# Patient Record
Sex: Male | Born: 1960 | Race: Black or African American | Hispanic: No | Smoking: Current every day smoker
Health system: Southern US, Community
[De-identification: ages and names within clinical notes are randomized; demographics above are authoritative.]

## PROBLEM LIST (undated history)

## (undated) ENCOUNTER — Emergency Department (HOSPITAL_BASED_OUTPATIENT_CLINIC_OR_DEPARTMENT_OTHER): Admission: EM | Payer: No Typology Code available for payment source | Source: Home / Self Care

## (undated) DIAGNOSIS — I10 Essential (primary) hypertension: Secondary | ICD-10-CM

## (undated) DIAGNOSIS — Z950 Presence of cardiac pacemaker: Secondary | ICD-10-CM

## (undated) DIAGNOSIS — Z888 Allergy status to other drugs, medicaments and biological substances status: Secondary | ICD-10-CM

## (undated) DIAGNOSIS — G4730 Sleep apnea, unspecified: Secondary | ICD-10-CM

## (undated) DIAGNOSIS — R972 Elevated prostate specific antigen [PSA]: Secondary | ICD-10-CM

## (undated) DIAGNOSIS — I428 Other cardiomyopathies: Secondary | ICD-10-CM

## (undated) DIAGNOSIS — I4719 Other supraventricular tachycardia: Secondary | ICD-10-CM

## (undated) DIAGNOSIS — I456 Pre-excitation syndrome: Secondary | ICD-10-CM

## (undated) DIAGNOSIS — K449 Diaphragmatic hernia without obstruction or gangrene: Secondary | ICD-10-CM

## (undated) DIAGNOSIS — K219 Gastro-esophageal reflux disease without esophagitis: Secondary | ICD-10-CM

## (undated) DIAGNOSIS — I441 Atrioventricular block, second degree: Secondary | ICD-10-CM

## (undated) DIAGNOSIS — I471 Supraventricular tachycardia: Secondary | ICD-10-CM

## (undated) HISTORY — DX: Pre-excitation syndrome: I45.6

## (undated) HISTORY — DX: Other supraventricular tachycardia: I47.19

## (undated) HISTORY — DX: Atrioventricular block, second degree: I44.1

## (undated) HISTORY — DX: Diaphragmatic hernia without obstruction or gangrene: K44.9

## (undated) HISTORY — DX: Allergy status to other drugs, medicaments and biological substances: Z88.8

## (undated) HISTORY — DX: Presence of cardiac pacemaker: Z95.0

## (undated) HISTORY — DX: Supraventricular tachycardia: I47.1

## (undated) HISTORY — DX: Essential (primary) hypertension: I10

## (undated) HISTORY — DX: Other cardiomyopathies: I42.8

---

## 1997-10-31 HISTORY — PX: PACEMAKER INSERTION: SHX728

## 1998-05-18 ENCOUNTER — Observation Stay (HOSPITAL_COMMUNITY): Admission: AD | Admit: 1998-05-18 | Discharge: 1998-05-19 | Payer: Self-pay | Admitting: Cardiology

## 1998-06-01 ENCOUNTER — Emergency Department (HOSPITAL_COMMUNITY): Admission: EM | Admit: 1998-06-01 | Discharge: 1998-06-01 | Payer: Self-pay | Admitting: Emergency Medicine

## 1998-08-14 ENCOUNTER — Emergency Department (HOSPITAL_COMMUNITY): Admission: EM | Admit: 1998-08-14 | Discharge: 1998-08-14 | Payer: Self-pay | Admitting: Emergency Medicine

## 1998-08-14 ENCOUNTER — Encounter: Payer: Self-pay | Admitting: Emergency Medicine

## 1998-12-03 ENCOUNTER — Emergency Department (HOSPITAL_COMMUNITY): Admission: EM | Admit: 1998-12-03 | Discharge: 1998-12-03 | Payer: Self-pay | Admitting: Emergency Medicine

## 1998-12-03 ENCOUNTER — Encounter: Payer: Self-pay | Admitting: Emergency Medicine

## 1999-02-09 ENCOUNTER — Emergency Department (HOSPITAL_COMMUNITY): Admission: EM | Admit: 1999-02-09 | Discharge: 1999-02-09 | Payer: Self-pay | Admitting: Emergency Medicine

## 1999-03-12 ENCOUNTER — Ambulatory Visit (HOSPITAL_BASED_OUTPATIENT_CLINIC_OR_DEPARTMENT_OTHER): Admission: RE | Admit: 1999-03-12 | Discharge: 1999-03-12 | Payer: Self-pay | Admitting: General Surgery

## 2001-04-20 ENCOUNTER — Ambulatory Visit (HOSPITAL_COMMUNITY): Admission: RE | Admit: 2001-04-20 | Discharge: 2001-04-20 | Payer: Self-pay | Admitting: Urology

## 2001-04-20 ENCOUNTER — Encounter: Payer: Self-pay | Admitting: Urology

## 2001-09-28 ENCOUNTER — Encounter: Payer: Self-pay | Admitting: Emergency Medicine

## 2001-09-28 ENCOUNTER — Emergency Department (HOSPITAL_COMMUNITY): Admission: EM | Admit: 2001-09-28 | Discharge: 2001-09-28 | Payer: Self-pay | Admitting: Emergency Medicine

## 2001-09-29 ENCOUNTER — Emergency Department (HOSPITAL_COMMUNITY): Admission: EM | Admit: 2001-09-29 | Discharge: 2001-09-29 | Payer: Self-pay | Admitting: Emergency Medicine

## 2001-10-03 ENCOUNTER — Encounter: Payer: Self-pay | Admitting: Emergency Medicine

## 2001-10-03 ENCOUNTER — Emergency Department (HOSPITAL_COMMUNITY): Admission: EM | Admit: 2001-10-03 | Discharge: 2001-10-03 | Payer: Self-pay | Admitting: Emergency Medicine

## 2001-11-19 ENCOUNTER — Encounter
Admission: RE | Admit: 2001-11-19 | Discharge: 2001-12-16 | Payer: Self-pay | Admitting: Physical Medicine and Rehabilitation

## 2001-12-27 ENCOUNTER — Encounter: Payer: Self-pay | Admitting: Physical Medicine and Rehabilitation

## 2001-12-27 ENCOUNTER — Encounter
Admission: RE | Admit: 2001-12-27 | Discharge: 2001-12-27 | Payer: Self-pay | Admitting: Physical Medicine and Rehabilitation

## 2002-03-29 ENCOUNTER — Encounter: Payer: Self-pay | Admitting: Orthopaedic Surgery

## 2002-04-05 ENCOUNTER — Encounter: Payer: Self-pay | Admitting: Orthopaedic Surgery

## 2002-04-05 ENCOUNTER — Inpatient Hospital Stay (HOSPITAL_COMMUNITY): Admission: RE | Admit: 2002-04-05 | Discharge: 2002-04-08 | Payer: Self-pay | Admitting: Orthopaedic Surgery

## 2002-04-07 ENCOUNTER — Encounter: Payer: Self-pay | Admitting: Orthopaedic Surgery

## 2002-05-22 ENCOUNTER — Encounter: Payer: Self-pay | Admitting: Orthopaedic Surgery

## 2002-05-22 ENCOUNTER — Ambulatory Visit (HOSPITAL_COMMUNITY): Admission: RE | Admit: 2002-05-22 | Discharge: 2002-05-22 | Payer: Self-pay | Admitting: Orthopaedic Surgery

## 2002-05-28 ENCOUNTER — Inpatient Hospital Stay (HOSPITAL_COMMUNITY): Admission: RE | Admit: 2002-05-28 | Discharge: 2002-06-01 | Payer: Self-pay | Admitting: Orthopaedic Surgery

## 2002-05-28 ENCOUNTER — Encounter: Payer: Self-pay | Admitting: Orthopaedic Surgery

## 2002-05-31 ENCOUNTER — Encounter: Payer: Self-pay | Admitting: Orthopaedic Surgery

## 2004-02-06 ENCOUNTER — Emergency Department (HOSPITAL_COMMUNITY): Admission: EM | Admit: 2004-02-06 | Discharge: 2004-02-06 | Payer: Self-pay | Admitting: Emergency Medicine

## 2004-03-18 ENCOUNTER — Inpatient Hospital Stay (HOSPITAL_BASED_OUTPATIENT_CLINIC_OR_DEPARTMENT_OTHER): Admission: RE | Admit: 2004-03-18 | Discharge: 2004-03-18 | Payer: Self-pay | Admitting: Cardiovascular Disease

## 2004-09-07 ENCOUNTER — Ambulatory Visit: Payer: Self-pay | Admitting: Internal Medicine

## 2004-09-08 ENCOUNTER — Ambulatory Visit: Payer: Self-pay | Admitting: Internal Medicine

## 2005-01-18 ENCOUNTER — Ambulatory Visit: Payer: Self-pay | Admitting: Internal Medicine

## 2005-01-24 ENCOUNTER — Ambulatory Visit: Payer: Self-pay | Admitting: Internal Medicine

## 2005-01-26 ENCOUNTER — Ambulatory Visit: Payer: Self-pay

## 2005-02-07 ENCOUNTER — Ambulatory Visit: Payer: Self-pay

## 2005-09-06 ENCOUNTER — Ambulatory Visit: Payer: Self-pay | Admitting: Internal Medicine

## 2005-11-07 ENCOUNTER — Ambulatory Visit: Payer: Self-pay | Admitting: Internal Medicine

## 2005-12-05 ENCOUNTER — Encounter: Payer: Self-pay | Admitting: Cardiology

## 2005-12-05 ENCOUNTER — Ambulatory Visit: Payer: Self-pay

## 2005-12-13 ENCOUNTER — Ambulatory Visit: Payer: Self-pay | Admitting: Internal Medicine

## 2009-10-31 HISTORY — PX: CARDIAC DEFIBRILLATOR PLACEMENT: SHX171

## 2010-02-10 ENCOUNTER — Telehealth (INDEPENDENT_AMBULATORY_CARE_PROVIDER_SITE_OTHER): Payer: Self-pay | Admitting: *Deleted

## 2010-03-02 ENCOUNTER — Ambulatory Visit: Payer: Self-pay | Admitting: Internal Medicine

## 2010-03-02 DIAGNOSIS — I440 Atrioventricular block, first degree: Secondary | ICD-10-CM | POA: Insufficient documentation

## 2010-03-02 DIAGNOSIS — I10 Essential (primary) hypertension: Secondary | ICD-10-CM | POA: Insufficient documentation

## 2010-03-02 DIAGNOSIS — Z950 Presence of cardiac pacemaker: Secondary | ICD-10-CM | POA: Insufficient documentation

## 2010-03-02 DIAGNOSIS — I429 Cardiomyopathy, unspecified: Secondary | ICD-10-CM | POA: Insufficient documentation

## 2010-03-05 ENCOUNTER — Telehealth: Payer: Self-pay | Admitting: Internal Medicine

## 2010-04-27 ENCOUNTER — Ambulatory Visit: Payer: Self-pay | Admitting: Cardiology

## 2010-04-27 ENCOUNTER — Ambulatory Visit (HOSPITAL_COMMUNITY): Admission: RE | Admit: 2010-04-27 | Discharge: 2010-04-27 | Payer: Self-pay | Admitting: Internal Medicine

## 2010-04-27 ENCOUNTER — Ambulatory Visit: Payer: Self-pay

## 2010-05-07 ENCOUNTER — Ambulatory Visit: Payer: Self-pay | Admitting: Internal Medicine

## 2010-05-07 DIAGNOSIS — I441 Atrioventricular block, second degree: Secondary | ICD-10-CM | POA: Insufficient documentation

## 2010-05-10 ENCOUNTER — Ambulatory Visit (HOSPITAL_COMMUNITY): Admission: RE | Admit: 2010-05-10 | Discharge: 2010-05-10 | Payer: Self-pay | Admitting: Internal Medicine

## 2010-05-10 ENCOUNTER — Ambulatory Visit: Payer: Self-pay | Admitting: Internal Medicine

## 2010-05-11 ENCOUNTER — Encounter: Payer: Self-pay | Admitting: Internal Medicine

## 2010-05-24 ENCOUNTER — Ambulatory Visit: Payer: Self-pay

## 2010-05-24 ENCOUNTER — Encounter: Payer: Self-pay | Admitting: Internal Medicine

## 2010-11-28 LAB — CONVERTED CEMR LAB
BUN: 9 mg/dL (ref 6–23)
Basophils Absolute: 0 10*3/uL (ref 0.0–0.1)
Basophils Relative: 0.4 % (ref 0.0–3.0)
CO2: 25 meq/L (ref 19–32)
Calcium: 8.9 mg/dL (ref 8.4–10.5)
Chloride: 110 meq/L (ref 96–112)
Creatinine, Ser: 0.9 mg/dL (ref 0.4–1.5)
Eosinophils Absolute: 0.1 10*3/uL (ref 0.0–0.7)
Eosinophils Relative: 0.9 % (ref 0.0–5.0)
GFR calc non Af Amer: 115.5 mL/min (ref >60)
Glucose, Bld: 99 mg/dL (ref 70–99)
HCT: 41.9 % (ref 39.0–52.0)
Hemoglobin: 14 g/dL (ref 13.0–17.0)
INR: 1 (ref 0.8–1.0)
Lymphocytes Relative: 44.5 % (ref 12.0–46.0)
Lymphs Abs: 3.7 10*3/uL (ref 0.7–4.0)
MCHC: 33.4 g/dL (ref 30.0–36.0)
MCV: 90.4 fL (ref 78.0–100.0)
Monocytes Absolute: 0.6 10*3/uL (ref 0.1–1.0)
Monocytes Relative: 7.3 % (ref 3.0–12.0)
Neutro Abs: 3.9 10*3/uL (ref 1.4–7.7)
Neutrophils Relative %: 46.9 % (ref 43.0–77.0)
Platelets: 275 10*3/uL (ref 150.0–400.0)
Potassium: 3.7 meq/L (ref 3.5–5.1)
Prothrombin Time: 10.5 s (ref 9.7–11.8)
RBC: 4.64 M/uL (ref 4.22–5.81)
RDW: 14.2 % (ref 11.5–14.6)
Sodium: 139 meq/L (ref 135–145)
WBC: 8.2 10*3/uL (ref 4.5–10.5)
aPTT: 26.5 s (ref 21.7–28.8)

## 2010-11-30 ENCOUNTER — Ambulatory Visit: Admit: 2010-11-30 | Payer: Self-pay | Admitting: Internal Medicine

## 2010-12-02 NOTE — Assessment & Plan Note (Signed)
Summary: per check out/saf   CC:  follow up.  Pt still reported some intermittent dizziness and SOB.  He also reports fatigue and some headaches.  History of Present Illness: [name] Fields was seen after a hiatus of 4 years a few months ago. He previously underwent ablation for WPW. He was followed at that time by Dr. Mayford Knife underwent  pacemaker implantation for high-grade heart block. He comes in today for device followup at his device is noncommunicative.  he continues to have dizzy spells.  We undertook an echo recently that demonstrated normal left ventricular function a significant improvement from his previously demonstrated 40%    Current Medications (verified): 1)  Lisinopril 10 Mg Tabs (Lisinopril) .... One By Mouth Daily  Allergies (verified): No Known Drug Allergies  Past History:  Past Medical History: Last updated: 03/02/2010  1. History of Wolfe-Parkinson-White syndrome.  2. Hiatal hernia.  Past Surgical History: Last updated: 03/02/2010  Pacemaker placement in 1999.  Family History: Last updated: 03/02/2010   Father had hypertension.  Mother had breast cancer.  Social History: Last updated: 03/02/2010  The patient is married, self-employed, smokes 1/2 packs of  cigarettes per day and is a social alcohol drinker.  Vital Signs:  Patient profile:   50 year old male Height:      73 inches Weight:      242 pounds BMI:     32.04 Pulse rate:   67 / minute Pulse rhythm:   regular BP sitting:   143 / 91  (left arm) Cuff size:   large  Vitals Entered By: [name] Fields CMA (May 07, 2010 9:17 AM)  Physical Exam  General:  The patient was alert and oriented in no acute distress. HEENT Normal.  Neck veins were flat, carotids were brisk.  Lungs were clear.  Heart sounds were regular without murmurs or gallops.  Abdomen was soft with active bowel sounds. There is no clubbing cyanosis or edema. Skin Warm and dry    Impression & Recommendations:  Problem  # 1:  SECOND DEGREE ATRIOVENTRICULAR HEART BLOCK (ICD-426.13) the patient has symptomatic second-degree heart block with lightheadedness likely to be able as well as fatigue. He is a previously implanted pacemaker that is healed well. We have discussed pacemaker generator replacement and potential risks including but not limited to infection. He understands these risks.  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 One by mouth daily  Problem # 2:  PACEMAKER, PERMANENT (ICD-V45.01) as above Orders: TLB-BMP (Basic Metabolic Panel-BMET) (80048-METABOL) TLB-CBC Platelet - w/Differential (85025-CBCD) TLB-PT (Protime) (85610-PTP) TLB-PTT (85730-PTTL)  Problem # 3:  CARDIOMYOPATHY, SECONDARY (ICD-425.9) there appears to be in normalization of his left ventricular systolic function (hooray ) we'll continue him on lisinopril His updated medication list for this problem includes:    Lisinopril 10 Mg Tabs (Lisinopril) ..... One by mouth daily  Orders: TLB-BMP (Basic Metabolic Panel-BMET) (80048-METABOL) TLB-CBC Platelet - w/Differential (85025-CBCD) TLB-PT (Protime) (85610-PTP) TLB-PTT (85730-PTTL)  Problem # 4:  AV BLOCK, 1ST DEGREE (ICD-426.11) as above His updated medication list for this problem includes:    Lisinopril 10 Mg Tabs (Lisinopril) ..... One by mouth daily  Orders: TLB-BMP (Basic Metabolic Panel-BMET) (80048-METABOL) TLB-CBC Platelet - w/Differential (85025-CBCD) TLB-PT (Protime) (85610-PTP) TLB-PTT (85730-PTTL)

## 2010-12-02 NOTE — Miscellaneous (Signed)
Summary: Device change out  Clinical Lists Changes  Observations: Added new observation of PPM INDICATN: CHB (05/11/2010 9:19) Added new observation of MAGNET RTE: BOL 100 ERI  85 (05/11/2010 9:19) Added new observation of PPMLEADSTAT2: active (05/11/2010 9:19) Added new observation of PPMLEADSER2: (05/11/2010 9:19) Added new observation of PPMLEADMOD2: 438-10 (05/11/2010 9:19) Added new observation of PPMLEADDOI2: 05/18/1998 (05/11/2010 9:19) Added new observation of PPMLEADLOC2: RV (05/11/2010 9:19) Added new observation of PPMLEADSTAT1: active (05/11/2010 9:19) Added new observation of PPMLEADSER1: (05/11/2010 9:19) Added new observation of PPMLEADMOD1: 438-10 (05/11/2010 9:19) Added new observation of PPMLEADDOI1: 05/18/1998 (05/11/2010 9:19) Added new observation of PPMLEADLOC1: RA (05/11/2010 9:19) Added new observation of PPM IMP MD: Sherryl Manges, MD (05/11/2010 9:19) Added new observation of PPM DOI: 05/10/2010 (05/11/2010 9:19) Added new observation of PPM SERL#: [id number]  (05/11/2010 9:19) Added new observation of PPM MODL#: [id number]  (05/11/2010 0:34) Added new observation of PACEMAKERMFG: St Jude  (05/11/2010 9:19) Added new observation of PACEMAKER MD: Sherryl Manges, MD  (05/11/2010 9:19)      PPM Specifications Following MD:  Sherryl Manges, MD     PPM Vendor:  St Jude     PPM Model Number:  [id number]     PPM Serial Number:  [id number] PPM DOI:  05/10/2010     PPM Implanting MD:  Sherryl Manges, MD  Lead 1    Location: RA     DOI: 05/18/1998     Model #: 438-10     Serial #:     Status: active Lead 2    Location: RV     DOI: 05/18/1998     Model #: 438-10     Serial #:     Status: active  Magnet Response Rate:  BOL 100 ERI  85  Indications:  CHB

## 2010-12-02 NOTE — Progress Notes (Signed)
Summary: PT WANTS TO DO PACER CHECK ON PHONE AGAIN  Phone Note Call from Patient Call back at [phone]   Caller: Patient Reason for Call: Talk to Nurse, Talk to Doctor Summary of Call: PT WANTS TO START DOING HIS PACER CHECKS OVER THE PHONE AGAIN AND NEEDS TO KNOW WHAT HE NEEDS TO DO TO GET THE PROCESS STARTED Initial call taken by: Omer Jack,  February 10, 2010 1:26 PM  Follow-up for Phone Call        Schedulers are to call this gentleman with an appointment to see either Dr. Graciela Husbands or Dr. Ladona Ridgel in [place] to re-establish.  He has not been seen since 2007, Follow-up by: Altha Harm, LPN,  February 10, 2010 1:58 PM

## 2010-12-02 NOTE — Assessment & Plan Note (Signed)
Summary: pc2/jss   Visit Type:  pc2   History of Present Illness: [name] Fields is seen after a hiatus of 4 years. We have seen him years ago at which time he underwent ablation for WPW. He was followed at that time by Dr. Mayford Knife who undergo pacemaker implantation for high-grade heart block. He comes in today for device followup at his device is noncommunicative. He has had 3 dizzy spells which have been associated with warmth.    He has complaints of dyspnea on exertion. He undergone catheterization in 2007 by Dr. admission demonstrating non-obstructive coronary disease and a cardiomyopathy his ejection fraction in the 40s. This is attributed to hot long-standing hypertension.  Current Medications (verified): 1)  None  Allergies (verified): No Known Drug Allergies  Past History:  Past Medical History: hypertension Nonischemic cardiomyopathy Obesity first degree AV block and intermittent high-grade heart block Status post pacemaker (Dr. Mayford Knife) WPWstatus post ablation  Past Surgical History: pacemaker  Social History: partially disabled   Review of Systems       full review of systems was negative apart from a history of present illness and past medical history apart from stress anxiety and nowresolving depression   Vital Signs:  Patient profile:   50 year old male Height:      73 inches Weight:      245.75 pounds BMI:     32.54 Pulse rate:   78 / minute Pulse rhythm:   regular Resp:     18 per minute BP sitting:   149 / 97  (right arm) Cuff size:   large  Vitals Entered By: Vikki Ports (Mar 02, 2010 12:47 PM)  Physical Exam  General:  Alert and oriented in no acute distress. HEENT  normal . Neck veins were flat; carotids brisk and full without bruits. No lymphadenopathy. Back without kyphosis. Lungs clear. Heart sounds regular without murmurs or gallops. PMI nondisplaced. Abdomen soft with active bowel sounds without midline pulsation or hepatomegaly. Femoral  pulses and distal pulses intact. Extremities were without clubbing cyanosis or edemaSkin warm and dry. Neurological exam grossly normal;  affect is engaging and depression was denied    Impression & Recommendations:  Problem # 1:  AV BLOCK, 1ST DEGREE (ICD-426.11) the patient has first degree AV block. He also has dyspnea on exertion. It is not clear to me whether the lateral abnormality and or the structural/hemodynamic issues related to his hypertensive heart disease are mostly contributing. We will need to elucidate this; we'll begin with a 2-D echo. His updated medication list for this problem includes:    Lisinopril 10 Mg Tabs (Lisinopril) ..... One by mouth daily  Problem # 2:  CARDIOMYOPATHY, SECONDARY (ICD-425.9) as above His updated medication list for this problem includes:    Lisinopril 10 Mg Tabs (Lisinopril) ..... One by mouth daily  Orders: Echocardiogram (Echo)  Problem # 3:  HYPERTENSIVE CARDIOVASCULAR DISEASE (ICD-402.90) we will begin him on lisinopril at this time. When he comes in for his echo we'll need to check a metabolic profile. No laboratories are available and the last few years according to [facility] records or EMR His updated medication list for this problem includes:    Lisinopril 10 Mg Tabs (Lisinopril) ..... One by mouth daily  Orders: Echocardiogram (Echo)  Problem # 4:  PACEMAKER, PERMANENT (ICD-V45.01) his pacemaker has lost all of his energy. It is not a device with which we can communicate. His tinnitus undoubtedly for some time. The patient has had a couple  of episodes of lightheadedness. It is [place] reasonable to consider reimplantation. Next issue will be does he have left ventricular function issues sufficient to justify ICD implantation if they were to persist following reinitiation of medical therapy. We will plan to revisit this following his echo  Patient Instructions: 1)  Your physician recommends that you schedule a follow-up appointment in: 2-3  weeks with Dr Graciela Husbands 2)  Your physician has recommended you make the following change in your medication: start Lisinopril 10mg  daily  3)  Your physician has requested that you have an echocardiogram.  Echocardiography is a painless test that uses sound waves to create images of your heart. It provides your doctor with information about the size and shape of your heart and how well your heart's chambers and valves are working.  This procedure takes approximately one hour. There are no restrictions for this procedure. Prescriptions: LISINOPRIL 10 MG TABS (LISINOPRIL) one by mouth daily  #30 x 11   Entered by:   Dennis Bast, RN, BSN   Authorized by:   Nathen May, MD, [facility]   Signed by:   Dennis Bast, RN, BSN on 03/02/2010   Method used:   Electronically to        [facility]  [phone]* (retail)       [address]       [place], [place]  [address]       Ph: [phone] or [phone]       Fax: [phone]   RxID:   [id number]

## 2010-12-02 NOTE — Procedures (Signed)
Summary: wh per Dr. Graciela Husbands call/g   Current Medications (verified): 1)  Lisinopril 10 Mg Tabs (Lisinopril) .... One By Mouth Daily  Allergies (verified): No Known Drug Allergies  PPM Specifications Following MD:  Sherryl Manges, MD     PPM Vendor:  St Jude     PPM Model Number:  [id number]     PPM Serial Number:  [id number] PPM DOI:  05/10/2010     PPM Implanting MD:  Sherryl Manges, MD  Lead 1    Location: RA     DOI: 05/18/1998     Model #: 438-10     Serial #:     Status: active Lead 2    Location: RV     DOI: 05/18/1998     Model #: 438-10     Serial #:     Status: active  Magnet Response Rate:  BOL 100 ERI  85  Indications:  CHB   PPM Follow Up Remote Check?  No Battery Voltage:  3.17 V     Battery Est. Longevity:  9.9 years     Pacer Dependent:  No       PPM Device Measurements Atrium  Amplitude: 4.0 mV, Impedance: 360 ohms, Threshold: 0.5 V at 0.5 msec Right Ventricle  Amplitude: 11.4 mV, Impedance: 350 ohms, Threshold: 0.75 V at 0.5 msec  Episodes MS Episodes:  0     Percent Mode Switch:  0     Coumadin:  No Ventricular High Rate:  2     Atrial Pacing:  2.5%     Ventricular Pacing:  99%  Parameters Mode:  DDD     Lower Rate Limit:  60     Upper Rate Limit:  130 Paced AV Delay:  180     Sensed AV Delay:  150 Next Cardiology Appt Due:  07/31/2010 Tech Comments:  Steri-strips removed by the patient, no redness or edema noted.  ROV 3 months with Dr. Graciela Husbands. Altha Harm, LPN  May 24, 2010 10:28 AM

## 2010-12-02 NOTE — Cardiovascular Report (Signed)
Summary: Office Visit   Office Visit   Imported By: Roderic Ovens 05/28/2010 10:22:01  _____________________________________________________________________  External Attachment:    Type:   Image     Comment:   External Document

## 2010-12-02 NOTE — Progress Notes (Signed)
Summary: medication question  Phone Note Call from Patient Call back at Home Phone [phone]   Caller: Patient Summary of Call: Pt have question about Lisinopril Initial call taken by: Judie Grieve,  Mar 05, 2010 2:52 PM  Follow-up for Phone Call        pt asking if taking Lisinopril may affect his sex drive--I told pt usually  this was not a side effect of this category of medications

## 2010-12-16 ENCOUNTER — Encounter (INDEPENDENT_AMBULATORY_CARE_PROVIDER_SITE_OTHER): Payer: Self-pay | Admitting: Internal Medicine

## 2010-12-16 ENCOUNTER — Encounter: Payer: Self-pay | Admitting: Internal Medicine

## 2010-12-16 DIAGNOSIS — I471 Supraventricular tachycardia, unspecified: Secondary | ICD-10-CM

## 2010-12-16 DIAGNOSIS — R42 Dizziness and giddiness: Secondary | ICD-10-CM

## 2010-12-16 DIAGNOSIS — I10 Essential (primary) hypertension: Secondary | ICD-10-CM

## 2010-12-16 DIAGNOSIS — I495 Sick sinus syndrome: Secondary | ICD-10-CM

## 2010-12-16 DIAGNOSIS — Z950 Presence of cardiac pacemaker: Secondary | ICD-10-CM

## 2010-12-28 NOTE — Cardiovascular Report (Signed)
Summary: Office Visit   Office Visit   Imported By: Roderic Ovens 12/22/2010 16:16:42  _____________________________________________________________________  External Attachment:    Type:   Image     Comment:   External Document

## 2010-12-28 NOTE — Assessment & Plan Note (Signed)
Summary: pacer/office visit ck/mt   Visit Type:  Pacer check   History of Present Illness: [name] is seen in followup for high-grade heart block for which he is status post pacemaker insertion with generator replacement last year   He previously underwent ablation for WPW   We undertook an echo recently that demonstrated normal left ventricular function a significant improvement from his previously demonstrated 40%  he continues to have some dizzy spells. Otherwise he denies chest pain or shortness of breath or peripheral edema    Current Medications (verified): 1)  Lisinopril 10 Mg Tabs (Lisinopril) .... One By Mouth Daily  Allergies (verified): No Known Drug Allergies  Past History:  Past Medical History: Last updated: 08/23/2010  1. History of Wolfe-Parkinson-White syndrome.  2. Hiatal hernia.  3. Implanted-St. Jude Accent RF pulse generator, model [id number]  Family History: Reviewed history from 03/02/2010 and no changes required.   Father had hypertension.  Mother had breast cancer.  Social History: Reviewed history from 03/02/2010 and no changes required.  The patient is married, self-employed, smokes 1/2 packs of  cigarettes per day and is a social alcohol drinker.  Vital Signs:  Patient profile:   50 year old male Height:      73 inches Weight:      254.50 pounds BMI:     33.70 Pulse rate:   75 / minute Pulse rhythm:   regular Resp:     18 per minute BP sitting:   146 / 95  (right arm) Cuff size:   large  Vitals Entered By: Vikki Ports (December 16, 2010 12:18 PM)  Physical Exam  General:  The patient was alert and oriented in no acute distress. HEENT Normal.  Neck veins were flat, carotids were brisk.  Lungs were clear.  Heart sounds were regular without murmurs or gallops.  Abdomen was soft with active bowel sounds. There is no clubbing cyanosis or edema. Skin Warm and dry device pocket is well-healed   PPM Specifications Following MD:   Sherryl Manges, MD     PPM Vendor:  St Jude     PPM Model Number:  [id number]     PPM Serial Number:  [id number] PPM DOI:  05/10/2010     PPM Implanting MD:  Sherryl Manges, MD  Lead 1    Location: RA     DOI: 05/18/1998     Model #: 438-10     Serial #:     Status: active Lead 2    Location: RV     DOI: 05/18/1998     Model #: 438-10     Serial #:     Status: active  Magnet Response Rate:  BOL 100 ERI  85  Indications:  CHB   PPM Follow Up Pacer Dependent:  No      Episodes Coumadin:  No  Parameters Mode:  DDD     Lower Rate Limit:  60     Upper Rate Limit:  130 Paced AV Delay:  180     Sensed AV Delay:  150  Impression & Recommendations:  Problem # 1:  DIZZINESS (ICD-780.4) the patient has episodes of dizziness. His pacemaker also has evidence of ventricular high rate episodes. The m evaluation demonstrates that these are supraventricular origin. They are long RP in mechanism. However, the atrial electrogram is different from sinus suggesting an alternative origin. It is not clear whether these correlate with his dizziness. We will plan to review this. He will make  a dizziness lock and will compare with his arrhythmia log  Problem # 2:  SVT (ICD-427.0) as above; we will add diltiazem to his medical regime both for his blood pressure as well as for his tachycardia. His updated medication list for this problem includes:    Lisinopril 10 Mg Tabs (Lisinopril) ..... One by mouth daily    Cardizem La 120 Mg Xr24h-tab (Diltiazem hcl coated beads) .Marland Kitchen... 1  once daily  Problem # 3:  HYPERTENSIVE CARDIOVASCULAR DISEASE (ICD-402.90) his blood pressure remains elevated. We'll add a calcium blocker. It may well be a beta blocker is better we'll try this first. His updated medication list for this problem includes:    Lisinopril 10 Mg Tabs (Lisinopril) ..... One by mouth daily    Cardizem La 120 Mg Xr24h-tab (Diltiazem hcl coated beads) .Marland Kitchen... 1  once daily  Problem # 4:  PACEMAKER,  PERMANENT (ICD-V45.01) Device parameters and data were reviewed and no changes were made  Patient Instructions: 1)  Your physician recommends that you schedule a follow-up appointment in: 3 MONTHS WITH DR Graciela Husbands 2)  Your physician has recommended you make the following change in your medication: START CARDIZEM 120 MG once daily  Prescriptions: CARDIZEM LA 120 MG XR24H-TAB (DILTIAZEM HCL COATED BEADS) 1  once daily  #30 x 11   Entered by:   Scherrie Bateman, LPN   Authorized by:   Nathen May, MD, [facility]   Signed by:   Scherrie Bateman, LPN on 14/78/2956   Method used:   Electronically to        [facility]  [phone]* (retail)       [address]       [place], [place]  [address]       Ph: [phone] or [phone]       Fax: [phone]   RxID:   [id number]

## 2011-01-04 ENCOUNTER — Telehealth: Payer: Self-pay | Admitting: Internal Medicine

## 2011-01-11 NOTE — Progress Notes (Signed)
Summary: pt has a medication question  Phone Note Call from Patient Call back at Home Phone [phone]   Caller: Patient Reason for Call: Talk to Nurse, Talk to Doctor Summary of Call: pt has a medication question Initial call taken by: Omer Jack,  January 04, 2011 1:38 PM  Follow-up for Phone Call        [facility] Scherrie Bateman, LPN  January 04, 980 5:26 PM  PT COMPLAINING NEW MED IS BOTHERING "SEX LIFE" PT AWARE WILL DISCUSS WITH DR Graciela Husbands. Follow-up by: Scherrie Bateman, LPN,  January 05, 2011 9:01 AM  Additional Follow-up for Phone Call Additional follow up Details #1::        have him stop and let us know  It was diltizam Additional Follow-up by: Nathen May, MD, [facility],  January 05, 2011 6:36 PM     Appended Document: pt has a medication question PT AWARE .Zack Seal

## 2011-01-16 LAB — SURGICAL PCR SCREEN: Staphylococcus aureus: POSITIVE — AB

## 2011-03-14 ENCOUNTER — Encounter: Payer: Self-pay | Admitting: Internal Medicine

## 2011-03-18 ENCOUNTER — Encounter: Payer: Self-pay | Admitting: Internal Medicine
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
## 2011-03-18 NOTE — H&P (Signed)
NAME:  [name] [name], [name] [name] NO.:  [id number]   MEDICAL RECORD NO.:  [id number]                   PATIENT TYPE:  INP   LOCATION:  0345                                 FACILITY:  [facility]   PHYSICIAN:  Patricia Nettle, M.D.                  DATE OF BIRTH:  Dec 04, 1960   DATE OF ADMISSION:  05/28/2002  DATE OF DISCHARGE:  06/01/2002                                HISTORY & PHYSICAL   CHIEF COMPLAINT:  Neck pain.   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  On a followup visit he was seen  by Dr. Sharolyn Douglas, and at his six week visit he was noted on x-ray to have  substance of the thecal strut graft.  Due to these findings, a posterior  cervical effusion with bilateral screws and interspinous wiring.  Supplemental posterior allograft bone graft was recommended.  The risks and  benefits were discussed with the patient in great detail, and he wished to  proceed with operative intervention.   ALLERGIES:  No known drug allergies.   MEDICATIONS:  1. __________1 mg.  2. Pain medication and muscle relaxor from previous surgery.   PAST MEDICAL HISTORY:  1. History of Wolfe-Parkinson-White syndrome.  2. Hiatal hernia.   PAST SURGICAL HISTORY:  Pacemaker placement in 1999.   FAMILY HISTORY:  Father had hypertension.  Mother had breast cancer.   SOCIAL HISTORY:  The patient is married, self-employed, smokes 1/2 packs of  cigarettes per day and is a social alcohol drinker.   REVIEW OF SYMPTOMS:  GENERAL:  He denies any fevers, chills, or night  sweats.  HEENT:  Denies blurred or double vision.  CHEST:  He denies any  hemoptysis or productive cough.  CARDIOVASCULAR:  He denies any type of  chest pains, orthopnea.  GI/GU:  He denies nausea, vomiting, diarrhea,  constipation, melena, or bloody stools.  MUSCULOSKELETAL:  He  states he does  have some neck pain as expected after the surgery which radiates into the  left arm at times when he is trying to pull it from a laying position.   PHYSICAL EXAMINATION:  VITAL SIGNS:  Blood pressure 134/90, pulse 92,  respiratory rate 16, temperature 99.1.  HEENT:  Head is normocephalic, atraumatic.  CHEST:  Clear to auscultation.  No wheezing or rhonchi.  ABDOMEN:  Soft, bowel sounds positive, no guarding, no rebound.  EXTREMITIES:  Including neck examination, the patient remains neurologically  intact.  He has no focal deficits.  Preoperative symptoms as listed in H&P  on 04/22/02, have resolved.  Status post the corpectomies, and he is not  having any dysphagia.   ASSESSMENT:  1. Status post C4-5 corpectomy with fibular strut graft for spinal stenosis  with substance of the fibular strut graft.  2. History of Wolfe-Parkinson-White syndrome with pacemaker placement.  3. History of hiatal hernia.   PLAN:  The patient will be admitted to [facility] and will undergo  a posterior cervical fusion and bilateral screws, bilateral wiring,  supplemented with posterior iliac crest bone graft per Dr. Sharolyn Douglas.  This  has been discussed with the patient in great detail.      Arlys Kameron L. Wynona Neat, P.A.-C.                Patricia Nettle, M.D.    EAV/WUJW  D:  07/06/2002  T:  07/06/2002  Job:  [id number]

## 2011-03-18 NOTE — Discharge Summary (Signed)
NAME:  [name] [name], [name] [name] NO.:  [id number]   MEDICAL RECORD NO.:  [id number]                   PATIENT TYPE:   LOCATION:                                       FACILITY:   PHYSICIAN:  [name] Douglas, MD                       DATE OF BIRTH:  [birth year]   DATE OF ADMISSION:  05/28/2002  DATE OF DISCHARGE:  06/01/2002                                 DISCHARGE SUMMARY   PRIMARY DIAGNOSES:  1. Status post C4-5 anterior cervical corpectomy with fibular strut graft     and plate.  2. 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  3. History of arrhythmia with pacemaker.   SECONDARY DIAGNOSES:  None.   PROCEDURE:  1. Posterior spinal fusion C3-C6.  2. Posterior cervical instrumentation, segmental, C3-C6 with lateral mass     screws and interspinous wiring.  3. Left posterior iliac crest bone graft, morselized.  4. Neural monitoring utilizing SSEPs free running and trigger EMGs.  5. Allograft.  6. Placement and removal of Mayfield tongs all performed by Dr. Noel [name] with     the assistance of [name] [name], P.A.-C. on May 28, 2002.  Please see     operative summary for further details.   CONSULTATIONS:  [name].   LABORATORY DATA:  EKG from July 25 showed normal sinus rhythm with a first  degree AV block, electronic pacemaker.  Urinalysis preoperatively showed  rare epithelial cells, negative nitrite, negative leukocyte esterase, 0-2  red blood cells, 0-2 white blood cells.  H&H preoperative of 14.4 and 41.8,  respectively.  PT/INR and PTT were all within normal limits.  Routine  chemistry preoperatively as well as postoperatively were all within normal  parameters with the exception of a slightly elevated glucose of 138 at  preoperative and then a BUN of 4 postoperative.   CHIEF COMPLAINT:  Neck pain.   HISTORY OF PRESENT ILLNESS:  The patient is a 50 year old male who is seven  weeks status post C4-5  corpectomy who is noted to have on x-ray __________  of the fibular strut on graft.  It was recommended he undergo repeat surgery  on his neck.  He is admitted on July 29 for surgical intervention.   HOSPITAL COURSE:  Following surgical procedure patient was taken to the PACU  in stable condition, transferred to the ICU in stable condition.  He was  then transferred to telemetry floor on postoperative day one.  Wound was  examined on postoperative day two and subsequently found to be clean, dry,  and intact without signs or symptoms of infection.  __________ received  routine postoperative pain medications and antibiotics.  [facility]  was consulted for concurrent medical care.  The patient did well during his  hospital stay and did well with physical therapy.  By postoperative day four  on June 01, 2002 he was stable and ready for discharge home.   DISPOSITION:  The patient is being discharged home.  He is to follow a  regular diet.  Follow up with Dr. Noel [name] in 10-14 days.  He may shower with  the [place] collar once daily.  Where the Aspen collar at all other  times.   DISCHARGE MEDICATIONS:  1. Percocet 5/325 one or two q.4-6h. as needed for pain.  2. Robaxin 500 mg p.o. q.8h. p.r.n. spasm.  3. Over-the-counter stool softener.   CONDITION ON DISCHARGE:  Good and improved.     [name] [name], P.A.-C.              [name] Douglas, MD    ADT/MEDQ  D:  08/07/2002  T:  08/07/2002  Job:  [id number]

## 2011-03-18 NOTE — Op Note (Signed)
TNAMERECO, SHONK NO.:  [id number]   MEDICAL RECORD NO.:  [id number]                   PATIENT TYPE:  INP   LOCATION:  0345                                 FACILITY:  [facility]   PHYSICIAN:  Patricia Nettle, M.D.                  DATE OF BIRTH:  06-28-1961   DATE OF PROCEDURE:  05/28/2002  DATE OF DISCHARGE:                                 OPERATIVE REPORT   DIAGNOSES:  1. Status post C4 and C5 anterior cervical corpectomy with fibular strut     graft and plate.  2. Ossification of posterior longitudinal ligature and cervical spinal     stenosis.  3. History of arrhythmia with pacemaker.   PROCEDURE:  1. Posterior spinal fusion, C3-C6.  2. Posterior cervical instrumentation, segmental, C3-C6 with lateral mass     screws and interspinous wiring.  3. Left posterior iliac crest bone graft, morselized.  4. Neuromonitoring utilizing SSEPs, free running, and trigger EMGs.  5. Allograft.  6. Placement and removal of Mayfield tongs.   SURGEON:  Patricia Nettle, M.D.   ASSISTANT:  Ottie Glazier. Wynona Neat, P.A.-C.   ESTIMATED BLOOD LOSS:  100 cc.   COMPLICATIONS:  None.   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  Last week on his six week  follow-up he was noted on x-ray to have subsidence of the fibular strut  graft.  Posterior cervical fusion with bilateral mass screws and  interspinous wiring supplemented with posterior iliac crest bone graft was  recommended.  The risks, benefits, and alternatives were reviewed with the  patient extensively and he elected to proceed.  A preoperative CT scan was  obtained which showed subsidence of the graft inferiorly into the C6 body  without penetration into the C6-7 disk space.  Although there had been some  change in position of the anterior plate, the CT scan  showed the screws to  be well fixed in bone.  The patient's neurologic exam remained nonfocal.  His preoperative symptoms before the corpectomies have resolved.  He is not  having any dysphagia.   DESCRIPTION OF PROCEDURE:  The patient was properly identified in the  holding area, taken to the operating room where he underwent general  endotracheal anesthesia without difficulty.  We avoided hyperextension of  his neck.  He was given prophylactic IV antibiotics.  The scalp was prepped  in a standard fashion and then the Mayfield tongs were placed and tightened  to 60 pounds.  Neuromonitoring was established in the form of SSEPs as well  as free-running EMGs.  The patient was then turned prone and carefully  positioned on a [employer] four-poster positioning frame.  The Mayfield was  attached to the bed.  We positioned  his neck in neutral.  Fluoroscopy was  utilized to confirm acceptable positioning and if the anterior graft were in  place.  We then prepped the neck and left iliac crest in a standard fashion.  An 8 cm incision was made from C2-C7.  Dissection was carried down to the  deep fascia.  The dissection was taken down to the spinous processes by  staying within the nuchal ligament.  The paraspinous muscles were then  stripped out to the lateral masses, exposing C3, 4, 5, and 6.  Care was  taken to protect the C2-3 and C6-7 facette capsules.  Deep retractors were  placed.  At this point the facette joints were curetted of cartilage on  their posterior one-thirds.  We then used a burr to decorticate the  facettes.  Lateral mass screws were then placed using a standard technique  by first using the awl and then the high-speed drill.  A depth gauge was  used.  We achieved bicortical fixation at C3, unicortical fixation at C4-5,  and then bicortical fixation again at C6.  We placed 12 and 14 mm Summit  polyaxial screws.   At this point attention was directed to the left posterior iliac  crest.  The  3 cm incision was made directly over the crest.  Dissection was carried down  to the deep fascia.  The deep fascia was incised and the PSIS was exposed.  We used a Lexxel to remove the cap and then used  curettes to remove copious  amounts of autologous iliac crest cancellous graft.  The wound was  irrigated.  Gelfoam was placed in the defect.  The deep fascia was closed  with a running #1 Vicryl suture followed by 2-0 Vicryl in the skin and then  a  4-0 subcuticular Vicryl, Benzoin, and Steri-Strips.   Attention was directed back to the neck.  We packed autologous iliac crest  bone graft into the facette joint at C3-4, C4-5, and C5-6 bilaterally.  We  then placed rods and appropriate locking caps.  At this point we used the  high-speed burr to decorticate the lamina at C-3, 4, 5, and 6, as well as  the lateral masses.  We then packed the remaining bone graft tightly on top  of the lamina and over the lateral masses between the screws.  At this point  a burr was used to make a hole at the spinolaminar junction of C3.  We  connected this with a towel clip.  We then took a Songer cable, titanium,  and wired this through with a coil in the C3 spinous process and then around  the C6 spinous process through the interspinous ligament between C6 and C7.  We then tensioned this to 12 pounds of torque and crimped.  We then packed  10 cc of AlloMatrix allograft over the spinous processes and lamina.  At  this point the wound was irrigated.  We closed the muscle and fascia using  interrupted #1 Vicryl suture.  The subcutaneous layer was closed using 2-0  interrupted Vicryl and then the subcuticular layer closed with a running 4-0  subcuticular Vicryl suture.  We placed Benzoin and  Steri-Strips.  Before closing the fascia a deep Hemovac drain was placed.  It should also be noted that throughout the procedure there were no deleterious changes in the free-running EMGs or the SSEPs.  After  placing  the lateral mass screws we performed triggered EMGs confirming intraosseous  placement of the  screws without contact of the exiting roots.  A sterile  dressing was applied.  The patient was carefully turned supine.  The  Mayfield tongs were removed.  The pin sites were inspected.  There was no  bleeding.  His Aspen collar was then placed.  We took a lateral radiograph  showing good position of the hardware C3-C6 with no change in the anterior  fibular strut or plate.  He had good strength in his upper and lower  extremities and was transferred to the recovery room in stable condition.                                               Patricia Nettle, M.D.    MWC/MEDQ  D:  05/28/2002  T:  05/31/2002  Job:  [id number]

## 2011-03-18 NOTE — Cardiovascular Report (Signed)
NAME:  [name] [name], [name] [name] NO.:  [id number]   MEDICAL RECORD NO.:  [id number]                   PATIENT TYPE:  AMB   LOCATION:                                       FACILITY:  [facility]   PHYSICIAN:  Charlton Haws, M.D.                  DATE OF BIRTH:  [birth year]   DATE OF PROCEDURE:  03/18/2004  DATE OF DISCHARGE:                              CARDIAC CATHETERIZATION   PROCEDURE:  Left coronary angiography.   INDICATIONS FOR PROCEDURE:  Chest pain in an individual with decreased LV  function by Cardiolite at 43%.  Chronic left bundle branch block, rule out  cardiomyopathy of an ischemic nature.   ANGIOGRAPHY:  The patient was sedated with 3 mg of Versed and  catheterization was done from the right femoral artery.   1. The left main coronary artery was normal.  2. Left anterior descending artery was normal.  3. Circumflex coronary artery was normal.  4. Right coronary artery was normal.  5. There is no evidence of critical coronary disease.  .   VENTRICULOGRAM:  RAO ventriculography showed an LV pressure of 145/11 and an  aortic pressure of 142/90.  There was mild global hypokinesis with normal  mural thrombus.  There was no significant MR.  Ejection fraction was  estimated at 45%.   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  He will follow up with Dr. Graciela Husbands and Dr.  Posey Rea.  His pacemaker is working well. He is status post ablation for  WPW.  Consideration for increasing his current Diovan dose will be given in  the future.   The patient will be discharged later today so long as his leg heals well.                                               Charlton Haws, M.D.    PN/MEDQ  D:  03/18/2004  T:  03/19/2004  Job:  [id number]   cc:   Duke Salvia, M.D.   Dr. Charm Barges
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
## 2011-03-18 NOTE — Op Note (Signed)
[facility]  Patient:    ARIV, PENROD Visit Number: [id number] MRN: [id number]          Service Type: SUR Location: 4W 0446 01 Attending Physician:  Patricia Nettle Proc. Date: 04/05/02 Admit Date:  04/05/2002 Discharge Date: 04/08/2002                             Operative Report  ADDENDUM:  It should be noted that at the completion of the spinal cord decompression, the dura was carefully evaluated and there were no dural tears. Nevertheless, we created a barrier with a small piece of Duragen, carefully contouring this and laying it over the exposed epidural space.  I also failed to mention that after the strut graft was placed, we had the anesthesiologist flex and extend the neck and directly observe the graft.  It was found to be stable and there was no tendency for it to displace. Attending Physician:  Patricia Nettle. DD:  04/06/02 TD:  04/09/02 Job: [id number]
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
## 2011-04-13 ENCOUNTER — Encounter: Payer: Self-pay | Admitting: Internal Medicine

## 2011-04-26 ENCOUNTER — Encounter: Payer: Self-pay | Admitting: Internal Medicine

## 2011-06-01 ENCOUNTER — Encounter: Payer: Self-pay | Admitting: Internal Medicine

## 2011-06-01 ENCOUNTER — Other Ambulatory Visit: Payer: Self-pay | Admitting: Internal Medicine

## 2011-06-01 ENCOUNTER — Ambulatory Visit (INDEPENDENT_AMBULATORY_CARE_PROVIDER_SITE_OTHER): Payer: Self-pay | Admitting: Internal Medicine

## 2011-06-01 DIAGNOSIS — R0602 Shortness of breath: Secondary | ICD-10-CM

## 2011-06-01 DIAGNOSIS — R079 Chest pain, unspecified: Secondary | ICD-10-CM | POA: Insufficient documentation

## 2011-06-01 DIAGNOSIS — I440 Atrioventricular block, first degree: Secondary | ICD-10-CM

## 2011-06-01 DIAGNOSIS — Z950 Presence of cardiac pacemaker: Secondary | ICD-10-CM

## 2011-06-01 DIAGNOSIS — I429 Cardiomyopathy, unspecified: Secondary | ICD-10-CM

## 2011-06-01 DIAGNOSIS — I441 Atrioventricular block, second degree: Secondary | ICD-10-CM

## 2011-06-01 MED ORDER — LISINOPRIL 10 MG PO TABS: 10.0000 mg | ORAL_TABLET | Freq: Every day | ORAL | Status: DC

## 2011-06-01 MED ORDER — VERAPAMIL HCL ER 180 MG PO TBCR: 180.0000 mg | EXTENDED_RELEASE_TABLET | Freq: Every day | ORAL | Status: DC

## 2011-06-01 NOTE — Assessment & Plan Note (Signed)
The patient's device was interrogated.  The information was reviewed. No changes were made in the programming.  Attempts were made to increase  AV delay, but he persists in pacing even at 250 msec

## 2011-06-01 NOTE — Progress Notes (Signed)
  HPI  [name] Fields is a 50 y.o. male e previously underwent ablation for WPW. He was followed at that time by Dr. Mayford Knife and underwent pacemaker implantation for high-grade heart block. His pacemaker generator was replaced July 2011. He's had occasional issues with palpitations since then. There has been no syncope. .  We undertook an echo recently that demonstrated normal left ventricular function a significant improvement from his previously demonstrated 40%   He also has been complaining of recently identified significant shortness of breath associated with ejaculation. This is the most physically active he is. It is unassociated with chest pain.   Past Medical History  Diagnosis Date  . WPW (Wolff-Parkinson-White syndrome)     s/p ICD  . Hiatal hernia     Past Surgical History  Procedure Date  . Pacemaker insertion 1999    explanted 2011  . Cardiac defibrillator placement 2011    [employer] Jude -- [employer] RF    Current Outpatient Prescriptions  Medication Sig Dispense Refill  . lisinopril (PRINIVIL,ZESTRIL) 10 MG tablet Take 10 mg by mouth daily.          Allergies no known allergies  Review of Systems negative except from HPI and PMH  Physical Exam Well developed and well nourished in no acute distress HENT normal E scleral and icterus clear Neck Supple Device pocket well-healed Clear to ausculation Regular rate and rhythm, no murmurs gallops or rub Soft with active bowel sounds No clubbing cyanosis and edema Alert and oriented, grossly normal motor and sensory function Skin Warm and Dry   Assessment and  Plan

## 2011-06-01 NOTE — Patient Instructions (Addendum)
Your physician has recommended you make the following change in your medication:  1) Start Verapamil 180mg  one tablet by mouth once daily.  Your physician has requested that you have en exercise stress myoview. For further information please visit [email or web address] Please follow instruction sheet, as given.  Remote monitoring is used to monitor your Pacemaker of ICD from home. This monitoring reduces the number of office visits required to check your device to one time per year. It allows [place] to keep an eye on the functioning of your device to ensure it is working properly. You are scheduled for a device check from home on 09/01/11. You may send your transmission at any time that day. If you have a wireless device, the transmission will be sent automatically. After your physician reviews your transmission, you will receive a postcard with your next transmission date.  Your physician wants you to follow-up in: 1 year with Dr. Graciela Husbands. You will receive a reminder letter in the mail two months in advance. If you don't receive a letter, please call our office to schedule the follow-up appointment.

## 2011-06-01 NOTE — Assessment & Plan Note (Signed)
Concern is whether this represents a anginal equivalent. He is essentially 99% atrially sensed; and thus should not be an arrhythmia.  Undertake a [employer]

## 2011-06-01 NOTE — Assessment & Plan Note (Signed)
Will add verapamil  This will also augment rate control

## 2011-06-01 NOTE — Assessment & Plan Note (Signed)
Stable  Will reprogram AV delay to minimize V pacing

## 2011-06-09 ENCOUNTER — Ambulatory Visit (HOSPITAL_COMMUNITY): Payer: Self-pay | Attending: Internal Medicine | Admitting: Radiology

## 2011-06-09 VITALS — Ht 71.0 in | Wt 250.0 lb

## 2011-06-09 DIAGNOSIS — I447 Left bundle-branch block, unspecified: Secondary | ICD-10-CM | POA: Insufficient documentation

## 2011-06-09 DIAGNOSIS — R110 Nausea: Secondary | ICD-10-CM | POA: Insufficient documentation

## 2011-06-09 DIAGNOSIS — R61 Generalized hyperhidrosis: Secondary | ICD-10-CM | POA: Insufficient documentation

## 2011-06-09 DIAGNOSIS — R0989 Other specified symptoms and signs involving the circulatory and respiratory systems: Secondary | ICD-10-CM

## 2011-06-09 DIAGNOSIS — I10 Essential (primary) hypertension: Secondary | ICD-10-CM | POA: Insufficient documentation

## 2011-06-09 DIAGNOSIS — R0602 Shortness of breath: Secondary | ICD-10-CM | POA: Insufficient documentation

## 2011-06-09 DIAGNOSIS — R5383 Other fatigue: Secondary | ICD-10-CM | POA: Insufficient documentation

## 2011-06-09 DIAGNOSIS — R0789 Other chest pain: Secondary | ICD-10-CM | POA: Insufficient documentation

## 2011-06-09 DIAGNOSIS — R5381 Other malaise: Secondary | ICD-10-CM | POA: Insufficient documentation

## 2011-06-09 DIAGNOSIS — F17200 Nicotine dependence, unspecified, uncomplicated: Secondary | ICD-10-CM | POA: Insufficient documentation

## 2011-06-09 DIAGNOSIS — E669 Obesity, unspecified: Secondary | ICD-10-CM | POA: Insufficient documentation

## 2011-06-09 DIAGNOSIS — R0609 Other forms of dyspnea: Secondary | ICD-10-CM

## 2011-06-09 DIAGNOSIS — R42 Dizziness and giddiness: Secondary | ICD-10-CM | POA: Insufficient documentation

## 2011-06-09 MED ORDER — TECHNETIUM TC 99M TETROFOSMIN IV KIT
33.0000 | PACK | Freq: Once | INTRAVENOUS | Status: AC | PRN
  Administered 2011-06-09: 33 via INTRAVENOUS

## 2011-06-09 MED ORDER — TECHNETIUM TC 99M TETROFOSMIN IV KIT
11.0000 | PACK | Freq: Once | INTRAVENOUS | Status: AC | PRN
  Administered 2011-06-09: 11 via INTRAVENOUS

## 2011-06-09 MED ORDER — ADENOSINE (DIAGNOSTIC) 3 MG/ML IV SOLN
0.5600 mg/kg | Freq: Once | INTRAVENOUS | Status: AC
  Administered 2011-06-09: 63.6 mg via INTRAVENOUS

## 2011-06-09 NOTE — Progress Notes (Signed)
[facility] SITE 3 NUCLEAR MED [address] [place] [address] [phone]  Cardiology Nuclear Med Study  [name] Fields is a 50 y.o. male [id number] 29-Jul-1961   Nuclear Med Background Indication for Stress Test:  Evaluation for Ischemia History:  '97 Ablation; '05 Cath:Normal Coronaries, EF=45%; '06 VQQ:VZDGLO, EF=42%; 6/11 Echo:EF=60-65%; 7/11 PTVP (Generator change out) Cardiac Risk Factors: Hypertension, LBBB, Lipids, Obesity and Smoker  Symptoms:  Chest Pain/Pressure/Tightness with and without Exertion (last episode of chest discomfort was last night), Diaphoresis, Dizziness, DOE?SOB, Fatigue and Nausea    Nuclear Pre-Procedure Caffeine/Decaff Intake:  None NPO After: 6 pm   Lungs:  Clear.  O2 sat 95% on RA. IV 0.9% NS with Angio Cath:  20g  IV Site: R Hand  IV Started by:  Bonnita Levan, RN  Chest Size (in):  46 Cup Size: n/a  Height: 5\' 11"  (1.803 m)  Weight:  250 lb (113.399 kg)  BMI:  Body mass index is 34.87 kg/(m^2). Tech Comments:  A.M. Medications Taken Per Patient    Nuclear Med Study 1 or 2 day study: 1 day  Stress Test Type:  Adenosine  Reading MD: Cassell Clement, MD  Order Authorizing Provider:  Sherryl Manges, MD  Resting Radionuclide: Technetium 69m Tetrofosmin  Resting Radionuclide Dose: 11.0 mCi   Stress Radionuclide:  Technetium 9m Tetrofosmin  Stress Radionuclide Dose: 33.0 mCi           Stress Protocol Rest HR: 64 Stress HR: 80  Rest BP: 131/74 Stress BP: 161/81  Exercise Time (min): n/a METS: n/a   Predicted Max HR: 171 bpm % Max HR: 46.78 bpm Rate Pressure Product: 75643   Dose of Adenosine (mg):  60.0 Dose of Lexiscan: n/a mg  Dose of Atropine (mg): n/a Dose of Dobutamine: n/a mcg/kg/min (at max HR)  Stress Test Technologist: Smiley Houseman, CMA-N  Nuclear Technologist:  Doyne Keel, CNMT     Rest Procedure:  Myocardial perfusion imaging was performed at rest 45 minutes following the intravenous administration of  Technetium 25m Tetrofosmin.  Rest ECG: V-Paced, LBBB  Stress Procedure:  The patient received IV adenosine at 140 mcg/kg/min for 4 minutes.  There were no significant changes with infusion.  Technetium 24m Tetrofosmin was injected at the 2 minute mark and quantitative spect images were obtained after a 45 minute delay.  Stress ECG: Uninteretable due to baseline LBBB  QPS Raw Data Images:  Normal; no motion artifact; normal heart/lung ratio. Stress Images:  Normal homogeneous uptake in all areas of the myocardium. Rest Images:  Normal homogeneous uptake in all areas of the myocardium. Subtraction (SDS):  No evidence of ischemia. Transient Ischemic Dilatation (Normal <1.22):  0.99 Lung/Heart Ratio (Normal <0.45):  0.25  Quantitative Gated Spect Images QGS EDV:  187 ml QGS ESV:  101 ml QGS cine images:  Normal Wall Motion QGS EF: 46%  Impression Exercise Capacity:  Adenosine study with no exercise. BP Response:  Normal blood pressure response. Clinical Symptoms:  No chest pain. ECG Impression:  Baseline:  LBBB.  EKG uninterpretable due to LBBB at rest and stress. Comparison with Prior Nuclear Study: No significant change from previous study.  The EF has increased from 42% to 46% since last study.  Overall Impression:  Low risk stress nuclear study.  No reversible ischemia.  Physiologic apical thinning seen on stress images.  Mild LV dysfunction improved since prior study.    Cassell Clement

## 2011-06-10 NOTE — Progress Notes (Signed)
Nuclear report routed to Dr. Graciela Husbands. [name] Fields, [name] Fields

## 2011-06-13 NOTE — Progress Notes (Signed)
Please Inform Patient study is a little better with no ischemia  Thanks

## 2011-06-15 NOTE — Progress Notes (Signed)
LMTCB .  RE MYOVIEW RESULTS /CY 

## 2011-06-16 ENCOUNTER — Telehealth: Payer: Self-pay | Admitting: Internal Medicine

## 2011-06-16 NOTE — Telephone Encounter (Signed)
Pt rtn call to christine, would like a call back today

## 2011-06-16 NOTE — Telephone Encounter (Signed)
The patient is aware of his results. 

## 2011-06-16 NOTE — Progress Notes (Signed)
The patient is aware of his results. 

## 2011-09-01 ENCOUNTER — Encounter: Payer: Self-pay | Admitting: *Deleted

## 2011-09-06 ENCOUNTER — Encounter: Payer: Self-pay | Admitting: *Deleted

## 2011-09-08 ENCOUNTER — Telehealth: Payer: Self-pay | Admitting: Internal Medicine

## 2011-09-08 NOTE — Telephone Encounter (Signed)
Pt calling wanting to know if he can send remote device check from cell phone. Please return pt call to discuss further.

## 2011-09-09 NOTE — Telephone Encounter (Signed)
Patient to send transmission when he returns home this week.

## 2012-02-02 ENCOUNTER — Telehealth: Payer: Self-pay | Admitting: Internal Medicine

## 2012-02-02 ENCOUNTER — Encounter: Payer: Self-pay | Admitting: Internal Medicine

## 2012-02-02 NOTE — Telephone Encounter (Signed)
02-02-12 sent past due letter, pacer ck with clinic/mt

## 2012-02-27 ENCOUNTER — Encounter: Payer: Self-pay | Admitting: Internal Medicine

## 2012-02-27 ENCOUNTER — Ambulatory Visit (INDEPENDENT_AMBULATORY_CARE_PROVIDER_SITE_OTHER): Payer: Self-pay | Admitting: *Deleted

## 2012-02-27 DIAGNOSIS — I440 Atrioventricular block, first degree: Secondary | ICD-10-CM

## 2012-02-27 DIAGNOSIS — I441 Atrioventricular block, second degree: Secondary | ICD-10-CM

## 2012-02-27 LAB — PACEMAKER DEVICE OBSERVATION
AL AMPLITUDE: 3.6 mv
AL IMPEDENCE PM: 362.5 Ohm
AL THRESHOLD: 0.5 V
ATRIAL PACING PM: 1
BAMS-0001: 180 {beats}/min
BAMS-0003: 70 {beats}/min
BATTERY VOLTAGE: 2.9478 V
DEVICE MODEL PM: 7152165
RV LEAD AMPLITUDE: 12 mv
RV LEAD IMPEDENCE PM: 362.5 Ohm
RV LEAD THRESHOLD: 0.625 V
VENTRICULAR PACING PM: 57

## 2012-02-27 NOTE — Progress Notes (Signed)
PPM check 

## 2012-05-31 ENCOUNTER — Encounter: Payer: Self-pay | Admitting: *Deleted

## 2012-06-04 ENCOUNTER — Encounter: Payer: Self-pay | Admitting: *Deleted

## 2012-06-06 ENCOUNTER — Telehealth: Payer: Self-pay | Admitting: Internal Medicine

## 2012-06-06 NOTE — Telephone Encounter (Signed)
Pt out of town, out of med, can call to [facility] [phone] for  verapimil , pls call pt when done [phone]

## 2012-06-07 MED ORDER — VERAPAMIL HCL ER 180 MG PO TBCR: 180.0000 mg | EXTENDED_RELEASE_TABLET | Freq: Every day | ORAL | Status: DC

## 2012-06-07 NOTE — Telephone Encounter (Signed)
Per the patient's last office visit, he was started on verapamil 180 mg once daily. I called the patient to verify this. He states he can't reach his bottle right now, but the dose has not changed. I will send his RX in to the [facility] in [place] at [phone]. Sherri Rad, RN, BSN   RX called in to [facility] in [place] for Verapamil 180 mg once daily. #30 w/ 1 refill. The patient is aware I was calling this in. Sherri Rad, RN, BSN

## 2012-06-07 NOTE — Telephone Encounter (Signed)
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  Pt will be in that area until tomorrow before leaving for the next destination.  Please return call to patient to advise of medication refill any issues he may encounter. I do not see a RX in medlist for Verapimil.   Thanks Meka

## 2012-09-11 ENCOUNTER — Telehealth: Payer: Self-pay | Admitting: Cardiology

## 2012-09-11 NOTE — Telephone Encounter (Signed)
[facility] Mailed to [facility] home Address/ROI on File  09/11/12/KM

## 2012-11-08 ENCOUNTER — Encounter: Payer: Self-pay | Admitting: *Deleted

## 2012-12-06 ENCOUNTER — Other Ambulatory Visit: Payer: Self-pay | Admitting: *Deleted

## 2012-12-06 ENCOUNTER — Telehealth: Payer: Self-pay | Admitting: Internal Medicine

## 2012-12-06 DIAGNOSIS — Z950 Presence of cardiac pacemaker: Secondary | ICD-10-CM

## 2012-12-06 DIAGNOSIS — I440 Atrioventricular block, first degree: Secondary | ICD-10-CM

## 2012-12-06 DIAGNOSIS — I429 Cardiomyopathy, unspecified: Secondary | ICD-10-CM

## 2012-12-06 DIAGNOSIS — I441 Atrioventricular block, second degree: Secondary | ICD-10-CM

## 2012-12-06 MED ORDER — LISINOPRIL 10 MG PO TABS: 10.0000 mg | ORAL_TABLET | Freq: Every day | ORAL | Status: DC

## 2012-12-06 MED ORDER — VERAPAMIL HCL ER 180 MG PO TBCR: 180.0000 mg | EXTENDED_RELEASE_TABLET | Freq: Every day | ORAL | Status: DC

## 2012-12-06 NOTE — Telephone Encounter (Signed)
verapimil and lisinopril refill needed [facility] [place]

## 2012-12-07 ENCOUNTER — Encounter: Payer: Self-pay | Admitting: Internal Medicine

## 2012-12-12 ENCOUNTER — Encounter: Payer: Self-pay | Admitting: Internal Medicine

## 2013-01-02 ENCOUNTER — Ambulatory Visit (INDEPENDENT_AMBULATORY_CARE_PROVIDER_SITE_OTHER): Payer: Self-pay | Admitting: Internal Medicine

## 2013-01-02 ENCOUNTER — Encounter: Payer: Self-pay | Admitting: Internal Medicine

## 2013-01-02 VITALS — BP 160/99 | HR 72 | Ht 73.0 in | Wt 260.0 lb

## 2013-01-02 DIAGNOSIS — G4733 Obstructive sleep apnea (adult) (pediatric): Secondary | ICD-10-CM | POA: Insufficient documentation

## 2013-01-02 DIAGNOSIS — I440 Atrioventricular block, first degree: Secondary | ICD-10-CM

## 2013-01-02 DIAGNOSIS — G478 Other sleep disorders: Secondary | ICD-10-CM

## 2013-01-02 DIAGNOSIS — I441 Atrioventricular block, second degree: Secondary | ICD-10-CM

## 2013-01-02 DIAGNOSIS — G4730 Sleep apnea, unspecified: Secondary | ICD-10-CM

## 2013-01-02 DIAGNOSIS — I471 Supraventricular tachycardia: Secondary | ICD-10-CM | POA: Insufficient documentation

## 2013-01-02 DIAGNOSIS — I429 Cardiomyopathy, unspecified: Secondary | ICD-10-CM

## 2013-01-02 DIAGNOSIS — R0683 Snoring: Secondary | ICD-10-CM

## 2013-01-02 DIAGNOSIS — Z950 Presence of cardiac pacemaker: Secondary | ICD-10-CM

## 2013-01-02 DIAGNOSIS — R0989 Other specified symptoms and signs involving the circulatory and respiratory systems: Secondary | ICD-10-CM

## 2013-01-02 DIAGNOSIS — R0609 Other forms of dyspnea: Secondary | ICD-10-CM

## 2013-01-02 LAB — PACEMAKER DEVICE OBSERVATION
AL AMPLITUDE: 4.5 mv
AL IMPEDENCE PM: 362.5 Ohm
AL THRESHOLD: 0.375 V
ATRIAL PACING PM: 1
BAMS-0001: 180 {beats}/min
BAMS-0003: 70 {beats}/min
BATTERY VOLTAGE: 2.9478 V
DEVICE MODEL PM: 7152165
RV LEAD AMPLITUDE: 10.5 mv
RV LEAD IMPEDENCE PM: 362.5 Ohm
RV LEAD THRESHOLD: 0.625 V
VENTRICULAR PACING PM: 49

## 2013-01-02 MED ORDER — VERAPAMIL HCL ER 180 MG PO TBCR: 180.0000 mg | EXTENDED_RELEASE_TABLET | Freq: Every day | ORAL | Status: DC

## 2013-01-02 NOTE — Patient Instructions (Addendum)
Your physician recommends that you schedule a follow-up appointment in: June 2  [facility]SM[facility]ION  8 WEEKS WITH DR Graciela Husbands  Your physician recommends that you continue on your current medications as directed. Please refer to the Current Medication list given to you today.  Your physician has recommended that you have a sleep study. This test records several body functions during sleep, including: brain activity, eye movement, oxygen and carbon dioxide blood levels, heart rate and rhythm, breathing rate and rhythm, the flow of air through your mouth and nose, snoring, body muscle movements, and chest and belly movement. DX SNORING

## 2013-01-02 NOTE — Assessment & Plan Note (Signed)
The patient's device was interrogated and the information was fully reviewed.  The device was reprogrammed to inincrease AV delay to allow for intrinsic conduction

## 2013-01-02 NOTE — Assessment & Plan Note (Signed)
Patient has sleep disordered breathing with a headache. With his hypertension body habitus I think is important to exclude sleep apnea. We will undertake a sleep study. Also truck driver increasing importance

## 2013-01-02 NOTE — Assessment & Plan Note (Signed)
Device interrogation demonstrates atrial high rate of this with a 2:1 ratio to the bottom or so. There is also documented one-to-one AV tachycardia which has a morphology consistent with sinus. The rate was 160

## 2013-01-02 NOTE — Progress Notes (Signed)
  Patient has no care team.   HPI  [name] Fields is a 52 y.o. male Seen in followup for pacemaker implanted years ago for high-grade heart block for which she underwent generator replacement in July 2011.  He is having recurrent palpitations. He has been driving. He thinks he is going to stop as he needs to help take care of his mom.  He snores significantly. AM headache  His echocardiogram demonstrated normal left ventricular function although the last recording is 2007  Past Medical History  Diagnosis Date  . WPW (Wolff-Parkinson-White syndrome)     s/p ICD  . Hiatal hernia   . Hypertension   . Atrial tachycardia     Past Surgical History  Procedure Laterality Date  . Pacemaker insertion  1999    explanted 2011  . Cardiac defibrillator placement  2011    St Jude -- [employer] RF    Current Outpatient Prescriptions  Medication Sig Dispense Refill  . lisinopril (PRINIVIL,ZESTRIL) 10 MG tablet Take 1 tablet (10 mg total) by mouth daily.  90 tablet  0  . verapamil (CALAN-SR) 180 MG CR tablet Take 1 tablet (180 mg total) by mouth daily.  90 tablet  3   No current facility-administered medications for this visit.    No Known Allergies  Review of Systems negative except from HPI and PMH  Physical Exam BP 160/99  Pulse 72  Ht 6\' 1"  (1.854 m)  Wt 260 lb (117.935 kg)  BMI 34.31 kg/m2 Well developed and nourished in no acute distress HENT normal Neck supple with JVP-flat Clear Regular rate and rhythm, no murmurs or gallops Abd-soft with active BS No Clubbing cyanosis edema Skin-warm and dry A & Oriented  Grossly normal sensory and motor function    ECG demonstrates P. Synchronous pacing   Assessment and  Plan

## 2013-01-02 NOTE — Assessment & Plan Note (Signed)
Blood pressure is mildly elevated. He has sleep disordered breathing. We'll undertake a sleep study. We'll increase his verapamil for control of blood pressure as well as his tachypalpitations.

## 2013-01-03 ENCOUNTER — Other Ambulatory Visit: Payer: Self-pay | Admitting: *Deleted

## 2013-01-03 DIAGNOSIS — I440 Atrioventricular block, first degree: Secondary | ICD-10-CM

## 2013-01-03 DIAGNOSIS — I441 Atrioventricular block, second degree: Secondary | ICD-10-CM

## 2013-01-03 DIAGNOSIS — Z950 Presence of cardiac pacemaker: Secondary | ICD-10-CM

## 2013-01-03 DIAGNOSIS — R0683 Snoring: Secondary | ICD-10-CM

## 2013-01-03 DIAGNOSIS — I429 Cardiomyopathy, unspecified: Secondary | ICD-10-CM

## 2013-01-03 MED ORDER — VERAPAMIL HCL ER 180 MG PO TBCR: EXTENDED_RELEASE_TABLET | ORAL | Status: DC

## 2013-01-28 ENCOUNTER — Ambulatory Visit (HOSPITAL_BASED_OUTPATIENT_CLINIC_OR_DEPARTMENT_OTHER): Payer: Self-pay | Attending: Internal Medicine

## 2013-01-28 VITALS — Ht 73.0 in | Wt 260.0 lb

## 2013-01-28 DIAGNOSIS — R0683 Snoring: Secondary | ICD-10-CM

## 2013-01-28 DIAGNOSIS — G4733 Obstructive sleep apnea (adult) (pediatric): Secondary | ICD-10-CM | POA: Insufficient documentation

## 2013-01-28 DIAGNOSIS — Z9989 Dependence on other enabling machines and devices: Secondary | ICD-10-CM

## 2013-02-02 DIAGNOSIS — G4733 Obstructive sleep apnea (adult) (pediatric): Secondary | ICD-10-CM

## 2013-02-02 DIAGNOSIS — R0989 Other specified symptoms and signs involving the circulatory and respiratory systems: Secondary | ICD-10-CM

## 2013-02-02 DIAGNOSIS — R0609 Other forms of dyspnea: Secondary | ICD-10-CM

## 2013-02-02 NOTE — Procedures (Signed)
NAME:  [name] Fields, [name] Fields NO.:  [id number]  MEDICAL RECORD NO.:  [id number]          PATIENT TYPE:  OUT  LOCATION:  SLEEP CENTER                 FACILITY[facility]  [facility]  PHYSICIAN:  Clinton D. Maple Hudson, MD, FCCP, FACPDATE OF BIRTH:  April 30, 1961  DATE OF STUDY:  01/28/2013                           NOCTURNAL POLYSOMNOGRAM  REFERRING PHYSICIAN:  Duke Salvia, MD, [facility]  INDICATION FOR STUDY:  Hypersomnia with sleep apnea.  EPWORTH SLEEPINESS SCORE:  11/24.  BMI 34, weight 260 pounds, height 73 inches, neck 17.5 inches.  MEDICATIONS:  Home medications are charted and reviewed.  SLEEP ARCHITECTURE:  Split study protocol.  During the diagnostic phase, total sleep time 149 minutes with sleep efficiency 92.8%.  Stage I was 21.8%, stage II 71.1%, stage III absent.  REMS 7% of total sleep time. Sleep latency 3 minutes, REM latency 141 minute, awake after sleep onset 8.5 minutes.  Arousal index 10.5.  Bedtime medication:  None.  RESPIRATORY DATA:  Split study protocol.  Apnea/hypopnea index (AHI) 17.7 per hour.  A total of 44 events was scored including 14 obstructive apneas, 1 central apnea, 29 hypopneas.  Events were not positional.  REM AHI 28.6 per hour.  CPAP was titrated to 15 CWP, AHI 0 per hour.  He wore a large Surveyor, mining full-face mask with heated humidifier.  OXYGEN DATA:  Before CPAP.  Snoring was moderately loud with oxygen desaturation to a nadir of 77% on room air.  With CPAP titration, snoring was prevented and mean oxygen saturation held 94.9% on room air.  CARDIAC DATA:  Pacemaker, frequent PVCs.  MOVEMENT-PARASOMNIA:  A few incidental limb jerks were noted with little effect on sleep.  Bathroom x1.  IMPRESSIONS-RECOMMENDATIONS: 1. Moderate obstructive sleep apnea/hypopnea syndrome, AHI 17.7 per     hour with non-positional events.  Moderately loud snoring with     oxygen desaturation to a nadir of 77% on room air. 2. Successful CPAP titration to  15 CWP, AHI 0 per hour.  He wore a     large Surveyor, mining full-face mask with heated humidifier.     Snoring was prevented and mean oxygen saturation held 94.9% on room     air.     Clinton D. Maple Hudson, MD, [facility], FACP Diplomate, American Board of Sleep Medicine    CDY/MEDQ  D:  02/02/2013 14:26:03  T:  02/02/2013 23:36:41  Job:  [id number]

## 2013-02-20 ENCOUNTER — Telehealth: Payer: Self-pay | Admitting: Internal Medicine

## 2013-02-20 DIAGNOSIS — G4730 Sleep apnea, unspecified: Secondary | ICD-10-CM

## 2013-02-20 NOTE — Telephone Encounter (Signed)
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 Graciela Husbands on 4/25. He wants to know if he can get his sleep study results before his rescheduled OV on 5/21. Please advise.

## 2013-02-20 NOTE — Telephone Encounter (Signed)
New problem ° ° ° °Pt calling regarding test results °

## 2013-02-22 ENCOUNTER — Encounter: Payer: Self-pay | Admitting: Internal Medicine

## 2013-03-05 NOTE — Telephone Encounter (Signed)
That would be great thanks 

## 2013-03-06 ENCOUNTER — Telehealth: Payer: Self-pay | Admitting: Internal Medicine

## 2013-03-06 NOTE — Addendum Note (Signed)
Addended by: Freddi Starr on: 03/06/2013 04:00 PM   Modules accepted: Orders

## 2013-03-06 NOTE — Telephone Encounter (Signed)
Spoke with pt, aware of sleep study results. Will make a referral to pulmonary for CPAP to get started.

## 2013-03-06 NOTE — Telephone Encounter (Signed)
New problem ° ° °Pt returning your call. °

## 2013-03-06 NOTE — Telephone Encounter (Signed)
Spoke with pt, aware of sleep study results. Referral made for pt to see pulmonary for CPAP.

## 2013-03-15 ENCOUNTER — Telehealth: Payer: Self-pay | Admitting: Internal Medicine

## 2013-03-15 NOTE — Telephone Encounter (Signed)
New Problem:    Patient called in needing a letter of clearance from Dr. Graciela Husbands for his DOT physical.  Please call back.

## 2013-03-15 NOTE — Telephone Encounter (Signed)
Per dr Graciela Husbands, pt will need to get this letter from his PCP. Left message for pt of these recommendations

## 2013-03-15 NOTE — Telephone Encounter (Signed)
Spoke with pt, he is needing a letter stating his bp is controlled on his meds and it will not effect his driving. Will discuss with dr Graciela Husbands.

## 2013-03-20 ENCOUNTER — Encounter: Payer: Self-pay | Admitting: Internal Medicine

## 2013-03-20 ENCOUNTER — Ambulatory Visit (INDEPENDENT_AMBULATORY_CARE_PROVIDER_SITE_OTHER): Payer: Self-pay | Admitting: Internal Medicine

## 2013-03-20 VITALS — BP 135/78 | HR 62 | Ht 73.0 in | Wt 258.0 lb

## 2013-03-20 DIAGNOSIS — I10 Essential (primary) hypertension: Secondary | ICD-10-CM

## 2013-03-20 DIAGNOSIS — G4733 Obstructive sleep apnea (adult) (pediatric): Secondary | ICD-10-CM

## 2013-03-20 DIAGNOSIS — I429 Cardiomyopathy, unspecified: Secondary | ICD-10-CM

## 2013-03-20 DIAGNOSIS — Z950 Presence of cardiac pacemaker: Secondary | ICD-10-CM

## 2013-03-20 DIAGNOSIS — I440 Atrioventricular block, first degree: Secondary | ICD-10-CM

## 2013-03-20 DIAGNOSIS — E669 Obesity, unspecified: Secondary | ICD-10-CM | POA: Insufficient documentation

## 2013-03-20 LAB — PACEMAKER DEVICE OBSERVATION
AL AMPLITUDE: 4.2 mv
AL IMPEDENCE PM: 350 Ohm
AL THRESHOLD: 0.375 V
ATRIAL PACING PM: 1.6
BAMS-0001: 180 {beats}/min
BAMS-0003: 70 {beats}/min
BATTERY VOLTAGE: 2.95 V
DEVICE MODEL PM: 7152165
RV LEAD AMPLITUDE: 12 mv
RV LEAD IMPEDENCE PM: 360 Ohm
RV LEAD THRESHOLD: 0.75 V
VENTRICULAR PACING PM: 35

## 2013-03-20 NOTE — Progress Notes (Signed)
Patient Care Team: Duke Salvia, MD as Attending Physician (Cardiology)   HPI  [name] Fields is a 52 y.o. male Seen in followup for pacemaker implanted 1999 for high-grade heart block for which he underwent generator replacement in July 2011.  He is having recurrent palpitations. He has been driving. He thinks he is going to stop as he needs to help take care of his mom.  He snores significantly. AM headache  His echocardiogram demonstrated normal left ventricular function although the last recording is 2007; catheterization 2005 had demonstrated EF of 45% with normal coronary arteries   Past Medical History  Diagnosis Date  . WPW (Wolff-Parkinson-White syndrome)     s/p ICD  . Hiatal hernia   . Hypertension   . Atrial tachycardia   . PACEMAKER, PERMANENT 03/02/2010    Qualifier: Diagnosis of  By: Graciela Husbands, MD, [facility]   . SECOND DEGREE ATRIOVENTRICULAR HEART BLOCK 05/07/2010    Qualifier: Diagnosis of  By: Graciela Husbands, MD, [facility], [facility]     Past Surgical History  Procedure Laterality Date  . Pacemaker insertion  1999    explanted 2011  . Cardiac defibrillator placement  2011    St Jude -- [employer] RF    Current Outpatient Prescriptions  Medication Sig Dispense Refill  . lisinopril (PRINIVIL,ZESTRIL) 10 MG tablet Take 1 tablet (10 mg total) by mouth daily.  90 tablet  0  . verapamil (CALAN-SR) 180 MG CR tablet Take 2 tablets daily by mouth  180 tablet  3   No current facility-administered medications for this visit.    No Known Allergies  Review of Systems negative except from HPI and PMH  Physical Exam BP 135/78  Pulse 62  Ht 6\' 1"  (1.854 m)  Wt 258 lb (117.028 kg)  BMI 34.05 kg/m2 Well developed and nourished in no acute distress HENT normal Neck supple with JVP-flat Carotids brisk and full without bruits Clear Regular rate and rhythm, no murmurs or gallops Abd-soft with active BS without hepatomegaly No Clubbing cyanosis edema Skin-warm and dry A  & Oriented  Grossly normal sensory and motor function     Assessment and  Plan

## 2013-03-20 NOTE — Assessment & Plan Note (Signed)
His sleep study was markedly abnormal. We will refer him to pulmonary for followup

## 2013-03-20 NOTE — Assessment & Plan Note (Signed)
Currently well controlled.  

## 2013-03-20 NOTE — Assessment & Plan Note (Signed)
Has ahd stable LV function in the past; will need repeat echo will wait till next time so as to be able to hopefully lose some weight

## 2013-03-20 NOTE — Assessment & Plan Note (Signed)
We spent a good feel of time discussing obesity and strategies that he continues with his job to create an exercise routine

## 2013-03-20 NOTE — Patient Instructions (Addendum)
Remote monitoring is used to monitor your Pacemaker of ICD from home. This monitoring reduces the number of office visits required to check your device to one time per year. It allows [place] to keep an eye on the functioning of your device to ensure it is working properly. You are scheduled for a device check from home on June 24, 2013. You may send your transmission at any time that day. If you have a wireless device, the transmission will be sent automatically. After your physician reviews your transmission, you will receive a postcard with your next transmission date.  Your physician wants you to follow-up in: 1 year with Dr. Graciela Husbands.  You will receive a reminder letter in the mail two months in advance. If you don't receive a letter, please call our office to schedule the follow-up appointment.

## 2013-04-03 ENCOUNTER — Encounter: Payer: Self-pay | Admitting: Internal Medicine

## 2013-04-30 ENCOUNTER — Institutional Professional Consult (permissible substitution): Payer: Self-pay | Admitting: Internal Medicine

## 2013-05-14 ENCOUNTER — Institutional Professional Consult (permissible substitution): Payer: Self-pay | Admitting: Internal Medicine

## 2013-05-15 ENCOUNTER — Other Ambulatory Visit: Payer: Self-pay | Admitting: *Deleted

## 2013-05-15 DIAGNOSIS — I440 Atrioventricular block, first degree: Secondary | ICD-10-CM

## 2013-05-15 DIAGNOSIS — I441 Atrioventricular block, second degree: Secondary | ICD-10-CM

## 2013-05-15 DIAGNOSIS — I429 Cardiomyopathy, unspecified: Secondary | ICD-10-CM

## 2013-05-15 DIAGNOSIS — Z950 Presence of cardiac pacemaker: Secondary | ICD-10-CM

## 2013-05-15 MED ORDER — LISINOPRIL 10 MG PO TABS: 10.0000 mg | ORAL_TABLET | Freq: Every day | ORAL | Status: DC

## 2013-05-16 ENCOUNTER — Telehealth: Payer: Self-pay

## 2013-05-16 ENCOUNTER — Other Ambulatory Visit: Payer: Self-pay | Admitting: *Deleted

## 2013-05-16 ENCOUNTER — Encounter: Payer: Self-pay | Admitting: Internal Medicine

## 2013-05-16 DIAGNOSIS — Z950 Presence of cardiac pacemaker: Secondary | ICD-10-CM

## 2013-05-16 DIAGNOSIS — I440 Atrioventricular block, first degree: Secondary | ICD-10-CM

## 2013-05-16 DIAGNOSIS — I441 Atrioventricular block, second degree: Secondary | ICD-10-CM

## 2013-05-16 DIAGNOSIS — I429 Cardiomyopathy, unspecified: Secondary | ICD-10-CM

## 2013-05-16 MED ORDER — LISINOPRIL 10 MG PO TABS: 10.0000 mg | ORAL_TABLET | Freq: Every day | ORAL | Status: DC

## 2013-05-16 NOTE — Telephone Encounter (Signed)
[facility] in [place], [place] called for Lisinopril 10mg , #90, R-3. Pt visiting there. OK per Burnett Kanaris. Refill sent.

## 2013-06-24 ENCOUNTER — Encounter: Payer: Self-pay | Admitting: *Deleted

## 2013-06-25 ENCOUNTER — Ambulatory Visit (INDEPENDENT_AMBULATORY_CARE_PROVIDER_SITE_OTHER): Payer: Self-pay | Admitting: Pulmonary Disease

## 2013-06-25 ENCOUNTER — Ambulatory Visit (INDEPENDENT_AMBULATORY_CARE_PROVIDER_SITE_OTHER): Payer: Self-pay | Admitting: *Deleted

## 2013-06-25 ENCOUNTER — Encounter: Payer: Self-pay | Admitting: Internal Medicine

## 2013-06-25 ENCOUNTER — Encounter: Payer: Self-pay | Admitting: Pulmonary Disease

## 2013-06-25 VITALS — BP 132/80 | HR 70 | Temp 98.4°F | Ht 73.0 in | Wt 259.2 lb

## 2013-06-25 DIAGNOSIS — G4733 Obstructive sleep apnea (adult) (pediatric): Secondary | ICD-10-CM

## 2013-06-25 DIAGNOSIS — I441 Atrioventricular block, second degree: Secondary | ICD-10-CM

## 2013-06-25 LAB — PACEMAKER DEVICE OBSERVATION
AL AMPLITUDE: 4.7 mv
AL IMPEDENCE PM: 337.5 Ohm
AL THRESHOLD: 0.375 V
ATRIAL PACING PM: 2
BAMS-0001: 180 {beats}/min
BAMS-0003: 70 {beats}/min
BATTERY VOLTAGE: 2.9478 V
DEVICE MODEL PM: 7152165
RV LEAD AMPLITUDE: 10.5 mv
RV LEAD IMPEDENCE PM: 362.5 Ohm
RV LEAD THRESHOLD: 0.75 V
VENTRICULAR PACING PM: 35

## 2013-06-25 NOTE — Patient Instructions (Signed)
Will start on cpap at a moderate pressure level, and please call if you are having issues with tolerance.  Work on weight loss followup with me in 6 weeks.

## 2013-06-25 NOTE — Assessment & Plan Note (Signed)
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  He does tell me that he slept very well the night of the sleep study after being put on CPAP.  I think given his overall circumstances, that we should treat him aggressively with a trial of CPAP while he is working on weight loss.  The patient is agreeable to this approach.

## 2013-06-25 NOTE — Progress Notes (Signed)
Subjective:    Patient ID: [name] Fields, male    DOB: 02-19-1961, 52 y.o.   MRN: [id number]  HPI The patient is a 52 year old male who I have been asked to see for management of obstructive sleep apnea.  He had a sleep study in March of this year, where he was found to have mild to moderate OSA with an AHI of 18 events per hour.  He had oxygen desaturation as low as 77%.  He was fitted with CPAP, and found to have an optimal pressure of approximately 15 cm of water, even during REM.  The patient has an underlying cardiomyopathy and other cardiac issues, and also works as a Naval architect.  He has been told that he has loud snoring, as well as an abnormal breathing pattern during sleep at times.  He denies frequent awakenings, but is only rested 50% of the mornings at best.  The patient feels that his alertness is normal during the day, and his Epworth score is only 3.  He has some slight pressure in the evening watching television or ball games, but denies any sleepiness with driving.  His weight is up about 10-15 pounds over the last 2 years.   Sleep Questionnaire What time do you typically go to bed?( Between what hours) 11p-12am 11p-12am at 1524 on 06/25/13 by Maisie Fus, CMA How long does it take you to fall asleep?  at 1524 on 06/25/13 by Maisie Fus, CMA How many times during the night do you wake up? 2 2 at 1524 on 06/25/13 by Maisie Fus, CMA What time do you get out of bed to start your day? 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 (In pounds) 15 lb (6.804 kg)15 lb (6.804 kg) 10-15lb increase at 1524 on 06/25/13 by Maisie Fus, CMA Have you ever had a sleep study before? Yes Yes at 1524 on 06/25/13 by Maisie Fus, CMA If yes, [facility] of  [facility]24 on 06/25/13 by Maisie Fus, CMA If yes, date of study? 01/28/2013 01/28/2013 at 1524 on 06/25/13 by Maisie Fus, CMA Do you currently use CPAP? No No at 1524 on 06/25/13 by Maisie Fus, CMA If so, what pressure? Do you wear oxygen at any time? No No at 1524 on 06/25/13 by Maisie Fus, CMA   Review of Systems  Constitutional: Negative for fever and unexpected weight change.  HENT: Negative for ear pain, nosebleeds, congestion, sore throat, rhinorrhea, sneezing, trouble swallowing, dental problem, postnasal drip and sinus pressure.   Eyes: Negative for redness and itching.  Respiratory: Positive for cough. Negative for chest tightness, shortness of breath and wheezing.   Cardiovascular: Positive for leg swelling ( right leg). Negative for palpitations.  Gastrointestinal: Negative for nausea and vomiting.  Genitourinary: Negative for dysuria.  Musculoskeletal: Negative for joint swelling.  Skin: Negative for rash.  Neurological: Positive for headaches ( EOD to a few times weekly).  Hematological: Does not bruise/bleed easily.  Psychiatric/Behavioral: Negative for dysphoric mood. The patient is not nervous/anxious.        Objective:   Physical Exam Constitutional:  Obese male, no acute distress  HENT:  Nares patent without discharge, but large turbinates  Oropharynx without exudate, palate  and uvula are thick and elongated.   Eyes:  Perrla, eomi, no scleral icterus  Neck:  No JVD, no TMG  Cardiovascular:  Normal rate, regular rhythm, no rubs or gallops.  No murmurs        Intact distal pulses  Pulmonary :  Normal breath sounds, no stridor or respiratory distress   No rales, rhonchi, or wheezing  Abdominal:  Soft, nondistended, bowel sounds present.  No tenderness noted.   Musculoskeletal:  mild lower extremity edema noted.  Lymph Nodes:  No cervical lymphadenopathy noted  Skin:  No cyanosis noted  Neurologic:  Alert, appropriate,  moves all 4 extremities without obvious deficit.         Assessment & Plan:

## 2013-06-25 NOTE — Progress Notes (Signed)
PPM check in office. 

## 2013-06-26 ENCOUNTER — Encounter: Payer: Self-pay | Admitting: Internal Medicine

## 2013-06-26 ENCOUNTER — Ambulatory Visit (INDEPENDENT_AMBULATORY_CARE_PROVIDER_SITE_OTHER): Payer: Self-pay | Admitting: Internal Medicine

## 2013-06-26 VITALS — BP 150/88 | HR 69 | Ht 73.0 in | Wt 259.0 lb

## 2013-06-26 DIAGNOSIS — I441 Atrioventricular block, second degree: Secondary | ICD-10-CM

## 2013-06-26 DIAGNOSIS — R9431 Abnormal electrocardiogram [ECG] [EKG]: Secondary | ICD-10-CM

## 2013-06-26 DIAGNOSIS — Z950 Presence of cardiac pacemaker: Secondary | ICD-10-CM

## 2013-06-26 NOTE — Assessment & Plan Note (Signed)
We will undertake jholter and echo to look for evidence of worsening LV function which might be attributable to excessive Vpacing and holter to try and sort out what percentage of 35 % Vpacing is fusion or pseudofusion and as such not potentially contributing to potentially deleterious outcome

## 2013-06-26 NOTE — Patient Instructions (Addendum)
Your physician has recommended that you wear a holter monitor. Holter monitors are medical devices that record the heart's electrical activity. Doctors most often use these monitors to diagnose arrhythmias. Arrhythmias are problems with the speed or rhythm of the heartbeat. The monitor is a small, portable device. You can wear one while you do your normal daily activities. This is usually used to diagnose what is causing palpitations/syncope (passing out).  Your physician recommends that you continue on your current medications as directed. Please refer to the Current Medication list given to you today.  

## 2013-06-26 NOTE — Progress Notes (Signed)
Patient Care Team: Duke Salvia, MD as Attending Physician (Cardiology)   HPI  [name] Fields is a 52 y.o. male Seen in followup for pacemaker implanted 1999 for high-grade heart block for which he underwent generator replacement in July 2011.  He is having recurrent palpitations.  Device interrogation demonstrated 35% V pacing even with AV delay of 350 msec  He is walking and has no specific complaint  Past Medical History  Diagnosis Date  . WPW (Wolff-Parkinson-White syndrome)     s/p ICD  . Hiatal hernia   . Hypertension   . Atrial tachycardia   . Permanent pacemaker-St. Jude      DOI 1999; gen re in the summer he has o pain syndrome BP 135% at time long so is his we have been short of his device is in a in a in a Foley disease since the stents removed about 3.0-4 seconds on the episode the patient is very 34 and a [place] when his pacemaker but when he and his placement 2011  . High-grade second-degree heart block      Qualifier: Diagnosis of  By: Graciela Husbands, MD, [facility] Cassette   . Cardiomyopathy, nonischemic     Past Surgical History  Procedure Laterality Date  . Pacemaker insertion  1999    explanted 2011  . Cardiac defibrillator placement  2011    St Jude -- [employer] RF    Current Outpatient Prescriptions  Medication Sig Dispense Refill  . lisinopril (PRINIVIL,ZESTRIL) 10 MG tablet Take 1 tablet (10 mg total) by mouth daily.  90 tablet  3  . Multiple Vitamins-Minerals (MULTIVITAMIN WITH MINERALS) tablet Take 1 tablet by mouth daily.      . verapamil (CALAN-SR) 180 MG CR tablet Take 2 tablets daily by mouth  180 tablet  3   No current facility-administered medications for this visit.    No Known Allergies  Review of Systems negative except from HPI and PMH  Physical Exam BP 150/88  Pulse 69  Ht 6\' 1"  (1.854 m)  Wt 259 lb (117.482 kg)  BMI 34.18 kg/m2 Well developed and nourished in no acute distress HENT normal Neck suppleClear Regular rate and rhythm,  no murmurs or gallops Abd-soft with active BS  No Clubbing cyanosis edema Skin-warm and dry A & Oriented  Grossly normal sensory and motor function     Assessment and  Plan

## 2013-06-27 ENCOUNTER — Telehealth: Payer: Self-pay | Admitting: Internal Medicine

## 2013-06-27 ENCOUNTER — Telehealth: Payer: Self-pay | Admitting: Pulmonary Disease

## 2013-06-27 NOTE — Telephone Encounter (Signed)
New Problem  Pt is at [facility] for DOT Physical// they will not complete the physical because they need more information// Pt states he will have their office fax over the list of additional information needed.

## 2013-06-27 NOTE — Telephone Encounter (Signed)
Will await fax.

## 2013-06-27 NOTE — Telephone Encounter (Signed)
Follow up   Pt want to know if you receiced fax from  [facility]. Please call pt

## 2013-06-27 NOTE — Telephone Encounter (Signed)
Spoke with pt, aware fax is in medical records for faxing.

## 2013-06-27 NOTE — Telephone Encounter (Signed)
[email or web address]  

## 2013-06-28 NOTE — Telephone Encounter (Signed)
I spoke with the pt and he states that he was told that it would take 3 weeks to get his cpap machine and then he needs to use it for 1 week with a download before he can go back to work. He states he cannot be out of work for 1 month. I spoke with Bjorn Loser and she states that Melissa with [facility] advised that through [employer] they usually receive cpap in 3-5 days. So I advised the pt to give it a week and if he has not heard anything about his cpap to call us back. Carron Curie, CMA

## 2013-07-03 ENCOUNTER — Other Ambulatory Visit: Payer: Self-pay | Admitting: *Deleted

## 2013-07-03 DIAGNOSIS — I428 Other cardiomyopathies: Secondary | ICD-10-CM

## 2013-07-04 ENCOUNTER — Telehealth: Payer: Self-pay | Admitting: Pulmonary Disease

## 2013-07-04 NOTE — Telephone Encounter (Signed)
I spoke with pt. He stated he is requesting a letter from Dr. Shelle Iron stating restrictions for him working. 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 Pt reports he needs to work and can not afford to be off work. He takes care of plenty of people. He will get his machine in about 3 weeks. Per pt his machine had to be ordered and he went through [place] sleep [employer] for assistance with the CPAP. Per Bjorn Loser she needs to call [facility] and speak with crystal to see when pt will get machine. Will send to rhonda to document her part.

## 2013-07-05 NOTE — Telephone Encounter (Signed)
Melissa spoke with her Respiratory Team and they stated that depending upon what the [employer] has in stock it usually takes anywhere from 1-2 weeks. I have left a message on the [employer] CPAP Assistance phone for them to return my call.  Pt has been advised that he could purchase a refurbished machine through a dme company and have it today, however, he wants to go this route. I will forward this message to Dr. Shelle Iron and let him address the below message from the patient requesting a letter for work. Rhonda J Cobb

## 2013-07-05 NOTE — Telephone Encounter (Signed)
Called and spoke with Melissa at [facility] and she will check with her respiratory team and see if she can find out after patient completes his part, about how long does it take for the CPAP to be mailed to the home care. Rhonda J Cobb

## 2013-07-05 NOTE — Telephone Encounter (Signed)
Spoke with Mrs. [name] Fields at the [employer] Interior and spatial designer of the CPAP Assistance program. She advised that the device has was shipped yesterday afternoon by [employer] ground. Should arrive at [facility] in 3-4 days. [facility] will contact patient to arrange set up once device arrives. Rhonda J Cobb

## 2013-07-18 ENCOUNTER — Encounter (INDEPENDENT_AMBULATORY_CARE_PROVIDER_SITE_OTHER): Payer: Self-pay

## 2013-07-18 ENCOUNTER — Encounter: Payer: Self-pay | Admitting: *Deleted

## 2013-07-18 ENCOUNTER — Ambulatory Visit (HOSPITAL_COMMUNITY): Payer: Self-pay | Attending: Internal Medicine | Admitting: Radiology

## 2013-07-18 DIAGNOSIS — R9431 Abnormal electrocardiogram [ECG] [EKG]: Secondary | ICD-10-CM

## 2013-07-18 DIAGNOSIS — E669 Obesity, unspecified: Secondary | ICD-10-CM | POA: Insufficient documentation

## 2013-07-18 DIAGNOSIS — Z45018 Encounter for adjustment and management of other part of cardiac pacemaker: Secondary | ICD-10-CM

## 2013-07-18 DIAGNOSIS — I428 Other cardiomyopathies: Secondary | ICD-10-CM

## 2013-07-18 DIAGNOSIS — I441 Atrioventricular block, second degree: Secondary | ICD-10-CM | POA: Insufficient documentation

## 2013-07-18 DIAGNOSIS — I440 Atrioventricular block, first degree: Secondary | ICD-10-CM

## 2013-07-18 DIAGNOSIS — Z950 Presence of cardiac pacemaker: Secondary | ICD-10-CM

## 2013-07-18 NOTE — Progress Notes (Signed)
Echocardiogram performed.  

## 2013-07-18 NOTE — Progress Notes (Signed)
Patient ID: [name] Fields, male   DOB: 1960-11-09, 52 y.o.   MRN: [id number] E-Cardio 24 Hour Holter Monitor applied to patient.

## 2013-07-19 ENCOUNTER — Encounter: Payer: Self-pay | Admitting: *Deleted

## 2013-07-19 ENCOUNTER — Telehealth: Payer: Self-pay | Admitting: Internal Medicine

## 2013-07-19 NOTE — Telephone Encounter (Signed)
Follow up  Pt calling about fax that was never received by [facility]... States he is there now... Request that this is faxed over as soon as possible.  Fax # [phone]

## 2013-07-19 NOTE — Telephone Encounter (Signed)
Pt had echo on 07-18-13 that documented pt had ICD. Missy was made aware of this and is having the echo report addended. Per Assunta Curtis S needs documentation by Monday due to issuing liscense. Addendum to be faxed and # was given to Missy.

## 2013-07-19 NOTE — Telephone Encounter (Signed)
Has a question about his device.  He wants to know what kind it is.  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

## 2013-07-19 NOTE — Telephone Encounter (Signed)
Pt is faxing over a list of everything that is needed, he states he is trying to be released to return to work.

## 2013-07-19 NOTE — Telephone Encounter (Signed)
Pt went for DOT physical and was told he has an ICD/ pt was told by Dr Graciela Husbands he has a pacemaker.  If he has a pacer then the chart history needs to be changed and refaxed to [facility] [phone],   if he has an ICD he will need a letter from Dr Graciela Husbands stating he is able to drive.  Pt was told that Pacer clinic staff will call him back today.

## 2013-07-19 NOTE — Telephone Encounter (Signed)
**Note De-Identified [name] Fields Obfuscation** All information faxed to [facility], pt is aware.

## 2013-07-22 ENCOUNTER — Encounter: Payer: Self-pay | Admitting: Internal Medicine

## 2013-07-22 NOTE — Telephone Encounter (Signed)
Done  Pt has pacemaker the ICD comment was an error

## 2013-07-30 ENCOUNTER — Other Ambulatory Visit (HOSPITAL_COMMUNITY): Payer: Self-pay

## 2013-08-06 ENCOUNTER — Ambulatory Visit: Payer: Self-pay | Admitting: Pulmonary Disease

## 2013-08-08 ENCOUNTER — Other Ambulatory Visit (HOSPITAL_COMMUNITY): Payer: Self-pay | Admitting: Cardiology

## 2013-08-08 ENCOUNTER — Ambulatory Visit (HOSPITAL_COMMUNITY): Payer: Self-pay | Attending: Cardiovascular Disease

## 2013-08-08 ENCOUNTER — Telehealth: Payer: Self-pay | Admitting: *Deleted

## 2013-08-08 DIAGNOSIS — R0989 Other specified symptoms and signs involving the circulatory and respiratory systems: Secondary | ICD-10-CM

## 2013-08-08 DIAGNOSIS — I429 Cardiomyopathy, unspecified: Secondary | ICD-10-CM | POA: Insufficient documentation

## 2013-08-08 MED ORDER — PERFLUTREN PROTEIN A MICROSPH IV SUSP
2.0000 mL | Freq: Once | INTRAVENOUS | Status: AC
  Administered 2013-08-08: 2 mL via INTRAVENOUS

## 2013-08-08 NOTE — Progress Notes (Signed)
Limited Echocardiogram with [employer] performed.

## 2013-08-08 NOTE — Telephone Encounter (Signed)
LMTCB about increasing Lisinopril to 20mg  per Dr. Graciela Husbands

## 2013-08-09 ENCOUNTER — Telehealth: Payer: Self-pay | Admitting: *Deleted

## 2013-08-09 ENCOUNTER — Other Ambulatory Visit: Payer: Self-pay | Admitting: *Deleted

## 2013-08-09 DIAGNOSIS — I429 Cardiomyopathy, unspecified: Secondary | ICD-10-CM

## 2013-08-09 DIAGNOSIS — Z950 Presence of cardiac pacemaker: Secondary | ICD-10-CM

## 2013-08-09 DIAGNOSIS — I440 Atrioventricular block, first degree: Secondary | ICD-10-CM

## 2013-08-09 DIAGNOSIS — I441 Atrioventricular block, second degree: Secondary | ICD-10-CM

## 2013-08-09 MED ORDER — LISINOPRIL 20 MG PO TABS: 20.0000 mg | ORAL_TABLET | Freq: Every day | ORAL | Status: DC

## 2013-08-09 NOTE — Telephone Encounter (Signed)
Called patient to verify we had discussed his Lisinopril increase. Patient stated he knew and understood.  He then proceeded to explain that he was about to call to report numbness in his right arm/hand/fingers that last from 20 minutes to an hours. He states that this has occurred for the past 3 weeks on and off. He is aware Dr. Graciela Husbands is not in office today and that I will address with him beginning of next week. I advised him to go to hospital if needed before next week. Patient verbalized understanding and agreeable to plan.

## 2013-08-09 NOTE — Telephone Encounter (Signed)
Patient aware of Holter results - pacemaker is firing when it needs to be, per Dr. Graciela Husbands. Patient verbalized understanding.

## 2013-08-12 ENCOUNTER — Ambulatory Visit: Payer: Self-pay | Admitting: Pulmonary Disease

## 2013-08-12 NOTE — Telephone Encounter (Signed)
Advised patient to follow up with a PCP about his right arm numbness per Dr. Graciela Husbands. Patient agreeable to plan.

## 2013-09-17 ENCOUNTER — Ambulatory Visit: Payer: Self-pay | Admitting: Pulmonary Disease

## 2013-09-30 ENCOUNTER — Ambulatory Visit: Payer: Self-pay | Admitting: Pulmonary Disease

## 2013-09-30 ENCOUNTER — Encounter: Payer: Self-pay | Admitting: *Deleted

## 2013-10-08 ENCOUNTER — Encounter: Payer: Self-pay | Admitting: *Deleted

## 2013-10-22 ENCOUNTER — Ambulatory Visit: Payer: Self-pay | Admitting: Pulmonary Disease

## 2013-12-25 ENCOUNTER — Other Ambulatory Visit: Payer: Self-pay

## 2013-12-25 DIAGNOSIS — Z950 Presence of cardiac pacemaker: Secondary | ICD-10-CM

## 2013-12-25 DIAGNOSIS — I441 Atrioventricular block, second degree: Secondary | ICD-10-CM

## 2013-12-25 DIAGNOSIS — I440 Atrioventricular block, first degree: Secondary | ICD-10-CM

## 2013-12-25 DIAGNOSIS — I429 Cardiomyopathy, unspecified: Secondary | ICD-10-CM

## 2013-12-25 MED ORDER — VERAPAMIL HCL ER 180 MG PO TBCR: EXTENDED_RELEASE_TABLET | ORAL | Status: DC

## 2014-01-28 ENCOUNTER — Encounter: Payer: Self-pay | Admitting: Internal Medicine

## 2014-01-28 ENCOUNTER — Telehealth: Payer: Self-pay | Admitting: Internal Medicine

## 2014-01-28 ENCOUNTER — Ambulatory Visit (INDEPENDENT_AMBULATORY_CARE_PROVIDER_SITE_OTHER): Payer: Self-pay | Admitting: Internal Medicine

## 2014-01-28 VITALS — BP 154/97 | HR 74 | Ht 73.0 in | Wt 257.8 lb

## 2014-01-28 DIAGNOSIS — I441 Atrioventricular block, second degree: Secondary | ICD-10-CM

## 2014-01-28 DIAGNOSIS — I471 Supraventricular tachycardia: Secondary | ICD-10-CM

## 2014-01-28 DIAGNOSIS — I498 Other specified cardiac arrhythmias: Secondary | ICD-10-CM

## 2014-01-28 DIAGNOSIS — Z950 Presence of cardiac pacemaker: Secondary | ICD-10-CM

## 2014-01-28 DIAGNOSIS — R0609 Other forms of dyspnea: Secondary | ICD-10-CM

## 2014-01-28 DIAGNOSIS — R0989 Other specified symptoms and signs involving the circulatory and respiratory systems: Secondary | ICD-10-CM

## 2014-01-28 DIAGNOSIS — R0600 Dyspnea, unspecified: Secondary | ICD-10-CM

## 2014-01-28 LAB — MDC_IDC_ENUM_SESS_TYPE_INCLINIC
Battery Remaining Longevity: 98.4 mo
Battery Voltage: 2.95 V
Brady Statistic RA Percent Paced: 2.8 %
Brady Statistic RV Percent Paced: 42 %
Date Time Interrogation Session: 20150331202428
Implantable Pulse Generator Model: 2210
Implantable Pulse Generator Serial Number: 7152165
Lead Channel Impedance Value: 362.5 Ohm
Lead Channel Impedance Value: 362.5 Ohm
Lead Channel Pacing Threshold Amplitude: 0.375 V
Lead Channel Pacing Threshold Amplitude: 0.75 V
Lead Channel Pacing Threshold Pulse Width: 0.5 ms
Lead Channel Pacing Threshold Pulse Width: 0.7 ms
Lead Channel Sensing Intrinsic Amplitude: 4.2 mV
Lead Channel Sensing Intrinsic Amplitude: 9.7 mV
Lead Channel Setting Pacing Amplitude: 1.125
Lead Channel Setting Pacing Amplitude: 1.375
Lead Channel Setting Pacing Pulse Width: 0.5 ms
Lead Channel Setting Sensing Sensitivity: 2 mV

## 2014-01-28 NOTE — Telephone Encounter (Signed)
Pt scheduled to come in at 4pm today. He is agreeable to plan, and thankful for working with him and his schedule.

## 2014-01-28 NOTE — Telephone Encounter (Signed)
New Message:  PT is wanting to know if he can be worked in for an appt this afternoon. Pt would like a call back from [place].

## 2014-01-28 NOTE — Progress Notes (Signed)
      Patient Care Team: [name], MD as Attending Physician (Cardiology)   HPI  [name] Fields is a 53 y.o. male Seen in followup for pacemaker implanted 1999 for high-grade heart block for which he underwent generator replacement in July 2011.   He is having recurrent palpitations.   Device interrogation demonstrated 35% V pacing even with AV delay of 350 msec   He is have DOE; echo 10/14>.Normal LV function  He has not had any edema    Past Medical History  Diagnosis Date  . WPW (Wolff-Parkinson-White syndrome)     s/pRFCA  . Hiatal hernia   . Hypertension   . Atrial tachycardia   . Permanent pacemaker-St. Jude      DOI 1999; gen re in the summer he has o pain syndrome BP 135% at time long so is his we have been short of his device is in a in a in a Foley disease since the stents removed about 3.0-4 seconds on the episode the patient is very 51 and a [place] when his pacemaker but when he and his placement 2011  . High-grade second-degree heart block      Qualifier: Diagnosis of  By: Caryl Comes, MD, Remus Blake   . Cardiomyopathy, nonischemic     Past Surgical History  Procedure Laterality Date  . Pacemaker insertion  1999    explanted 2011  . Cardiac defibrillator placement  2011    St Jude -- [employer]ent RF    Current Outpatient Prescriptions  Medication Sig Dispense Refill  . lisinopril (PRINIVIL,ZESTRIL) 20 MG tablet Take 1 tablet (20 mg total) by mouth daily.  90 tablet  3  . Multiple Vitamins-Minerals (MULTIVITAMIN WITH MINERALS) tablet Take 1 tablet by mouth daily.      . verapamil (CALAN-SR) 180 MG CR tablet Take 2 tablets daily by mouth  180 tablet  0   No current facility-administered medications for this visit.    No Known Allergies  Review of Systems negative except from HPI and PMH  Physical Exam BP 154/97  Pulse 74  Ht 6\' 1"  (1.854 m)  Wt 257 lb 12.8 oz (116.937 kg)  BMI 34.02 kg/m2 Well developed and well nourished in no acute  distress HENT normal E scleral and icterus clear Neck Supple JVP flat; carotids brisk and full Clear to ausculation {C Regular rate and rhythm, no murmurs gallops or rub Soft with active bowel sounds No clubbing cyanosis  Edema Alert and oriented, grossly normal motor and sensory function Skin Warm and Dry    Assessment and  Plan  2AVBlock  Pacemaker St Jude  Cardiomyopathy-resolved  Hypertension  Dyspnea on exertion  His dyspnea on exertion may be related to upper rate behavior with his pacemaker. He has intrinsic conduction is quite long. He is a [place] time of 400 ms. Holter monitor from last summer suggested the maintenance and PVC induced pacemaker mediated tachycardia   For now we will try programming DDI and see what happens at upper rate. I have warned him that he may feel worse. We will then bring him in for treadmill testing. It may be, now than in the context of normal if LV function, we could program is a delay short allowing [place] to maintain intrinsic conduction  and a long enough PVARP  to prevent   PMT

## 2014-01-28 NOTE — Patient Instructions (Signed)
Your physician recommends that you continue on your current medications as directed. Please refer to the Current Medication list given to you today.  Your physician has requested that you have an exercise tolerance test. For further information please visit [email or web address]. Please also follow instruction sheet, as given.  Follow will be determined based upon GXT results

## 2014-02-05 ENCOUNTER — Telehealth (HOSPITAL_COMMUNITY): Payer: Self-pay

## 2014-02-07 ENCOUNTER — Ambulatory Visit (HOSPITAL_COMMUNITY)
Admission: RE | Admit: 2014-02-07 | Discharge: 2014-02-07 | Disposition: A | Discharge status: Home / Self Care | Payer: Self-pay | Source: Ambulatory Visit | Attending: Internal Medicine | Admitting: Internal Medicine

## 2014-02-07 DIAGNOSIS — R0989 Other specified symptoms and signs involving the circulatory and respiratory systems: Secondary | ICD-10-CM | POA: Insufficient documentation

## 2014-02-07 DIAGNOSIS — R0609 Other forms of dyspnea: Secondary | ICD-10-CM | POA: Insufficient documentation

## 2014-02-07 DIAGNOSIS — R0600 Dyspnea, unspecified: Secondary | ICD-10-CM

## 2014-02-28 ENCOUNTER — Encounter: Payer: Self-pay | Admitting: Internal Medicine

## 2014-04-17 ENCOUNTER — Other Ambulatory Visit: Payer: Self-pay | Admitting: *Deleted

## 2014-04-17 ENCOUNTER — Other Ambulatory Visit: Payer: Self-pay

## 2014-04-17 DIAGNOSIS — I441 Atrioventricular block, second degree: Secondary | ICD-10-CM

## 2014-04-17 DIAGNOSIS — Z950 Presence of cardiac pacemaker: Secondary | ICD-10-CM

## 2014-04-17 DIAGNOSIS — I440 Atrioventricular block, first degree: Secondary | ICD-10-CM

## 2014-04-17 DIAGNOSIS — I429 Cardiomyopathy, unspecified: Secondary | ICD-10-CM

## 2014-04-17 MED ORDER — VERAPAMIL HCL ER 180 MG PO TBCR: EXTENDED_RELEASE_TABLET | ORAL | Status: DC

## 2014-04-17 MED ORDER — LISINOPRIL 20 MG PO TABS: 20.0000 mg | ORAL_TABLET | Freq: Every day | ORAL | Status: DC

## 2014-04-30 ENCOUNTER — Telehealth: Payer: Self-pay | Admitting: Cardiology

## 2014-04-30 ENCOUNTER — Encounter: Payer: Self-pay | Admitting: *Deleted

## 2014-04-30 NOTE — Telephone Encounter (Signed)
Called pt to confirm remote transmission to be done today. Pt stated that he needs to see MD b/c when he was seen on 3-31 there was a changed made to his pacemaker and that he still feels bad. Informed pt that I would get device tech charles to call pt back to discuss this with him further. Pt out of town until Friday 05-02-14.

## 2014-04-30 NOTE — Telephone Encounter (Signed)
Encounter complete. 

## 2014-05-01 ENCOUNTER — Encounter: Payer: Self-pay | Admitting: Cardiology

## 2014-08-05 ENCOUNTER — Encounter: Payer: Self-pay | Admitting: *Deleted

## 2014-08-12 ENCOUNTER — Encounter: Payer: Self-pay | Admitting: Cardiology

## 2014-08-27 ENCOUNTER — Other Ambulatory Visit: Payer: Self-pay

## 2014-08-27 ENCOUNTER — Telehealth: Payer: Self-pay | Admitting: *Deleted

## 2014-08-27 DIAGNOSIS — I429 Cardiomyopathy, unspecified: Secondary | ICD-10-CM

## 2014-08-27 DIAGNOSIS — Z950 Presence of cardiac pacemaker: Secondary | ICD-10-CM

## 2014-08-27 DIAGNOSIS — I440 Atrioventricular block, first degree: Secondary | ICD-10-CM

## 2014-08-27 MED ORDER — LISINOPRIL 20 MG PO TABS: 20.0000 mg | ORAL_TABLET | Freq: Every day | ORAL | Status: DC

## 2014-08-27 MED ORDER — VERAPAMIL HCL ER 180 MG PO TBCR: 180.0000 mg | EXTENDED_RELEASE_TABLET | Freq: Every day | ORAL | Status: DC

## 2014-08-27 NOTE — Telephone Encounter (Signed)
Called to f/u w/ pt post treadmill test in April. Pt needs to see Dr. Caryl Comes to discuss treadmill results along w/ potential programming changes. 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 Appts can be challenging to arrange.

## 2014-08-27 NOTE — Telephone Encounter (Signed)
Pt states since programming change he has felt horrible during any form of exertion. I let pt know he can make an appt easily w/ device clinic faster than seeing Dr. Caryl Comes. Pt knows he also needs to see Dr. Caryl Comes. Pt will try to see device clinic week of 09/01/14.   I also clarified to pt he cannot use his Merlin through his laptop. Pt now understands Merlin must be used w/ analog phone line or we can arrange a cell adapter.

## 2014-09-11 ENCOUNTER — Ambulatory Visit (INDEPENDENT_AMBULATORY_CARE_PROVIDER_SITE_OTHER): Payer: Self-pay | Admitting: *Deleted

## 2014-09-11 DIAGNOSIS — Z950 Presence of cardiac pacemaker: Secondary | ICD-10-CM

## 2014-09-11 LAB — MDC_IDC_ENUM_SESS_TYPE_INCLINIC
Battery Remaining Longevity: 116.4 mo
Battery Voltage: 2.95 V
Brady Statistic RA Percent Paced: 17 %
Brady Statistic RV Percent Paced: 19 %
Date Time Interrogation Session: 20151112225446
Implantable Pulse Generator Model: 2210
Implantable Pulse Generator Serial Number: 7152165
Lead Channel Impedance Value: 350 Ohm
Lead Channel Impedance Value: 362.5 Ohm
Lead Channel Pacing Threshold Amplitude: 0.375 V
Lead Channel Pacing Threshold Amplitude: 0.75 V
Lead Channel Pacing Threshold Pulse Width: 0.5 ms
Lead Channel Pacing Threshold Pulse Width: 0.7 ms
Lead Channel Sensing Intrinsic Amplitude: 3.1 mV
Lead Channel Sensing Intrinsic Amplitude: 9.5 mV
Lead Channel Setting Pacing Amplitude: 0.875
Lead Channel Setting Pacing Amplitude: 2 V
Lead Channel Setting Pacing Pulse Width: 0.5 ms
Lead Channel Setting Sensing Sensitivity: 2 mV

## 2014-09-12 ENCOUNTER — Ambulatory Visit (INDEPENDENT_AMBULATORY_CARE_PROVIDER_SITE_OTHER): Payer: Self-pay | Admitting: *Deleted

## 2014-09-12 ENCOUNTER — Other Ambulatory Visit: Payer: Self-pay

## 2014-09-12 DIAGNOSIS — Z950 Presence of cardiac pacemaker: Secondary | ICD-10-CM

## 2014-09-12 LAB — MDC_IDC_ENUM_SESS_TYPE_INCLINIC
Battery Remaining Longevity: 122.4 mo
Battery Voltage: 2.95 V
Brady Statistic RA Percent Paced: 0.04 %
Brady Statistic RV Percent Paced: 33 %
Date Time Interrogation Session: 20151113094814
Implantable Pulse Generator Model: 2210
Implantable Pulse Generator Serial Number: 7152165
Lead Channel Pacing Threshold Amplitude: 0.375 V
Lead Channel Pacing Threshold Amplitude: 0.875 V
Lead Channel Pacing Threshold Pulse Width: 0.5 ms
Lead Channel Pacing Threshold Pulse Width: 0.7 ms
Lead Channel Sensing Intrinsic Amplitude: 3.1 mV
Lead Channel Sensing Intrinsic Amplitude: 9.5 mV
Lead Channel Setting Pacing Amplitude: 1 V
Lead Channel Setting Pacing Amplitude: 2 V
Lead Channel Setting Pacing Pulse Width: 0.5 ms
Lead Channel Setting Sensing Sensitivity: 2 mV

## 2014-09-12 MED ORDER — VERAPAMIL HCL ER 180 MG PO TBCR: 180.0000 mg | EXTENDED_RELEASE_TABLET | Freq: Every day | ORAL | Status: DC

## 2014-09-12 NOTE — Progress Notes (Signed)
Pt return from yesterday's visit. Had pt walk stairs today. Exertion shows intrinsic conduction with appropriate AV delay rate responses. Once pt nears resting rate, his intrinsic AV lengthens. Changed VIP extension from 125 to 124ms. Pt will keep track of symptoms during exertion & resting. Pt understand he will likely pace more while recumbent. Pt still c/o of DOE. However, today's testing shows DOE not related to arrhythmia/pacer. ROV w/ Dr. Caryl Comes 11/05/14.

## 2014-09-12 NOTE — Progress Notes (Signed)
Pt c/o of programming changes since March. Pt c/o of DOE. Changed mode from DDI back to DDD. Changed sensed/paced from 325/316ms to 250/238ms due to pt's intrinsic AV delay of 238-212ms. Turned on VIP to 125ms. Set MTR to 130bpm. Pt will return tomorrow to discuss changes/symptoms.

## 2014-09-12 NOTE — Progress Notes (Deleted)
Pt c/o of programming changes since March. Pt c/o of DOE. Changed mode from DDI back to DDD. Changed sensed/paced from 325/367ms to 250/282ms due to pt's intrinsic AV delay of 238-255ms. Turned on VIP to 137ms. Set MTR to 130bpm. Pt will return tomorrow to discuss changes/symptoms.

## 2014-09-24 ENCOUNTER — Encounter: Payer: Self-pay | Admitting: Internal Medicine

## 2014-11-05 ENCOUNTER — Encounter: Payer: Self-pay | Admitting: Internal Medicine

## 2014-11-06 ENCOUNTER — Encounter: Payer: Self-pay | Admitting: Internal Medicine

## 2015-01-12 ENCOUNTER — Other Ambulatory Visit: Payer: Self-pay

## 2015-01-12 ENCOUNTER — Telehealth: Payer: Self-pay | Admitting: Internal Medicine

## 2015-01-12 DIAGNOSIS — I429 Cardiomyopathy, unspecified: Secondary | ICD-10-CM

## 2015-01-12 MED ORDER — LISINOPRIL 20 MG PO TABS: 20.0000 mg | ORAL_TABLET | Freq: Every day | ORAL | Status: DC

## 2015-01-12 MED ORDER — VERAPAMIL HCL ER 180 MG PO TBCR: 180.0000 mg | EXTENDED_RELEASE_TABLET | Freq: Every day | ORAL | Status: DC

## 2015-01-12 NOTE — Telephone Encounter (Signed)
Error

## 2015-02-04 ENCOUNTER — Ambulatory Visit: Payer: Self-pay | Admitting: Physician Assistant

## 2015-03-02 ENCOUNTER — Encounter: Payer: Self-pay | Admitting: Internal Medicine

## 2015-05-29 ENCOUNTER — Ambulatory Visit (INDEPENDENT_AMBULATORY_CARE_PROVIDER_SITE_OTHER): Payer: Self-pay | Admitting: Internal Medicine

## 2015-05-29 ENCOUNTER — Encounter: Payer: Self-pay | Admitting: Internal Medicine

## 2015-05-29 VITALS — BP 160/100 | HR 77 | Ht 73.0 in | Wt 257.6 lb

## 2015-05-29 DIAGNOSIS — R9431 Abnormal electrocardiogram [ECG] [EKG]: Secondary | ICD-10-CM

## 2015-05-29 DIAGNOSIS — Z950 Presence of cardiac pacemaker: Secondary | ICD-10-CM

## 2015-05-29 DIAGNOSIS — I429 Cardiomyopathy, unspecified: Secondary | ICD-10-CM

## 2015-05-29 DIAGNOSIS — I471 Supraventricular tachycardia: Secondary | ICD-10-CM

## 2015-05-29 DIAGNOSIS — I440 Atrioventricular block, first degree: Secondary | ICD-10-CM

## 2015-05-29 LAB — BASIC METABOLIC PANEL
BUN: 10 mg/dL (ref 6–23)
CO2: 26 mEq/L (ref 19–32)
Calcium: 9.6 mg/dL (ref 8.4–10.5)
Chloride: 106 mEq/L (ref 96–112)
Creatinine, Ser: 1.04 mg/dL (ref 0.40–1.50)
GFR: 95.81 mL/min (ref >60.00)
Glucose, Bld: 94 mg/dL (ref 70–99)
Potassium: 3.9 mEq/L (ref 3.5–5.1)
Sodium: 139 mEq/L (ref 135–145)

## 2015-05-29 LAB — CUP PACEART INCLINIC DEVICE CHECK
Battery Remaining Longevity: 111.6 mo
Battery Voltage: 2.95 V
Brady Statistic RA Percent Paced: 1.7 %
Brady Statistic RV Percent Paced: 28 %
Date Time Interrogation Session: 20160729085820
Lead Channel Impedance Value: 325 Ohm
Lead Channel Impedance Value: 337.5 Ohm
Lead Channel Pacing Threshold Amplitude: 0.5 V
Lead Channel Pacing Threshold Amplitude: 0.75 V
Lead Channel Pacing Threshold Pulse Width: 0.5 ms
Lead Channel Pacing Threshold Pulse Width: 0.7 ms
Lead Channel Sensing Intrinsic Amplitude: 12 mV
Lead Channel Sensing Intrinsic Amplitude: 3.7 mV
Lead Channel Setting Pacing Amplitude: 2 V
Lead Channel Setting Pacing Amplitude: 2.5 V
Lead Channel Setting Pacing Pulse Width: 0.5 ms
Lead Channel Setting Sensing Sensitivity: 2 mV
Pulse Gen Model: 2210
Pulse Gen Serial Number: 7152165

## 2015-05-29 LAB — CBC WITH DIFFERENTIAL/PLATELET
Basophils Absolute: 0 10*3/uL (ref 0.0–0.1)
Basophils Relative: 0.3 % (ref 0.0–3.0)
Eosinophils Absolute: 0.1 10*3/uL (ref 0.0–0.7)
Eosinophils Relative: 1.4 % (ref 0.0–5.0)
HCT: 42.5 % (ref 39.0–52.0)
Hemoglobin: 13.8 g/dL (ref 13.0–17.0)
Lymphocytes Relative: 39.2 % (ref 12.0–46.0)
Lymphs Abs: 3.5 10*3/uL (ref 0.7–4.0)
MCHC: 32.5 g/dL (ref 30.0–36.0)
MCV: 89.6 fl (ref 78.0–100.0)
Monocytes Absolute: 0.8 10*3/uL (ref 0.1–1.0)
Monocytes Relative: 8.9 % (ref 3.0–12.0)
Neutro Abs: 4.4 10*3/uL (ref 1.4–7.7)
Neutrophils Relative %: 50.2 % (ref 43.0–77.0)
Platelets: 294 10*3/uL (ref 150.0–400.0)
RBC: 4.74 Mil/uL (ref 4.22–5.81)
RDW: 14.5 % (ref 11.5–15.5)
WBC: 8.8 10*3/uL (ref 4.0–10.5)

## 2015-05-29 LAB — TSH: TSH: 1.8 u[IU]/mL (ref 0.35–4.50)

## 2015-05-29 MED ORDER — LISINOPRIL-HYDROCHLOROTHIAZIDE 20-12.5 MG PO TABS: 1.0000 | ORAL_TABLET | Freq: Every day | ORAL | Status: DC

## 2015-05-29 MED ORDER — VERAPAMIL HCL ER 240 MG PO TBCR: 240.0000 mg | EXTENDED_RELEASE_TABLET | Freq: Every day | ORAL | Status: DC

## 2015-05-29 NOTE — Progress Notes (Signed)
      Patient Care Team: [name], MD as Attending Physician (Cardiology)   HPI  [name] Fields is a 54 y.o. male Seen in followup for pacemaker implanted 1999 for high-grade heart block for which he underwent generator replacement in July 2011.   He is having recurrent palpitations. Got LH   Was not on meds at the time, had run out and had problems with our office getting them refilled  Device interrogation demonstrated 23 % V pacing even with AV delay of 350 msec   He is have DOE; echo 10/14>.Normal LV function  He has had tr edema;  SOB stable     Past Medical History  Diagnosis Date  . WPW (Wolff-Parkinson-White syndrome)     s/pRFCA  . Hiatal hernia   . Hypertension   . Atrial tachycardia   . Permanent pacemaker-St. Jude      DOI 1999; gen re in the summer he has o pain syndrome BP 135% at time long so is his we have been short of his device is in a in a in a Foley disease since the stents removed about 3.0-4 seconds on the episode the patient is very 75 and a [place] when his pacemaker but when he and his placement 2011  . High-grade second-degree heart block      Qualifier: Diagnosis of  By: Caryl Comes, MD, Remus Blake   . Cardiomyopathy, nonischemic     Past Surgical History  Procedure Laterality Date  . Pacemaker insertion  1999    explanted 2011  . Cardiac defibrillator placement  2011    St Jude -- [employer]ent RF    Current Outpatient Prescriptions  Medication Sig Dispense Refill  . lisinopril (PRINIVIL,ZESTRIL) 20 MG tablet Take 1 tablet (20 mg total) by mouth daily. 90 tablet 0  . Multiple Vitamins-Minerals (MULTIVITAMIN WITH MINERALS) tablet Take 1 tablet by mouth daily.    . verapamil (CALAN-SR) 180 MG CR tablet Take 1 tablet (180 mg total) by mouth at bedtime. 90 tablet 0   No current facility-administered medications for this visit.    No Known Allergies  Review of Systems negative except from HPI and PMH  Physical Exam BP 160/100 mmHg   Pulse 77  Ht 6\' 1"  (1.854 m)  Wt 257 lb 9.6 oz (116.847 kg)  BMI 33.99 kg/m2 Well developed and well nourished in no acute distress HENT normal E scleral and icterus clear Neck Supple JVP flat; carotids brisk and full Clear to ausculation Device pocket well healed; without hematoma or erythema.  There is no tethering   Regular rate and rhythm, no murmurs gallops or rub Soft with active bowel sounds No clubbing cyanosis  Edema Alert and oriented, grossly normal motor and sensory function Skin Warm and Dry    Assessment and  Plan  2AVBlock intermittent  Pacemaker St Jude  Cardiomyopathy-resolved  Hypertension  SVT --appears Atrial tach  Depression  Will renew BP meds and increase lisinopril 20>>20/12.5 and verapamil 120>>180 and the importance of compliance   Atrial tach identified on device assoc withsymtoms of LH  He was not on meds at the time   We have discussed antidepressants and role of counseling  He would like ot hold off onthis at present   will check CBC/ TSH with SVT and BMET with ACE Rx   We spent more than 50% of our >30 min visit in face to face counseling regarding the above

## 2015-05-29 NOTE — Patient Instructions (Addendum)
Medication Instructions:  Your physician has recommended you make the following change in your medication:  1) START Lisinopril-HCTZ 20/12.5 - Take one tablet by mouth daily 2) INCREASE Verapamil HCL to 240 mg daily   Labwork: Your physician recommends that you return for lab work TODAY. (BMET,CBC, TSH)   Testing/Procedures: NONE  Follow-Up: Your physician wants you to follow-up in: 61 months with Chanetta Marshall, NP You will receive a reminder letter in the mail two months in advance. If you don't receive a letter, please call our office to schedule the follow-up appointment.  Remote monitoring is used to monitor your Pacemaker or ICD from home. This monitoring reduces the number of office visits required to check your device to one time per year. It allows [place] to keep an eye on the functioning of your device to ensure it is working properly. You are scheduled for a device check from home on 08/31/2015. You may send your transmission at any time that day. If you have a wireless device, the transmission will be sent automatically. After your physician reviews your transmission, you will receive a postcard with your next transmission date.    Any Other Special Instructions Will Be Listed Below (If Applicable).

## 2015-06-07 ENCOUNTER — Telehealth: Payer: Self-pay | Admitting: Cardiology

## 2015-06-07 NOTE — Telephone Encounter (Signed)
Pt called with swelling of lips and lt jaw.  He is in [place], his lisinopril had been increased by adding HCTZ,  He has been on lisinopril for a few years.  I asked him to go to urgent care and be seen - they would direct him and perhaps treat with steroids.  He was agreeable.

## 2015-06-08 ENCOUNTER — Telehealth: Payer: Self-pay

## 2015-06-08 NOTE — Telephone Encounter (Signed)
Patient called in stating that he had an allergic reaction to his Lisinopril (upper and lower lips swelling and face).  He went to an Urgent Care in [place] over the weekend and they told him to stop taking the medication and follow up with his physician.

## 2015-06-08 NOTE — Telephone Encounter (Signed)
Spoke with patient patient and he stated he was feeling better Patient did state that the MD in [place] d/c [place] and gave him Rx for HCTZ 25 mg daily Patient has had his HCTZ today  Will forward to Dr Caryl Comes for review

## 2015-06-08 NOTE — Telephone Encounter (Signed)
Will forward to Dr. Caryl Comes to review. The patient was seen on 05/29/15 by Dr. Caryl Comes. He had previously been on lisinopril, but at his last office visit, was changed to lisiopril/hctz. He is calling today to report that he was seen in Urgent Care in [place] over the weekend.

## 2015-06-09 MED ORDER — HYDROCHLOROTHIAZIDE 25 MG PO TABS: 25.0000 mg | ORAL_TABLET | Freq: Every day | ORAL | Status: DC

## 2015-06-09 NOTE — Telephone Encounter (Signed)
Reviewed with Dr. Caryl Comes. Will update chart stopping Lisinopril and taking HCTZ.

## 2015-06-10 ENCOUNTER — Telehealth: Payer: Self-pay

## 2015-06-10 NOTE — Telephone Encounter (Signed)
Patient has not been taking his blood pressure.  He does not have a PCP. Advised him to look for PCP being that sometimes it can take several months to get in to establish care with them.  We will advise in the meantime. Advised patient to monitor this, at different times of the day, for the next week.   He will call me next week with numbers and advisement of plan of care. He is aware to call office if numbers are too high before then. Patient verbalized understanding and agreeable to plan.

## 2015-06-10 NOTE — Telephone Encounter (Signed)
Patient called in asking if he needs to come in for follow up since the Lisinopril was discontinued.  Will there be another Blood pressure med be prescribed?  Please call patient back to advise.  [phone].

## 2015-08-03 ENCOUNTER — Telehealth: Payer: Self-pay | Admitting: *Deleted

## 2015-08-03 NOTE — Telephone Encounter (Signed)
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

## 2015-08-03 NOTE — Telephone Encounter (Signed)
Informed patient that Dr. 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 Maudie Mercury, in medical records, faxed pt's last echo, interrogation, and OV to below office - per pt request. Patient is aware that we have sent these records. He will call back if anything further is needed.

## 2015-08-05 ENCOUNTER — Telehealth: Payer: Self-pay | Admitting: *Deleted

## 2015-08-05 ENCOUNTER — Other Ambulatory Visit: Payer: Self-pay

## 2015-08-05 MED ORDER — VERAPAMIL HCL ER 240 MG PO TBCR: 240.0000 mg | EXTENDED_RELEASE_TABLET | Freq: Every day | ORAL | Status: DC

## 2015-08-05 NOTE — Telephone Encounter (Signed)
Pt requesting a signed letter from Dr. Caryl Comes stating he is able to work. 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 Pt realized Dr. Caryl Comes cannot authorize a letter such as that. Pt only needs a letter stating his pacemaker is functioning normally.   Pt also requested verapamil refill at [facility] in [place], [place]. Request given to refill dept.

## 2015-08-06 ENCOUNTER — Encounter: Payer: Self-pay | Admitting: *Deleted

## 2015-08-31 ENCOUNTER — Encounter: Payer: Self-pay | Admitting: *Deleted

## 2015-09-02 ENCOUNTER — Encounter: Payer: Self-pay | Admitting: Cardiology

## 2015-10-27 ENCOUNTER — Encounter: Payer: Self-pay | Admitting: *Deleted

## 2015-11-03 ENCOUNTER — Telehealth: Payer: Self-pay | Admitting: Internal Medicine

## 2015-11-03 NOTE — Telephone Encounter (Signed)
Last check was in 05-2015. Patient states that he is unable to send a remote bc his monitor is in [place] and he is currently on the road working. Patient states that he will be in [place] on Thursday. Appt made for 11/5 @ 1330. Patient voiced understanding and states that he will call back if he can't make it.

## 2015-11-03 NOTE — Telephone Encounter (Signed)
New problem    Pt only needs a letter stating his pacemaker is functioning normally. please advise

## 2015-11-05 ENCOUNTER — Telehealth: Payer: Self-pay | Admitting: *Deleted

## 2015-11-05 NOTE — Telephone Encounter (Signed)
Called patient to reschedule 8:00am appointment with device clinic on 11/06/15 to 9:00am.  Patient is agreeable.

## 2015-11-06 ENCOUNTER — Encounter: Payer: Self-pay | Admitting: Internal Medicine

## 2015-11-06 ENCOUNTER — Encounter: Payer: Self-pay | Admitting: *Deleted

## 2015-11-06 ENCOUNTER — Ambulatory Visit (INDEPENDENT_AMBULATORY_CARE_PROVIDER_SITE_OTHER): Payer: Self-pay | Admitting: *Deleted

## 2015-11-06 DIAGNOSIS — I471 Supraventricular tachycardia: Secondary | ICD-10-CM

## 2015-11-06 DIAGNOSIS — I440 Atrioventricular block, first degree: Secondary | ICD-10-CM

## 2015-11-06 LAB — CUP PACEART INCLINIC DEVICE CHECK
Battery Remaining Longevity: 99.6
Battery Voltage: 2.93 V
Brady Statistic RA Percent Paced: 0.37 %
Brady Statistic RV Percent Paced: 28 %
Date Time Interrogation Session: 20170106093122
Implantable Lead Implant Date: 19990719
Implantable Lead Implant Date: 19990719
Implantable Lead Location: 753859
Implantable Lead Location: 753860
Lead Channel Impedance Value: 362.5 Ohm
Lead Channel Impedance Value: 375 Ohm
Lead Channel Pacing Threshold Amplitude: 0.5 V
Lead Channel Pacing Threshold Amplitude: 0.5 V
Lead Channel Pacing Threshold Amplitude: 0.75 V
Lead Channel Pacing Threshold Amplitude: 0.75 V
Lead Channel Pacing Threshold Pulse Width: 0.5 ms
Lead Channel Pacing Threshold Pulse Width: 0.5 ms
Lead Channel Pacing Threshold Pulse Width: 0.7 ms
Lead Channel Pacing Threshold Pulse Width: 0.7 ms
Lead Channel Sensing Intrinsic Amplitude: 11.3 mV
Lead Channel Sensing Intrinsic Amplitude: 4.9 mV
Lead Channel Setting Pacing Amplitude: 2 V
Lead Channel Setting Pacing Amplitude: 2.5 V
Lead Channel Setting Pacing Pulse Width: 0.5 ms
Lead Channel Setting Sensing Sensitivity: 2 mV
Pulse Gen Model: 2210
Pulse Gen Serial Number: 7152165

## 2015-11-06 NOTE — Progress Notes (Signed)
Pacemaker check in clinic. Normal device function. Thresholds, sensing, impedances consistent with previous measurements. Device programmed to maximize longevity. (16) mode switches (<1%)---max dur. 16 mins 10 sec, Max A 233---SVT per EGMs. (15) high ventricular rates noted---1:1 SVT per EGMs + Verapamil 240. Device programmed at appropriate safety margins. Histogram distribution appropriate for patient activity level. Device programmed to optimize intrinsic conduction. Estimated longevity 6.3-8.3 years. Patient will follow up with AS in 04-2016.

## 2016-10-10 ENCOUNTER — Telehealth: Payer: Self-pay | Admitting: Internal Medicine

## 2016-10-10 NOTE — Telephone Encounter (Signed)
Spoke to patient regarding incident on Friday. Patient states that he was changing out the fuse box at his father's house when he got shocked by one of the wires. Patient wanted to check to make sure that his ppm hadn't been affected. Patient states that he currently feels fine. I asked patient to send in a remote for review. 800# for Merlin given in case patient has trouble sending remote.

## 2016-10-10 NOTE — Telephone Encounter (Signed)
New Message   1. Has your device fired?  Yes Friday 12/8  2. Is you device beeping? No   3. Are you experiencing draining or swelling at device site?no   4. Are you calling to see if we received your device transmission? no  5. Have you passed out? No

## 2016-10-10 NOTE — Telephone Encounter (Signed)
Called patient back regarding ppm.   Patient asked that I call him back in 10 mins.

## 2016-10-10 NOTE — Telephone Encounter (Signed)
Patient called back with a question about his Bp medication. Patient states that he had an allergic reaction to a medication that he had been taking and didn't know if he should be on something different. I told patient that he would need an appt with Dr.Klein since he is overdue and we do not have a recent reading. Patient voiced understanding.   Will defer scheduling to [facility].  I asked patient if he had attempted to send a remote. 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   I encouraged patient to go to local ER if notices any changes. Patient voiced understanding.

## 2016-10-10 NOTE — Telephone Encounter (Signed)
Follow Up:    Pt was disconnected,please call.

## 2016-10-12 ENCOUNTER — Encounter: Payer: Self-pay | Admitting: Internal Medicine

## 2016-10-19 ENCOUNTER — Encounter: Payer: Self-pay | Admitting: Internal Medicine

## 2016-10-19 ENCOUNTER — Ambulatory Visit (INDEPENDENT_AMBULATORY_CARE_PROVIDER_SITE_OTHER): Payer: Self-pay | Admitting: Internal Medicine

## 2016-10-19 ENCOUNTER — Encounter (INDEPENDENT_AMBULATORY_CARE_PROVIDER_SITE_OTHER): Payer: Self-pay

## 2016-10-19 ENCOUNTER — Encounter: Payer: Self-pay | Admitting: *Deleted

## 2016-10-19 VITALS — BP 160/100 | HR 81 | Ht 73.0 in | Wt 269.0 lb

## 2016-10-19 DIAGNOSIS — I471 Supraventricular tachycardia: Secondary | ICD-10-CM

## 2016-10-19 DIAGNOSIS — Z950 Presence of cardiac pacemaker: Secondary | ICD-10-CM

## 2016-10-19 DIAGNOSIS — I441 Atrioventricular block, second degree: Secondary | ICD-10-CM

## 2016-10-19 MED ORDER — HYDROCHLOROTHIAZIDE 25 MG PO TABS: 25.0000 mg | ORAL_TABLET | Freq: Every day | ORAL | 3 refills | Status: DC

## 2016-10-19 NOTE — Patient Instructions (Signed)
Medication Instructions: - Your physician has recommended you make the following change in your medication:  1) Start hydrochlorothiazide 25 mg- take one tablet by mouth once daily  Labwork: - Your physician recommends that you return for lab work in: 2 weeks- BMP  Procedures/Testing: - none ordered  Follow-Up: - Remote monitoring is used to monitor your Pacemaker of ICD from home. This monitoring reduces the number of office visits required to check your device to one time per year. It allows [place] to keep an eye on the functioning of your device to ensure it is working properly. You are scheduled for a device check from home on 01/18/17. You may send your transmission at any time that day. If you have a wireless device, the transmission will be sent automatically. After your physician reviews your transmission, you will receive a postcard with your next transmission date.  - Your physician wants you to follow-up in: 1 year with Dr. Caryl Comes. You will receive a reminder letter in the mail two months in advance. If you don't receive a letter, please call our office to schedule the follow-up appointment.  Any Additional Special Instructions Will Be Listed Below (If Applicable).     If you need a refill on your cardiac medications before your next appointment, please call your pharmacy.

## 2016-10-19 NOTE — Progress Notes (Signed)
      Patient Care Team: No Pcp Per Patient as PCP - General (General Practice) [name], MD as Attending Physician (Cardiology)   HPI  [name] Fields is a 55 y.o. male Seen in followup for pacemaker implanted 1999 for high-grade heart block for which he underwent generator replacement in July 2011.  ov    He is have mild DOE; echo 10/14>.Normal LV function  He has had tr edema;  SOB stable  No chest pain or palps  He is to get married nov 2018   7/16  Cr 1.04  K  3.9    Past Medical History:  Diagnosis Date  . Atrial tachycardia ([place])   . Cardiomyopathy, nonischemic ([place])   . Hiatal hernia   . High-grade second-degree heart block     Qualifier: Diagnosis of  By: Caryl Comes, MD, Remus Blake   . Hypertension   . Permanent pacemaker-St. Jude     DOI 1999; gen re in the summer he has o pain syndrome BP 135% at time long so is his we have been short of his device is in a in a in a Foley disease since the stents removed about 3.0-4 seconds on the episode the patient is very 54 and a [place] when his pacemaker but when he and his placement 2011  . WPW (Wolff-Parkinson-White syndrome)    s/pRFCA    Past Surgical History:  Procedure Laterality Date  . CARDIAC DEFIBRILLATOR PLACEMENT  2011   St Jude -- [employer]ent RF  . PACEMAKER INSERTION  1999   explanted 2011    Current Outpatient Prescriptions  Medication Sig Dispense Refill  . Multiple Vitamins-Minerals (MULTIVITAMIN WITH MINERALS) tablet Take 1 tablet by mouth daily.    . verapamil (CALAN-SR) 240 MG CR tablet Take 1 tablet (240 mg total) by mouth at bedtime. 90 tablet 3   No current facility-administered medications for this visit.     Allergies  Allergen Reactions  . Hydrochlorothiazide Other (See Comments)    Lip swelling    Review of Systems negative except from HPI and PMH  Physical Exam BP (!) 160/100   Pulse 81   Ht 6\' 1"  (1.854 m)   Wt 269 lb (122 kg)   SpO2 98%   BMI 35.49 kg/m  Well  developed and well nourished in no acute distress HENT normal E scleral and icterus clear Neck Supple JVP flat; carotids brisk and full Clear to ausculation Device pocket well healed; without hematoma or erythema.  There is no tethering   Regular rate and rhythm, no murmurs gallops or rub Soft with active bowel sounds No clubbing cyanosis  Edema Alert and oriented, grossly normal motor and sensory function Skin Warm and Dry  ECG sinus  74\  Assessment and  Plan  2AVBlock intermittent  Pacemaker St Jude  Cardiomyopathy-resolved-- LV hypertrophy  Hypertension -poorly controlled    Continue verapamil  Will begin HCTZ 25   Will check BMET in one week  Not tolerating his CPAP since hsi new machine Will refer to Dr TT for assistance

## 2016-10-20 LAB — CUP PACEART INCLINIC DEVICE CHECK
Battery Voltage: 2.9 V
Brady Statistic RA Percent Paced: 0.85 %
Brady Statistic RV Percent Paced: 27 %
Date Time Interrogation Session: 20171220175458
Implantable Lead Implant Date: 19990719
Implantable Lead Implant Date: 19990719
Implantable Lead Location: 753859
Implantable Lead Location: 753860
Implantable Pulse Generator Implant Date: 20110711
Lead Channel Impedance Value: 312.5 Ohm
Lead Channel Impedance Value: 337.5 Ohm
Lead Channel Pacing Threshold Amplitude: 0.5 V
Lead Channel Pacing Threshold Amplitude: 0.5 V
Lead Channel Pacing Threshold Amplitude: 0.75 V
Lead Channel Pacing Threshold Amplitude: 0.75 V
Lead Channel Pacing Threshold Pulse Width: 0.5 ms
Lead Channel Pacing Threshold Pulse Width: 0.5 ms
Lead Channel Pacing Threshold Pulse Width: 0.7 ms
Lead Channel Pacing Threshold Pulse Width: 0.7 ms
Lead Channel Sensing Intrinsic Amplitude: 10.4 mV
Lead Channel Sensing Intrinsic Amplitude: 3.5 mV
Lead Channel Setting Pacing Amplitude: 2 V
Lead Channel Setting Pacing Amplitude: 2.5 V
Lead Channel Setting Pacing Pulse Width: 0.5 ms
Lead Channel Setting Sensing Sensitivity: 2 mV
Pulse Gen Model: 2210
Pulse Gen Serial Number: 7152165

## 2016-10-27 ENCOUNTER — Telehealth: Payer: Self-pay | Admitting: *Deleted

## 2016-10-27 NOTE — Telephone Encounter (Signed)
Patient called today stating he was suppose to have an appointment with Dr Radford Pax to follow his sleep PER  Dr Caryl Comes. I DID find Dr Aquilla Hacker last note stating that the patient was " Not tolerating his CPAP since his new machine,  Will refer to Dr TT for assistance." I haven't received anything concerning this patient having an appointment with DR Radford Pax. He thinks he is getting the run around. Please advise. Thanks, Gae Bon

## 2016-11-03 ENCOUNTER — Other Ambulatory Visit: Payer: Self-pay | Admitting: *Deleted

## 2016-11-03 DIAGNOSIS — I10 Essential (primary) hypertension: Secondary | ICD-10-CM

## 2016-11-03 NOTE — Addendum Note (Signed)
Addended by: Eulis Foster on: 11/03/2016 01:04 PM   Modules accepted: Orders

## 2016-11-04 ENCOUNTER — Other Ambulatory Visit: Payer: Self-pay

## 2016-11-04 LAB — BASIC METABOLIC PANEL
BUN/Creatinine Ratio: 10 (ref 9–20)
BUN: 14 mg/dL (ref 6–24)
CO2: 21 mmol/L (ref 18–29)
Calcium: 10 mg/dL (ref 8.7–10.2)
Chloride: 97 mmol/L (ref 96–106)
Creatinine, Ser: 1.34 mg/dL — ABNORMAL HIGH (ref 0.76–1.27)
GFR calc Af Amer: 68 mL/min/{1.73_m2} (ref >59)
GFR calc non Af Amer: 59 mL/min/{1.73_m2} — ABNORMAL LOW (ref >59)
Glucose: 89 mg/dL (ref 65–99)
Potassium: 4.4 mmol/L (ref 3.5–5.2)
Sodium: 137 mmol/L (ref 134–144)

## 2016-11-07 ENCOUNTER — Telehealth: Payer: Self-pay | Admitting: Internal Medicine

## 2016-11-07 ENCOUNTER — Other Ambulatory Visit: Payer: Self-pay | Admitting: *Deleted

## 2016-11-07 DIAGNOSIS — I429 Cardiomyopathy, unspecified: Secondary | ICD-10-CM

## 2016-11-07 NOTE — Telephone Encounter (Signed)
I spoke with the patient regarding his lab results. He will come back on 1/31 for a repeat BMP to look at his renal function.

## 2016-11-07 NOTE — Telephone Encounter (Signed)
New message ° ° ° ° °Returning a call to the nurse °

## 2016-11-22 ENCOUNTER — Ambulatory Visit: Payer: Self-pay | Admitting: Cardiology

## 2016-11-25 ENCOUNTER — Encounter: Payer: Self-pay | Admitting: Cardiology

## 2016-11-30 ENCOUNTER — Other Ambulatory Visit: Payer: Self-pay | Admitting: *Deleted

## 2016-11-30 DIAGNOSIS — I429 Cardiomyopathy, unspecified: Secondary | ICD-10-CM

## 2016-12-01 LAB — BASIC METABOLIC PANEL
BUN/Creatinine Ratio: 13 (ref 9–20)
BUN: 14 mg/dL (ref 6–24)
CO2: 20 mmol/L (ref 18–29)
Calcium: 9.7 mg/dL (ref 8.7–10.2)
Chloride: 96 mmol/L (ref 96–106)
Creatinine, Ser: 1.05 mg/dL (ref 0.76–1.27)
GFR calc Af Amer: 92 mL/min/{1.73_m2} (ref >59)
GFR calc non Af Amer: 80 mL/min/{1.73_m2} (ref >59)
Glucose: 79 mg/dL (ref 65–99)
Potassium: 4.5 mmol/L (ref 3.5–5.2)
Sodium: 138 mmol/L (ref 134–144)

## 2016-12-22 ENCOUNTER — Ambulatory Visit: Payer: Self-pay | Admitting: Cardiology

## 2016-12-27 ENCOUNTER — Ambulatory Visit: Payer: Self-pay | Admitting: Cardiology

## 2016-12-28 ENCOUNTER — Encounter (INDEPENDENT_AMBULATORY_CARE_PROVIDER_SITE_OTHER): Payer: Self-pay

## 2016-12-28 ENCOUNTER — Ambulatory Visit (INDEPENDENT_AMBULATORY_CARE_PROVIDER_SITE_OTHER): Payer: Self-pay | Admitting: Cardiology

## 2016-12-28 VITALS — BP 138/80 | HR 88 | Ht 73.0 in | Wt 266.8 lb

## 2016-12-28 DIAGNOSIS — G4733 Obstructive sleep apnea (adult) (pediatric): Secondary | ICD-10-CM

## 2016-12-28 DIAGNOSIS — I10 Essential (primary) hypertension: Secondary | ICD-10-CM

## 2016-12-28 DIAGNOSIS — J351 Hypertrophy of tonsils: Secondary | ICD-10-CM

## 2016-12-28 DIAGNOSIS — E669 Obesity, unspecified: Secondary | ICD-10-CM
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
## 2016-12-28 NOTE — Patient Instructions (Signed)
Medication Instructions:  Your physician recommends that you continue on your current medications as directed. Please refer to the Current Medication list given to you today.   Labwork: None  Testing/Procedures: None  Follow-Up: You have been referred to Dr. [name] Obey (ENT) for enlarged tonsils.  Your physician wants you to follow-up in: 1 year with Dr. Radford Pax. You will receive a reminder letter in the mail two months in advance. If you don't receive a letter, please call our office to schedule the follow-up appointment.   Any Other Special Instructions Will Be Listed Below (If Applicable). Dr. Radford Pax has ordered for you a new CPAP and mask.  Gae Bon is our office CPAP assistant. Please call her directly at [phone] for any CPAP questions or concerns.    If you need a refill on your cardiac medications before your next appointment, please call your pharmacy.

## 2016-12-29 ENCOUNTER — Telehealth: Payer: Self-pay | Admitting: *Deleted

## 2016-12-29 NOTE — Telephone Encounter (Signed)
Order sent to [facility] Order for Resmed CPAP set at 8 cm H20 with airfit F20 XL mask and heated humidity. Please enroll in [place]. Must have ability to see AHI.

## 2017-01-18 ENCOUNTER — Telehealth: Payer: Self-pay | Admitting: Cardiology

## 2017-01-18 ENCOUNTER — Encounter: Payer: Self-pay | Admitting: *Deleted

## 2017-01-18 NOTE — Telephone Encounter (Signed)
Spoke with pt and reminded pt of remote transmission that is due today. Pt verbalized understanding.   

## 2017-01-20 ENCOUNTER — Encounter: Payer: Self-pay | Admitting: Cardiology

## 2017-07-13 ENCOUNTER — Other Ambulatory Visit: Payer: Self-pay | Admitting: Family

## 2017-07-13 DIAGNOSIS — R3121 Asymptomatic microscopic hematuria: Secondary | ICD-10-CM

## 2017-07-17 ENCOUNTER — Ambulatory Visit
Admission: RE | Admit: 2017-07-17 | Discharge: 2017-07-17 | Disposition: A | Discharge status: Home / Self Care | Payer: No Typology Code available for payment source | Source: Ambulatory Visit | Attending: Family | Admitting: Family

## 2017-07-17 DIAGNOSIS — R3121 Asymptomatic microscopic hematuria: Secondary | ICD-10-CM

## 2017-07-27 ENCOUNTER — Telehealth: Payer: Self-pay | Admitting: Cardiology

## 2017-07-27 ENCOUNTER — Ambulatory Visit (INDEPENDENT_AMBULATORY_CARE_PROVIDER_SITE_OTHER): Payer: Self-pay | Admitting: *Deleted

## 2017-07-27 DIAGNOSIS — I441 Atrioventricular block, second degree: Secondary | ICD-10-CM

## 2017-07-27 NOTE — Telephone Encounter (Signed)
Spoke with pt and reminded pt of remote transmission that is due today. Pt verbalized understanding.   

## 2017-07-28 NOTE — Progress Notes (Signed)
Remote pacemaker transmission.   

## 2017-07-31 ENCOUNTER — Telehealth: Payer: Self-pay | Admitting: Internal Medicine

## 2017-07-31 ENCOUNTER — Other Ambulatory Visit: Payer: Self-pay | Admitting: Family

## 2017-07-31 DIAGNOSIS — R3121 Asymptomatic microscopic hematuria: Secondary | ICD-10-CM

## 2017-07-31 DIAGNOSIS — R9389 Abnormal findings on diagnostic imaging of other specified body structures: Secondary | ICD-10-CM

## 2017-07-31 LAB — CUP PACEART REMOTE DEVICE CHECK
Battery Remaining Longevity: 62 mo
Battery Remaining Percentage: 57 %
Battery Voltage: 2.89 V
Brady Statistic AP VP Percent: 1 %
Brady Statistic AP VS Percent: 1 %
Brady Statistic AS VP Percent: 21 %
Brady Statistic AS VS Percent: 77 %
Brady Statistic RA Percent Paced: 1 %
Brady Statistic RV Percent Paced: 22 %
Date Time Interrogation Session: 20180927165112
Implantable Lead Implant Date: 19990719
Implantable Lead Implant Date: 19990719
Implantable Lead Location: 753859
Implantable Lead Location: 753860
Implantable Pulse Generator Implant Date: 20110711
Lead Channel Impedance Value: 360 Ohm
Lead Channel Impedance Value: 360 Ohm
Lead Channel Pacing Threshold Amplitude: 0.5 V
Lead Channel Pacing Threshold Amplitude: 0.75 V
Lead Channel Pacing Threshold Pulse Width: 0.5 ms
Lead Channel Pacing Threshold Pulse Width: 0.7 ms
Lead Channel Sensing Intrinsic Amplitude: 3.8 mV
Lead Channel Sensing Intrinsic Amplitude: 9.7 mV
Lead Channel Setting Pacing Amplitude: 2 V
Lead Channel Setting Pacing Amplitude: 2.5 V
Lead Channel Setting Pacing Pulse Width: 0.5 ms
Lead Channel Setting Sensing Sensitivity: 2 mV
Pulse Gen Model: 2210
Pulse Gen Serial Number: 7152165

## 2017-07-31 NOTE — Telephone Encounter (Signed)
New Message      1. Has your device fired? no  2. Is you device beeping? no  3. Are you experiencing draining or swelling at device site?  no  4. Are you calling to see if we received your device transmission? Yes and what did it say   5. Have you passed out?no    Please route to [place]

## 2017-07-31 NOTE — Telephone Encounter (Signed)
Informed patient that remote was received and device function is stable. I informed him of his battery longevity. I also informed him of recent episodes. Patient verbalized understanding and appreciation of information.

## 2017-08-01 ENCOUNTER — Encounter: Payer: Self-pay | Admitting: Cardiology

## 2017-08-07 ENCOUNTER — Telehealth: Payer: Self-pay | Admitting: Internal Medicine

## 2017-08-07 NOTE — Telephone Encounter (Signed)
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

## 2017-08-07 NOTE — Telephone Encounter (Signed)
Pt aware Dr Caryl Comes is out of the office until 08/15/17.  Pt asking for a copy of letter done 10/19/16 by Dr Caryl Comes (in [place]) and also a letter from [facility] stating transmission done 07/31/17 was okay.   Pt advised I will forward to Medical Records to help with getting copy of letter done 10/19/16 placed at front desk for him to pick up this Thursday October 11.   Pt advised I will forward to [facility] to help with getting letter about transmission done 07/31/17 for him to pick up this Thursday October 11

## 2017-08-07 NOTE — Telephone Encounter (Incomplete)
Pt aware Dr Caryl Comes is out of the office until 08/15/17.  Pt asking for a copy of letter done 10/19/16 by Dr Caryl Comes (in [place]) and also a letter from [facility] stating transmission done 07/31/17 was okay.   Pt advised I will forward to Medical Records to help with getting copy of letter done 10/19/16 placed at front desk for him to pick up this Thursday October 11. Pt advised I will forward to [facility] to help with getting letter

## 2017-08-10 ENCOUNTER — Encounter: Payer: Self-pay | Admitting: Cardiology

## 2017-08-11 ENCOUNTER — Other Ambulatory Visit: Payer: Self-pay

## 2017-08-31 ENCOUNTER — Other Ambulatory Visit: Payer: Self-pay

## 2017-09-14 ENCOUNTER — Other Ambulatory Visit: Payer: Self-pay | Admitting: Internal Medicine

## 2017-09-14 MED ORDER — HYDROCHLOROTHIAZIDE 25 MG PO TABS: 25.0000 mg | ORAL_TABLET | Freq: Every day | ORAL | 0 refills | Status: DC

## 2017-09-14 NOTE — Telephone Encounter (Signed)
Pt's medication was sent to pt's pharmacy as requested. Confirmation received.  °

## 2017-10-27 ENCOUNTER — Telehealth: Payer: Self-pay | Admitting: Cardiology

## 2017-10-27 ENCOUNTER — Encounter: Payer: Self-pay | Admitting: *Deleted

## 2017-10-27 NOTE — Telephone Encounter (Signed)
LMOVM reminding pt to send remote transmission.   

## 2017-10-30 ENCOUNTER — Encounter: Payer: Self-pay | Admitting: Cardiology

## 2017-11-06 ENCOUNTER — Telehealth: Payer: Self-pay | Admitting: Internal Medicine

## 2017-11-06 NOTE — Telephone Encounter (Signed)
°*  STAT* If patient is at the pharmacy, call can be transferred to refill team.   1. Which medications need to be refilled? (please list name of each medication and dose if known) Hydrochlorothiazide 25 mg   2. Which pharmacy/location (including street and city if local pharmacy) is medication to be sent to? [facility] [address] [place] [address]  [phone]   3. Do they need a 30 day or 90 day supply? 90 days  Patient is a truck driver and travels for work, patient would like to pick up medication within 2 hours as he will be stopping by this location.  instructed to route call to triage.

## 2017-11-06 NOTE — Telephone Encounter (Signed)
Called in refill for patient to a pharmacy in [place] for a 30 day with one refill for Hydrochlorothiazide 25 mg by mouth daily. Patient last saw Dr. Radford Pax in February of last year and has a f/u with Tommye Standard PA on 11/15/17. Called patient to let him know that refill was sent in, left message for patient to call back if he had any questions.

## 2017-11-08 ENCOUNTER — Encounter: Payer: Self-pay | Admitting: Internal Medicine

## 2017-11-13 NOTE — Progress Notes (Deleted)
Cardiology Office Note Date:  11/13/2017  Patient ID:  [name] Fields, DOB Feb 23, 1961, MRN [id number] PCP:  Patient, No Pcp Per  Cardiologist:  Dr. Radford Pax Electrophysiologist; Dr. Caryl Comes  ***refresh   Chief Complaint:  annual EP visit  History of Present Illness: [name] Fields is a 57 y.o. male with history of high degree AVBlock w/PPM, hx of WPW ablated, OSA w/CPAP, HTN, hx of NICM with recovered LVEF.  He comes today to be seen for Dr. Caryl Comes, last seen by him in Dec 2017, at that time had some edema, BP was elevated HCTZ was added, no symptoms.  *** fluid status *** labs/lipids/BMET *** meds *** symptoms *** CPAP  Device information: SJM dual chamber PPM, initial implant 1999, gen change 2011   Past Medical History:  Diagnosis Date  . Allergy to ACE inhibitors   . Atrial tachycardia ([place])   . Cardiomyopathy, nonischemic ([place])   . Hiatal hernia   . High-grade second-degree heart block     Qualifier: Diagnosis of  By: Caryl Comes, MD, Remus Blake   . Hypertension   . Permanent pacemaker-St. Jude     DOI 1999; gen re in the summer he has o pain syndrome BP 135% at time long so is his we have been short of his device is in a in a in a Foley disease since the stents removed about 3.0-4 seconds on the episode the patient is very 37 and a [place] when his pacemaker but when he and his placement 2011  . WPW (Wolff-Parkinson-White syndrome)    s/pRFCA    Past Surgical History:  Procedure Laterality Date  . CARDIAC DEFIBRILLATOR PLACEMENT  2011   St Jude -- Accent RF  . PACEMAKER INSERTION  1999   explanted 2011    Current Outpatient Medications  Medication Sig Dispense Refill  . hydrochlorothiazide (HYDRODIURIL) 25 MG tablet Take 1 tablet (25 mg total) daily by mouth. Please make appt with Dr. Caryl Comes for December before anymore refills. 1st attempt 30 tablet 0  . Multiple Vitamins-Minerals (MULTIVITAMIN WITH MINERALS) tablet Take 1 tablet by mouth daily.    . Omega-3  Fatty Acids (FISH OIL) 1000 MG CAPS Take 1,000 mg by mouth daily.    . verapamil (CALAN-SR) 240 MG CR tablet Take 1 tablet (240 mg total) by mouth at bedtime. 90 tablet 3   No current facility-administered medications for this visit.     Allergies:   Ace inhibitors   Social History:  The patient  reports that he has been smoking cigarettes.  He has a 5.00 pack-year smoking history. he has never used smokeless tobacco. He reports that he drinks alcohol. He reports that he does not use drugs.   Family History:  The patient's family history includes Cancer in his mother; Heart Problems in his mother; Heart disease in his mother; Hypertension in his father.  ROS:  Please see the history of present illness.  All other systems are reviewed and otherwise negative.   PHYSICAL EXAM: *** VS:  There were no vitals taken for this visit. BMI: There is no height or weight on file to calculate BMI. Well nourished, well developed, in no acute distress  HEENT: normocephalic, atraumatic  Neck: no JVD, carotid bruits or masses Cardiac:  *** RRR; no significant murmurs, no rubs, or gallops Lungs: *** CTA b/l, no wheezing, rhonchi or rales  Abd: soft, nontender MS: no deformity or *** atrophy Ext: *** no edema  Skin: warm and dry, no rash Neuro:  No gross deficits appreciated Psych: euthymic mood, full affect  *** PPM site is stable, no tethering or discomfort   EKG:  Done today and reviewed by myself *** PPM interrogation done today and reviewed by myself: ***  08/08/13: TTE Study Conclusions Left ventricle: Systolic function was normal. The estimated ejection fraction was in the range of 60% to 65%. Wall motion was normal; there were no regional wall motion abnormalities.  Recent Labs: 11/30/2016: BUN 14; Creatinine, Ser 1.05; Potassium 4.5; Sodium 138  No results found for requested labs within last 8760 hours.   CrCl cannot be calculated (Patient's most recent lab result is older than the  maximum 21 days allowed.).   Wt Readings from Last 3 Encounters:  12/28/16 266 lb 12.8 oz (121 kg)  10/19/16 269 lb (122 kg)  05/29/15 257 lb 9.6 oz (116.8 kg)     Other studies reviewed: Additional studies/records reviewed today include: summarized above  ASSESSMENT AND PLAN:  1. PPM     ***  2. HTN     ***   Disposition: F/u with ***  Current medicines are reviewed at length with the patient today.  The patient did not have any concerns regarding medicines.***  Signed, Tommye Standard, PA-C 11/13/2017 10:17 AM     [facility] [place]  [id number] [phone] (office)  [phone] (fax)

## 2017-11-15 ENCOUNTER — Encounter: Payer: Self-pay | Admitting: Physician Assistant

## 2017-11-24 NOTE — Telephone Encounter (Signed)
Closed Encounter  °

## 2017-11-26 NOTE — Progress Notes (Deleted)
Cardiology Office Note Date:  11/26/2017  Patient ID:  [name] Fields, DOB 02/13/1961, MRN [id number] PCP:  Patient, No Pcp Per  Cardiologist:  Dr. Radford Pax Electrophysiologist; Dr. Caryl Comes  ***refresh   Chief Complaint:  annual EP visit  History of Present Illness: [name] Fields is a 57 y.o. male with history of high degree AVBlock w/PPM, hx of WPW ablated, OSA w/CPAP, HTN, hx of NICM with recovered LVEF.  He comes today to be seen for Dr. Caryl Comes, last seen by him in Dec 2017, at that time had some edema, BP was elevated HCTZ was added, no symptoms.  *** fluid status *** labs/lipids/BMET *** meds *** symptoms *** CPAP  Device information: SJM dual chamber PPM, initial implant 1999, gen change 2011   Past Medical History:  Diagnosis Date  . Allergy to ACE inhibitors   . Atrial tachycardia ([place])   . Cardiomyopathy, nonischemic ([place])   . Hiatal hernia   . High-grade second-degree heart block     Qualifier: Diagnosis of  By: Caryl Comes, MD, Remus Blake   . Hypertension   . Permanent pacemaker-St. Jude     DOI 1999; gen re in the summer he has o pain syndrome BP 135% at time long so is his we have been short of his device is in a in a in a Foley disease since the stents removed about 3.0-4 seconds on the episode the patient is very 13 and a [place] when his pacemaker but when he and his placement 2011  . WPW (Wolff-Parkinson-White syndrome)    s/pRFCA    Past Surgical History:  Procedure Laterality Date  . CARDIAC DEFIBRILLATOR PLACEMENT  2011   St Jude -- Accent RF  . PACEMAKER INSERTION  1999   explanted 2011    Current Outpatient Medications  Medication Sig Dispense Refill  . hydrochlorothiazide (HYDRODIURIL) 25 MG tablet Take 1 tablet (25 mg total) daily by mouth. Please make appt with Dr. Caryl Comes for December before anymore refills. 1st attempt 30 tablet 0  . Multiple Vitamins-Minerals (MULTIVITAMIN WITH MINERALS) tablet Take 1 tablet by mouth daily.    . Omega-3  Fatty Acids (FISH OIL) 1000 MG CAPS Take 1,000 mg by mouth daily.    . verapamil (CALAN-SR) 240 MG CR tablet Take 1 tablet (240 mg total) by mouth at bedtime. 90 tablet 3   No current facility-administered medications for this visit.     Allergies:   Ace inhibitors   Social History:  The patient  reports that he has been smoking cigarettes.  He has a 5.00 pack-year smoking history. he has never used smokeless tobacco. He reports that he drinks alcohol. He reports that he does not use drugs.   Family History:  The patient's family history includes Cancer in his mother; Heart Problems in his mother; Heart disease in his mother; Hypertension in his father.  ROS:  Please see the history of present illness.  All other systems are reviewed and otherwise negative.   PHYSICAL EXAM: *** VS:  There were no vitals taken for this visit. BMI: There is no height or weight on file to calculate BMI. Well nourished, well developed, in no acute distress  HEENT: normocephalic, atraumatic  Neck: no JVD, carotid bruits or masses Cardiac:  *** RRR; no significant murmurs, no rubs, or gallops Lungs: *** CTA b/l, no wheezing, rhonchi or rales  Abd: soft, nontender MS: no deformity or *** atrophy Ext: *** no edema  Skin: warm and dry, no rash Neuro:  No gross deficits appreciated Psych: euthymic mood, full affect  *** PPM site is stable, no tethering or discomfort   EKG:  Done today and reviewed by myself *** PPM interrogation done today and reviewed by myself: ***  08/08/13: TTE Study Conclusions Left ventricle: Systolic function was normal. The estimated ejection fraction was in the range of 60% to 65%. Wall motion was normal; there were no regional wall motion abnormalities.  Recent Labs: 11/30/2016: BUN 14; Creatinine, Ser 1.05; Potassium 4.5; Sodium 138  No results found for requested labs within last 8760 hours.   CrCl cannot be calculated (Patient's most recent lab result is older than the  maximum 21 days allowed.).   Wt Readings from Last 3 Encounters:  12/28/16 266 lb 12.8 oz (121 kg)  10/19/16 269 lb (122 kg)  05/29/15 257 lb 9.6 oz (116.8 kg)     Other studies reviewed: Additional studies/records reviewed today include: summarized above  ASSESSMENT AND PLAN:  1. PPM     ***  2. HTN     ***   Disposition: F/u with ***  Current medicines are reviewed at length with the patient today.  The patient did not have any concerns regarding medicines.***  Signed, Tommye Standard, PA-C 11/26/2017 5:47 PM     [facility] [place] [address] [phone] (office)  [phone] (fax)

## 2017-11-29 ENCOUNTER — Encounter: Payer: Self-pay | Admitting: Physician Assistant

## 2017-12-08 ENCOUNTER — Other Ambulatory Visit: Payer: Self-pay | Admitting: *Deleted

## 2017-12-08 ENCOUNTER — Telehealth: Payer: Self-pay | Admitting: Physician Assistant

## 2017-12-08 MED ORDER — HYDROCHLOROTHIAZIDE 25 MG PO TABS: 25.0000 mg | ORAL_TABLET | Freq: Every day | ORAL | 0 refills | Status: DC

## 2017-12-08 NOTE — Telephone Encounter (Signed)
New message      *STAT* If patient is at the pharmacy, call can be transferred to refill team.   1. Which medications need to be refilled? (please list name of each medication and dose if known) hydrochlorothiazide (HYDRODIURIL) 25 MG tablet  2. Which pharmacy/location (including street and city if local pharmacy) is medication to be sent to? [facility] in [place] on [address].   3. Do they need a 30 day or 90 day supply? 30 days

## 2017-12-11 ENCOUNTER — Encounter: Payer: Self-pay | Admitting: Physician Assistant

## 2017-12-25 NOTE — Progress Notes (Addendum)
Cardiology Office Note Date:  12/28/2017  Patient ID:  [name] Fields, DOB Jan 19, 1961, MRN [id number] PCP:  Patient, No Pcp Per  Cardiologist:  Dr. Radford Pax Electrophysiologist; Dr. Caryl Comes    Chief Complaint:  annual EP visit  History of Present Illness: [name] Fields is a 57 y.o. male with history of high degree AVBlock w/PPM, hx of WPW ablated, OSA w/CPAP, HTN, hx of NICM with recovered LVEF.  He comes today to be seen for Dr. Caryl Comes, last seen by him in Dec 2017, at that time had some edema, BP was elevated HCTZ was added, no symptoms.  He is doing well.  Continues to have brief episodes of feeling his heart race, lasting 30 seconds to a minute, never longer then that, makes him feel hot, a little lightheaded.  Never any near syncope or syncope.  No CP or SOB, denies any DOE, no nighttime symptoms, no PND or orthopnea, reports compliance with his CPAP.  He is not taking Verapamil, not sure for how long, thought it was stopped, but not sure why.  He has been out of his HCTZ for a couple months without refills.  Device information: SJM dual chamber PPM, initial implant 1999, gen change 2011   Past Medical History:  Diagnosis Date  . Allergy to ACE inhibitors   . Atrial tachycardia ([place])   . Cardiomyopathy, nonischemic ([place])   . Hiatal hernia   . High-grade second-degree heart block     Qualifier: Diagnosis of  By: Caryl Comes, MD, Remus Blake   . Hypertension   . Permanent pacemaker-St. Jude     DOI 1999; gen re in the summer he has o pain syndrome BP 135% at time long so is his we have been short of his device is in a in a in a Foley disease since the stents removed about 3.0-4 seconds on the episode the patient is very 22 and a [place] when his pacemaker but when he and his placement 2011  . WPW (Wolff-Parkinson-White syndrome)    s/pRFCA    Past Surgical History:  Procedure Laterality Date  . CARDIAC DEFIBRILLATOR PLACEMENT  2011   St Jude -- [employer] RF  . PACEMAKER  INSERTION  1999   explanted 2011    Current Outpatient Medications  Medication Sig Dispense Refill  . hydrochlorothiazide (HYDRODIURIL) 25 MG tablet Take 1 tablet (25 mg total) by mouth daily. 30 tablet 3  . Multiple Vitamins-Minerals (MULTIVITAMIN WITH MINERALS) tablet Take 1 tablet by mouth daily.    . Omega-3 Fatty Acids (FISH OIL) 1000 MG CAPS Take 1,000 mg by mouth daily.    . verapamil (CALAN-SR) 120 MG CR tablet Take 1 tablet (120 mg total) by mouth at bedtime. 30 tablet 3   No current facility-administered medications for this visit.     Allergies:   Ace inhibitors   Social History:  The patient  reports that he has been smoking cigarettes.  He has a 5.00 pack-year smoking history. he has never used smokeless tobacco. He reports that he drinks alcohol. He reports that he does not use drugs.   Family History:  The patient's family history includes Cancer in his mother; Heart Problems in his mother; Heart disease in his mother; Hypertension in his father.  ROS:  Please see the history of present illness.  All other systems are reviewed and otherwise negative.   PHYSICAL EXAM:  VS:  BP (!) 158/84   Pulse 64   Ht 6\' 1"  (1.854 m)  Wt 266 lb 3.2 oz (120.7 kg)   SpO2 97%   BMI 35.12 kg/m  BMI: Body mass index is 35.12 kg/m. Well nourished, well developed, in no acute distress  HEENT: normocephalic, atraumatic  Neck: no JVD, carotid bruits or masses Cardiac:  RRR; no significant murmurs, no rubs, or gallops Lungs: CTA b/l, no wheezing, rhonchi or rales  Abd: soft, nontender MS: no deformity or atrophy Ext: no edema  Skin: warm and dry, no rash Neuro:  No gross deficits appreciated Psych: euthymic mood, full affect  PPM site is stable, no tethering or discomfort   EKG:  Done today and reviewed by myself SR, no pre-excitation, T changes appear unchanged from priors PPM interrogation done today and reviewed by myself: Battery and lead measurements are good, AMS/HVR  episodes are SVT, 1:1, longest 1 minute, presenting is AS/VS, V paces 22%  08/08/13: TTE Study Conclusions Left ventricle: Systolic function was normal. The estimated ejection fraction was in the range of 60% to 65%. Wall motion was normal; there were no regional wall motion abnormalities.  Recent Labs: No results found for requested labs within last 8760 hours.  No results found for requested labs within last 8760 hours.   CrCl cannot be calculated (Patient's most recent lab result is older than the maximum 21 days allowed.).   Wt Readings from Last 3 Encounters:  12/28/17 266 lb 3.2 oz (120.7 kg)  12/28/16 266 lb 12.8 oz (121 kg)  10/19/16 269 lb (122 kg)     Other studies reviewed: Additional studies/records reviewed today include: summarized above  ASSESSMENT AND PLAN:  1. PPM     Intact function, no programming changes made  2. HTN     high today, not taking any meds     Resume his verapamil and HCTZ, BMET today  3. SVT     Not new for him, make him feel a little lightheaded     off his verapamil     Resume at 120mg  daily   Disposition: F/u with me in 3 months, sooner if needed.   Current medicines are reviewed at length with the patient today.  The patient did not have any concerns regarding medicines.  Venetia Night, PA-C 12/28/2017 11:08 AM     [facility] [place] [address] [phone] (office)  [phone] (fax)

## 2017-12-28 ENCOUNTER — Ambulatory Visit (INDEPENDENT_AMBULATORY_CARE_PROVIDER_SITE_OTHER): Payer: Self-pay | Admitting: Physician Assistant

## 2017-12-28 ENCOUNTER — Encounter: Payer: Self-pay | Admitting: Physician Assistant

## 2017-12-28 VITALS — BP 158/84 | HR 64 | Ht 73.0 in | Wt 266.2 lb

## 2017-12-28 DIAGNOSIS — I471 Supraventricular tachycardia: Secondary | ICD-10-CM

## 2017-12-28 DIAGNOSIS — I10 Essential (primary) hypertension: Secondary | ICD-10-CM

## 2017-12-28 DIAGNOSIS — Z79899 Other long term (current) drug therapy: Secondary | ICD-10-CM

## 2017-12-28 DIAGNOSIS — Z950 Presence of cardiac pacemaker: Secondary | ICD-10-CM

## 2017-12-28 LAB — BASIC METABOLIC PANEL
BUN/Creatinine Ratio: 10 (ref 9–20)
BUN: 10 mg/dL (ref 6–24)
CO2: 19 mmol/L — ABNORMAL LOW (ref 20–29)
Calcium: 9.5 mg/dL (ref 8.7–10.2)
Chloride: 106 mmol/L (ref 96–106)
Creatinine, Ser: 1.04 mg/dL (ref 0.76–1.27)
GFR calc Af Amer: 92 mL/min/{1.73_m2} (ref >59)
GFR calc non Af Amer: 80 mL/min/{1.73_m2} (ref >59)
Glucose: 96 mg/dL (ref 65–99)
Potassium: 4.5 mmol/L (ref 3.5–5.2)
Sodium: 139 mmol/L (ref 134–144)

## 2017-12-28 MED ORDER — HYDROCHLOROTHIAZIDE 25 MG PO TABS: 25.0000 mg | ORAL_TABLET | Freq: Every day | ORAL | 3 refills | Status: DC

## 2017-12-28 MED ORDER — VERAPAMIL HCL ER 120 MG PO TBCR: 120.0000 mg | EXTENDED_RELEASE_TABLET | Freq: Every day | ORAL | 3 refills | Status: DC

## 2017-12-28 NOTE — Patient Instructions (Addendum)
Medication Instructions:   Your physician recommends that you continue on your current medications as directed. Please refer to the Current Medication list given to you today.   If you need a refill on your cardiac medications before your next appointment, please call your pharmacy.  Labwork:  NONE ORDERED  TODAY    Testing/Procedures: NONE ORDERED  TODAY    Follow-Up:  IN 3 MONTHS WITH URSUY    Any Other Special Instructions Will Be Listed Below (If Applicable).

## 2017-12-29 NOTE — Addendum Note (Signed)
Addended by: Claude Manges on: 12/29/2017 01:32 PM   Modules accepted: Orders

## 2018-03-21 NOTE — Progress Notes (Signed)
Cardiology Office Note Date:  03/21/2018  Patient ID:  [name] Fields, DOB [birth year], MRN [id number] PCP:  Patient, No Pcp Per  Cardiologist:  Dr. Radford Pax Electrophysiologist; Dr. Caryl Comes    Chief Complaint:  f/u EP visit  History of Present Illness: [name] Fields is a 57 y.o. male with history of high degree AVBlock w/PPM, hx of WPW ablated, OSA w/CPAP, HTN, hx of NICM with recovered LVEF.  He comes today to be seen for Dr. Caryl Comes, last seen by him in Dec 2017, at that time had some edema, BP was elevated HCTZ was added, no symptoms.  I saw him in Feb 2019, he was doing well.  Mentioned he had continued to have brief episodes of feeling his heart race, lasting 30 seconds to a minute, never longer then that, makig him feel hot, a little lightheaded.  Never any near syncope or syncope.  No CP or SOB, denies any DOE, no nighttime symptoms, no PND or orthopnea, reports compliance with his CPAP.  He was not taking Verapamil, wasn't sure for how long, thought it had been stopped, but not sure why.  He was out of his HCTZ for a couple months without refills.  He is feeling well.  He denies any CP, palpitations or SOB, no near syncope or syncope, denies any exertional intolerances.  He once a day or so ago had about an houtr of a "nervous or jittery" feeling.  He did not take his medicine today yet , but reports compliance, taking every day.    Device information: SJM dual chamber PPM, initial implant 1999, gen change 2011   Past Medical History:  Diagnosis Date  . Allergy to ACE inhibitors   . Atrial tachycardia ([place])   . Cardiomyopathy, nonischemic ([place])   . Hiatal hernia   . High-grade second-degree heart block     Qualifier: Diagnosis of  By: Caryl Comes, MD, Remus Blake   . Hypertension   . Permanent pacemaker-St. Jude     DOI 1999; gen re in the summer he has o pain syndrome BP 135% at time long so is his we have been short of his device is in a in a in a Foley disease since the  stents removed about 3.0-4 seconds on the episode the patient is very 81 and a [place] when his pacemaker but when he and his placement 2011  . WPW (Wolff-Parkinson-White syndrome)    s/pRFCA    Past Surgical History:  Procedure Laterality Date  . CARDIAC DEFIBRILLATOR PLACEMENT  2011   St Jude -- Accent RF  . PACEMAKER INSERTION  1999   explanted 2011    Current Outpatient Medications  Medication Sig Dispense Refill  . hydrochlorothiazide (HYDRODIURIL) 25 MG tablet Take 1 tablet (25 mg total) by mouth daily. 30 tablet 3  . Multiple Vitamins-Minerals (MULTIVITAMIN WITH MINERALS) tablet Take 1 tablet by mouth daily.    . Omega-3 Fatty Acids (FISH OIL) 1000 MG CAPS Take 1,000 mg by mouth daily.    . verapamil (CALAN-SR) 120 MG CR tablet Take 1 tablet (120 mg total) by mouth at bedtime. 30 tablet 3   No current facility-administered medications for this visit.     Allergies:   Ace inhibitors   Social History:  The patient  reports that he has been smoking cigarettes.  He has a 5.00 pack-year smoking history. He has never used smokeless tobacco. He reports that he drinks alcohol. He reports that he does not use drugs.  Family History:  The patient's family history includes Cancer in his mother; Heart Problems in his mother; Heart disease in his mother; Hypertension in his father.  ROS:  Please see the history of present illness.  All other systems are reviewed and otherwise negative.   PHYSICAL EXAM:  VS:  There were no vitals taken for this visit. BMI: There is no height or weight on file to calculate BMI. Well nourished, well developed, in no acute distress  HEENT: normocephalic, atraumatic  Neck: no JVD, carotid bruits or masses Cardiac:  RRR; no significant murmurs, no rubs, or gallops Lungs:  CTA b/l, no wheezing, rhonchi or rales  Abd: soft, nontender MS: no deformity or  atrophy Ext: no edema  Skin: warm and dry, no rash Neuro:  No gross deficits appreciated Psych:  euthymic mood, full affect  PPM site is stable, no tethering or discomfort   EKG:  2/28/19SR, no pre-excitation, T changes appear unchanged from priors PPM interrogation done today and reviewed by myself: battery and lead testing are OK, AMS/HVR episodes are 1:1 AT/ST., longest 11 minutes.  Most recent was today, he denies any noted symptoms, at the time of the fast rates, he would have just woke, and reports woke startled thinking he had overslept  08/08/13: TTE Study Conclusions Left ventricle: Systolic function was normal. The estimated ejection fraction was in the range of 60% to 65%. Wall motion was normal; there were no regional wall motion abnormalities.  Recent Labs: 12/28/2017: BUN 10; Creatinine, Ser 1.04; Potassium 4.5; Sodium 139  No results found for requested labs within last 8760 hours.   CrCl cannot be calculated (Patient's most recent lab result is older than the maximum 21 days allowed.).   Wt Readings from Last 3 Encounters:  12/28/17 266 lb 3.2 oz (120.7 kg)  12/28/16 266 lb 12.8 oz (121 kg)  10/19/16 269 lb (122 kg)     Other studies reviewed: Additional studies/records reviewed today include: summarized above  ASSESSMENT AND PLAN:  1. PPM     Intact function, no programming changes made  2. HTN     Looks OK, no meds this AM     BMET today given resumed HCTZ last visit  3. SVT     Not new for him, make him feel a little lightheaded     Infrequent, short, no symptoms   Disposition: resume Q 3 month remotes, he thinks he was probably on the road for last that looks was missed, and in-clinic in 1 year, sooner if needed.   Current medicines are reviewed at length with the patient today.  The patient did not have any concerns regarding medicines.  Venetia Night, PA-C 03/21/2018 7:23 PM     [facility] [place] [address] [phone] (office)  [phone] (fax)

## 2018-03-22 ENCOUNTER — Encounter: Payer: Self-pay | Admitting: Physician Assistant

## 2018-03-22 ENCOUNTER — Ambulatory Visit (INDEPENDENT_AMBULATORY_CARE_PROVIDER_SITE_OTHER): Payer: Self-pay | Admitting: Physician Assistant

## 2018-03-22 ENCOUNTER — Encounter (INDEPENDENT_AMBULATORY_CARE_PROVIDER_SITE_OTHER): Payer: Self-pay

## 2018-03-22 VITALS — BP 136/84 | HR 88 | Ht 73.0 in | Wt 265.6 lb

## 2018-03-22 DIAGNOSIS — I471 Supraventricular tachycardia, unspecified: Secondary | ICD-10-CM

## 2018-03-22 DIAGNOSIS — I10 Essential (primary) hypertension: Secondary | ICD-10-CM

## 2018-03-22 DIAGNOSIS — Z950 Presence of cardiac pacemaker: Secondary | ICD-10-CM

## 2018-03-22 LAB — BASIC METABOLIC PANEL
BUN/Creatinine Ratio: 13 (ref 9–20)
BUN: 16 mg/dL (ref 6–24)
CO2: 19 mmol/L — ABNORMAL LOW (ref 20–29)
Calcium: 9.5 mg/dL (ref 8.7–10.2)
Chloride: 103 mmol/L (ref 96–106)
Creatinine, Ser: 1.23 mg/dL (ref 0.76–1.27)
GFR calc Af Amer: 75 mL/min/{1.73_m2} (ref >59)
GFR calc non Af Amer: 65 mL/min/{1.73_m2} (ref >59)
Glucose: 104 mg/dL — ABNORMAL HIGH (ref 65–99)
Potassium: 4.3 mmol/L (ref 3.5–5.2)
Sodium: 135 mmol/L (ref 134–144)

## 2018-03-22 MED ORDER — HYDROCHLOROTHIAZIDE 25 MG PO TABS: 25.0000 mg | ORAL_TABLET | Freq: Every day | ORAL | 4 refills | Status: DC

## 2018-03-22 MED ORDER — VERAPAMIL HCL ER 120 MG PO TBCR: 120.0000 mg | EXTENDED_RELEASE_TABLET | Freq: Every day | ORAL | 4 refills | Status: DC

## 2018-03-22 NOTE — Patient Instructions (Addendum)
Medication Instructions:   Your physician recommends that you continue on your current medications as directed. Please refer to the Current Medication list given to you today.    If you need a refill on your cardiac medications before your next appointment, please call your pharmacy.  Labwork:  BMET TODAY    Testing/Procedures: NONE ORDERED  TODAY    Follow-Up:  Your physician wants you to follow-up in: [place] will receive a reminder letter in the mail two months in advance. If you don't receive a letter, please call our office to schedule the follow-up appointment.   Remote monitoring is used to monitor your Pacemaker of ICD from home. This monitoring reduces the number of office visits required to check your device to one time per year. It allows [place] to keep an eye on the functioning of your device to ensure it is working properly. You are scheduled for a device check from home on . 06-21-18 You may send your transmission at any time that day. If you have a wireless device, the transmission will be sent automatically. After your physician reviews your transmission, you will receive a postcard with your next transmission date.     Any Other Special Instructions Will Be Listed Below (If Applicable).

## 2018-06-21 ENCOUNTER — Encounter: Payer: Self-pay | Admitting: *Deleted

## 2018-06-22 ENCOUNTER — Telehealth: Payer: Self-pay

## 2018-06-22 ENCOUNTER — Encounter: Payer: Self-pay | Admitting: Cardiology

## 2018-06-22 NOTE — Telephone Encounter (Signed)
Spoke with pt and reminded pt of remote transmission that is due today. Pt verbalized understanding.   

## 2018-06-25 ENCOUNTER — Ambulatory Visit (INDEPENDENT_AMBULATORY_CARE_PROVIDER_SITE_OTHER): Payer: Self-pay | Admitting: *Deleted

## 2018-06-25 DIAGNOSIS — I471 Supraventricular tachycardia, unspecified: Secondary | ICD-10-CM

## 2018-06-25 DIAGNOSIS — I441 Atrioventricular block, second degree: Secondary | ICD-10-CM

## 2018-06-25 NOTE — Progress Notes (Signed)
Remote pacemaker transmission.   

## 2018-06-26 ENCOUNTER — Encounter: Payer: Self-pay | Admitting: Cardiology

## 2018-07-25 LAB — CUP PACEART REMOTE DEVICE CHECK
Date Time Interrogation Session: 20190925164542
Implantable Lead Implant Date: 19990719
Implantable Lead Implant Date: 19990719
Implantable Lead Location: 753859
Implantable Lead Location: 753860
Implantable Pulse Generator Implant Date: 20110711
Pulse Gen Model: 2210
Pulse Gen Serial Number: 7152165

## 2018-09-24 ENCOUNTER — Telehealth: Payer: Self-pay | Admitting: Cardiology

## 2018-09-24 NOTE — Telephone Encounter (Signed)
Spoke with pt and reminded pt of remote transmission that is due today. Pt verbalized understanding.   

## 2018-09-25 ENCOUNTER — Encounter: Payer: Self-pay | Admitting: Cardiology

## 2019-01-03 ENCOUNTER — Other Ambulatory Visit: Payer: Self-pay | Admitting: Orthopaedic Surgery

## 2019-01-04 ENCOUNTER — Telehealth: Payer: Self-pay

## 2019-01-04 ENCOUNTER — Other Ambulatory Visit: Payer: Self-pay | Admitting: Orthopaedic Surgery

## 2019-01-04 DIAGNOSIS — M4322 Fusion of spine, cervical region: Secondary | ICD-10-CM

## 2019-01-04 NOTE — Telephone Encounter (Signed)
Patient returned our call to get screened for a myelogram.  He was informed he does not need to hold any medications and that he will be here 2-2.5 hours.  He also was told he needs a driver and will need to be on bedrest for 24 hours after the procedure.  Gypsy Lore, RN

## 2019-01-15 ENCOUNTER — Other Ambulatory Visit: Payer: Self-pay | Admitting: Orthopaedic Surgery

## 2019-01-16 ENCOUNTER — Inpatient Hospital Stay: Admission: RE | Admit: 2019-01-16 | Payer: Self-pay | Source: Ambulatory Visit

## 2019-01-16 ENCOUNTER — Other Ambulatory Visit: Payer: Self-pay

## 2019-02-19 ENCOUNTER — Inpatient Hospital Stay: Admission: RE | Admit: 2019-02-19 | Payer: Self-pay | Source: Ambulatory Visit

## 2019-03-14 ENCOUNTER — Telehealth: Payer: Self-pay | Admitting: Cardiology

## 2019-03-14 NOTE — Telephone Encounter (Signed)
Spoke w/ pt about lost to follow up. He stated that he is in [place]. He plans to be home early next week and will send transmission. I informed pt that he is due to see SK in 03/2019 and will have a scheduler call him to schedule once the office is back up and running. Pt verbalized understanding.

## 2019-03-26 ENCOUNTER — Other Ambulatory Visit: Payer: Self-pay

## 2019-03-26 ENCOUNTER — Ambulatory Visit
Admission: RE | Admit: 2019-03-26 | Discharge: 2019-03-26 | Disposition: A | Discharge status: Home / Self Care | Payer: 59 | Source: Ambulatory Visit | Attending: Orthopaedic Surgery | Admitting: Orthopaedic Surgery

## 2019-03-26 DIAGNOSIS — M4322 Fusion of spine, cervical region: Secondary | ICD-10-CM

## 2019-03-26 MED ORDER — IOPAMIDOL (ISOVUE-M 300) INJECTION 61%
10.0000 mL | Freq: Once | INTRAMUSCULAR | Status: AC | PRN
  Administered 2019-03-26: 11:00:00 10 mL via INTRATHECAL

## 2019-03-26 MED ORDER — DIAZEPAM 5 MG PO TABS
10.0000 mg | ORAL_TABLET | Freq: Once | ORAL | Status: AC
  Administered 2019-03-26: 10 mg via ORAL

## 2019-03-26 NOTE — Discharge Instructions (Signed)

## 2019-05-10 ENCOUNTER — Encounter: Payer: Self-pay | Admitting: Internal Medicine

## 2019-05-10 ENCOUNTER — Telehealth: Payer: Self-pay | Admitting: Internal Medicine

## 2019-05-10 NOTE — Telephone Encounter (Signed)
I spoke with [name] Fields.  His wife tested negative for covid.  [name] Fields has answered no to all other screening questions. He has mask and is aware to wear to appointment. He is aware to arrive 15 minutes prior to appointment and that no one can accompany him to appointment.

## 2019-05-10 NOTE — Telephone Encounter (Signed)
New Message         COVID-19 Pre-Screening Questions:   In the past 7 to 10 days have you had a cough,  shortness of breath, headache, congestion, fever (100 or greater) body aches, chills, sore throat, or sudden loss of taste or sense of smell?  NO  Have you been around anyone with known Covid 19. NO  Have you been around anyone who is awaiting Covid 19 test results in the past 7 to 10 days? Pts wife was given results today, and was NEG  Have you been around anyone who has been exposed to Covid 19, or has mentioned symptoms of Covid 19 within the past 7 to 10 days? NO   If you have any concerns/questions about symptoms patients report during screening (either on the phone or at threshold). Contact the provider seeing the patient or DOD for further guidance.  If neither are available contact a member of the leadership team.

## 2019-05-13 ENCOUNTER — Encounter: Payer: Self-pay | Admitting: Internal Medicine

## 2019-05-13 ENCOUNTER — Other Ambulatory Visit: Payer: Self-pay

## 2019-05-13 ENCOUNTER — Ambulatory Visit (INDEPENDENT_AMBULATORY_CARE_PROVIDER_SITE_OTHER): Payer: BC Managed Care – PPO | Admitting: Internal Medicine

## 2019-05-13 VITALS — BP 118/78 | HR 75 | Ht 73.0 in | Wt 255.6 lb

## 2019-05-13 DIAGNOSIS — I471 Supraventricular tachycardia: Secondary | ICD-10-CM

## 2019-05-13 DIAGNOSIS — Z950 Presence of cardiac pacemaker: Secondary | ICD-10-CM

## 2019-05-13 DIAGNOSIS — I441 Atrioventricular block, second degree: Secondary | ICD-10-CM

## 2019-05-13 LAB — CUP PACEART INCLINIC DEVICE CHECK
Battery Remaining Longevity: 32 mo
Battery Voltage: 2.8 V
Brady Statistic RA Percent Paced: 0.65 %
Brady Statistic RV Percent Paced: 19 %
Date Time Interrogation Session: 20200713170435
Implantable Lead Implant Date: 19990719
Implantable Lead Implant Date: 19990719
Implantable Lead Location: 753859
Implantable Lead Location: 753860
Implantable Pulse Generator Implant Date: 20110711
Lead Channel Impedance Value: 337.5 Ohm
Lead Channel Impedance Value: 362.5 Ohm
Lead Channel Pacing Threshold Amplitude: 0.5 V
Lead Channel Pacing Threshold Amplitude: 0.5 V
Lead Channel Pacing Threshold Amplitude: 1 V
Lead Channel Pacing Threshold Amplitude: 1 V
Lead Channel Pacing Threshold Pulse Width: 0.5 ms
Lead Channel Pacing Threshold Pulse Width: 0.5 ms
Lead Channel Pacing Threshold Pulse Width: 0.7 ms
Lead Channel Pacing Threshold Pulse Width: 0.7 ms
Lead Channel Sensing Intrinsic Amplitude: 4.7 mV
Lead Channel Sensing Intrinsic Amplitude: 9.6 mV
Lead Channel Setting Pacing Amplitude: 2 V
Lead Channel Setting Pacing Amplitude: 2.5 V
Lead Channel Setting Pacing Pulse Width: 0.5 ms
Lead Channel Setting Sensing Sensitivity: 2 mV
Pulse Gen Model: 2210
Pulse Gen Serial Number: 7152165

## 2019-05-13 NOTE — Patient Instructions (Signed)

## 2019-05-13 NOTE — Progress Notes (Signed)
      Patient Care Team: Patient, No Pcp Per as PCP - General (General Practice) [name], MD as Attending Physician (Cardiology)   HPI  [name] Fields is a 58 y.o. male Seen in followup for pacemaker implanted 1999 for high-grade heart block for which he underwent generator replacement in July 2011.  Vp 19 %    He is have mild DOE; echo 10/14>.Normal LV function  The patient denies chest pain, shortness of breath, nocturnal dyspnea, orthopnea or peripheral edema.  There have been no palpitations, lightheadedness or syncope.    He got married nov 2019  7/16  Cr 1.04  K  3.9    Past Medical History:  Diagnosis Date  . Allergy to ACE inhibitors   . Atrial tachycardia ([place])   . Cardiomyopathy, nonischemic ([place])   . Hiatal hernia   . High-grade second-degree heart block     Qualifier: Diagnosis of  By: Caryl Comes, MD, Remus Blake   . Hypertension   . Permanent pacemaker-St. Jude     DOI 1999; gen re in the summer he has o pain syndrome BP 135% at time long so is his we have been short of his device is in a in a in a Foley disease since the stents removed about 3.0-4 seconds on the episode the patient is very 29 and a [place] when his pacemaker but when he and his placement 2011  . WPW (Wolff-Parkinson-White syndrome)    s/pRFCA    Past Surgical History:  Procedure Laterality Date  . CARDIAC DEFIBRILLATOR PLACEMENT  2011   St Jude -- [employer]ent RF  . PACEMAKER INSERTION  1999   explanted 2011    Current Outpatient Medications  Medication Sig Dispense Refill  . hydrochlorothiazide (HYDRODIURIL) 25 MG tablet Take 1 tablet (25 mg total) by mouth daily. 90 tablet 4  . meloxicam (MOBIC) 7.5 MG tablet Take 7.5 mg by mouth daily as needed for pain.    . methocarbamol (ROBAXIN) 500 MG tablet Take 500 mg by mouth daily.    . Multiple Vitamins-Minerals (MULTIVITAMIN WITH MINERALS) tablet Take 1 tablet by mouth daily.     No current facility-administered medications for  this visit.     No Known Allergies  Review of Systems negative except from HPI and PMH  Physical Exam BP 118/78   Pulse 75   Ht 6\' 1"  (1.854 m)   Wt 255 lb 9.6 oz (115.9 kg)   SpO2 93%   BMI 33.72 kg/m  Well developed and well nourished in no acute distress HENT normal Neck supple with JVP-flat Clear Device pocket well healed; without hematoma or erythema.  There is no tethering  Regular rate and rhythm, no murmur Abd-soft with active BS No Clubbing cyanosis edema Skin-warm and dry A & Oriented  Grossly normal sensory and motor function  ECG sinus @ 75 20/10/36     Assessment and  Plan  2AVBlock intermittent  Pacemaker St Jude  Cardiomyopathy-resolved-- LV hypertrophy  Hypertension -poorly controlled   BP well controlled  Device function normal

## 2019-07-17 ENCOUNTER — Telehealth: Payer: Self-pay | Admitting: Physician Assistant

## 2019-07-17 ENCOUNTER — Other Ambulatory Visit: Payer: Self-pay | Admitting: Physician Assistant

## 2019-07-17 DIAGNOSIS — I428 Other cardiomyopathies: Secondary | ICD-10-CM

## 2019-07-17 DIAGNOSIS — I10 Essential (primary) hypertension: Secondary | ICD-10-CM

## 2019-07-17 NOTE — Telephone Encounter (Signed)
[place] and run out of his [place]. Rx called in at requested pharmacy. He will come for BMET check (he is due).

## 2019-08-27 ENCOUNTER — Other Ambulatory Visit: Payer: Self-pay | Admitting: Rehabilitation

## 2019-08-27 ENCOUNTER — Telehealth: Payer: Self-pay

## 2019-08-27 DIAGNOSIS — M4322 Fusion of spine, cervical region: Secondary | ICD-10-CM

## 2019-08-27 NOTE — Telephone Encounter (Signed)
Spoke with patient to review his medications and drug allergies before being scheduled for a myelogram.  He was informed he does not need to hold any medications, will need a driver, will be here about two hours and will need to be on strict bedrest for 24 hours after the procedure.

## 2019-09-03 ENCOUNTER — Ambulatory Visit
Admission: RE | Admit: 2019-09-03 | Discharge: 2019-09-03 | Disposition: A | Discharge status: Home / Self Care | Payer: BC Managed Care – PPO | Source: Ambulatory Visit | Attending: Rehabilitation | Admitting: Rehabilitation

## 2019-09-03 ENCOUNTER — Other Ambulatory Visit: Payer: Self-pay

## 2019-09-03 DIAGNOSIS — M4322 Fusion of spine, cervical region: Secondary | ICD-10-CM

## 2019-09-03 MED ORDER — DIAZEPAM 5 MG PO TABS
10.0000 mg | ORAL_TABLET | Freq: Once | ORAL | Status: AC
  Administered 2019-09-03: 10 mg via ORAL

## 2019-09-03 MED ORDER — IOPAMIDOL (ISOVUE-M 300) INJECTION 61%: 10.0000 mL | Freq: Once | INTRAMUSCULAR | Status: DC | PRN

## 2019-09-03 NOTE — Discharge Instructions (Signed)

## 2019-10-07 ENCOUNTER — Other Ambulatory Visit: Payer: Self-pay

## 2019-10-07 MED ORDER — HYDROCHLOROTHIAZIDE 25 MG PO TABS: 25.0000 mg | ORAL_TABLET | Freq: Every day | ORAL | 2 refills | Status: DC

## 2019-12-05 ENCOUNTER — Ambulatory Visit (INDEPENDENT_AMBULATORY_CARE_PROVIDER_SITE_OTHER): Payer: BC Managed Care – PPO | Admitting: Diagnostic Neuroimaging

## 2019-12-05 ENCOUNTER — Other Ambulatory Visit: Payer: Self-pay

## 2019-12-05 ENCOUNTER — Encounter: Payer: BC Managed Care – PPO | Admitting: Diagnostic Neuroimaging

## 2019-12-05 DIAGNOSIS — Z0289 Encounter for other administrative examinations: Secondary | ICD-10-CM

## 2019-12-05 DIAGNOSIS — R200 Anesthesia of skin: Secondary | ICD-10-CM | POA: Diagnosis not present

## 2019-12-05 NOTE — Procedures (Signed)
[facility]  NCS (NERVE CONDUCTION STUDY) WITH EMG (ELECTROMYOGRAPHY) REPORT   STUDY DATE: 12/05/19 PATIENT NAME: [name] Fields DOB: [birth year] MRN: [id number]  ORDERING CLINICIAN: Minta Balsam, MD  TECHNOLOGIST: Sherre Scarlet ELECTROMYOGRAPHER: Earlean Polka. Keishawn Rajewski, MD  CLINICAL INFORMATION: 59 year old male with bilateral hand numbness.  FINDINGS: NERVE CONDUCTION STUDY:  Left median motor response cannot be obtained.  Right median motor response has prolonged distal latency, decreased amplitude, slow conduction velocity.  Bilateral ulnar motor responses and F-wave latencies are normal.  Bilateral median sensory responses cannot be obtained.  Bilateral ulnar sensory responses are normal.    NEEDLE ELECTROMYOGRAPHY:  Needle examination of right upper extremity is normal.    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   INTERPRETING PHYSICIAN:  Penni Bombard, MD Certified in Neurology, Neurophysiology and Neuroimaging  [facility] [address] [place], [place] [address] [phone]  [facility]    Nerve / Sites Muscle Latency Ref. Amplitude Ref. Rel Amp Segments Distance Velocity Ref. Area    ms ms mV mV %  cm m/s m/s mVms  L Median - APB     Wrist APB NR ?4.4 NR ?4.0 NR Wrist - APB 7   NR     Upper arm APB NR  NR  NR Upper arm - Wrist 25 NR ?49 NR  R Median - APB     Wrist APB 7.9 ?4.4 2.1 ?4.0 100 Wrist - APB 7   6.0     Upper arm APB 13.6  1.1  50 Upper arm - Wrist 25 44 ?49 3.3  L Ulnar - ADM     Wrist ADM 2.7 ?3.3 8.9 ?6.0 100 Wrist - ADM 7   27.8     B.Elbow ADM 6.4  7.3  82.6 B.Elbow - Wrist 22 59 ?49 25.0     A.Elbow ADM 8.3  6.6  90 A.Elbow - B.Elbow 10 55 ?49 22.6         A.Elbow - Wrist      R Ulnar - ADM     Wrist ADM 2.6 ?3.3 10.1 ?6.0 100 Wrist - ADM 7   29.7     B.Elbow ADM 6.5  8.7  86.1 B.Elbow - Wrist  22 56 ?49 28.0     A.Elbow ADM 8.4  8.4  96.3 A.Elbow - B.Elbow 10 55 ?49 27.6         A.Elbow - Wrist                 SNC    Nerve / Sites Rec. Site Peak Lat Ref.  Amp Ref. Segments Distance    ms ms V V  cm  L Median - Orthodromic (Dig II, Mid palm)     Dig II Wrist NR ?3.4 NR ?10 Dig II - Wrist 13  R Median - Orthodromic (Dig II, Mid palm)     Dig II Wrist NR ?3.4 NR ?10 Dig II - Wrist 13  L Ulnar - Orthodromic, (Dig V, Mid palm)     Dig V Wrist 2.3 ?3.1 7 ?5 Dig V - Wrist 11  R Ulnar - Orthodromic, (Dig V, Mid palm)     Dig V Wrist 2.4 ?3.1 7 ?5 Dig V - Wrist 82             F  Wave    Nerve F Lat Ref.  ms ms  L Ulnar - ADM 30.4 ?32.0  R Ulnar - ADM 31.1 ?32.0         EMG Summary Table    Spontaneous MUAP Recruitment  Muscle IA Fib PSW Fasc Other Amp Dur. Poly Pattern  R. Deltoid Normal None None None _______ Normal Normal Normal Normal  R. Biceps brachii Normal None None None _______ Normal Normal Normal Normal  R. Triceps brachii Normal None None None _______ Normal Normal Normal Normal  R. Flexor carpi radialis Normal None None None _______ Normal Normal Normal Normal  R. First dorsal interosseous Normal None None None _______ Normal Normal Normal Normal

## 2020-01-03 ENCOUNTER — Telehealth: Payer: Self-pay | Admitting: *Deleted

## 2020-01-03 NOTE — Telephone Encounter (Signed)
Virtual Visit Pre-Appointment Phone Call  "(Name), I am calling you today to discuss your upcoming appointment. We are currently trying to limit exposure to the virus that causes COVID-19 by seeing patients at home rather than in the office."  1. "What is the BEST phone number to call the day of the visit?" - include this in appointment notes  2. "Do you have or have access to (through a family member/friend) a smartphone with video capability that we can use for your visit?" a. If yes - list this number in appt notes as "cell" (if different from BEST phone #) and list the appointment type as a VIDEO visit in appointment notes b. If no - list the appointment type as a PHONE visit in appointment notes  Confirm consent - "In the setting of the current Covid19 crisis, you are scheduled for a (phone or video) visit with your provider on (date) at (time).  Just as we do with many in-office visits, in order for you to participate in this visit, we must obtain consent.  If you'd like, I can send this to your mychart (if signed up) or email for you to review.  Otherwise, I can obtain your verbal consent now.  All virtual visits are billed to your insurance company just like a normal visit would be.  By agreeing to a virtual visit, we'd like you to understand that the technology does not allow for your provider to perform an examination, and thus may limit your provider's ability to fully assess your condition. If your provider identifies any concerns that need to be evaluated in person, we will make arrangements to do so.  Finally, though the technology is pretty good, we cannot assure that it will always work on either your or our end, and in the setting of a video visit, we may have to convert it to a phone-only visit.  In either situation, we cannot ensure that we have a secure connection.  Are you willing to proceed?" STAFF: Did the patient verbally acknowledge consent to telehealth visit? Document  YES/NO here: YES 3. Advise patient to be prepared - "Two hours prior to your appointment, go ahead and check your blood pressure, pulse, oxygen saturation, and your weight (if you have the equipment to check those) and write them all down. When your visit starts, your provider will ask you for this information. If you have an Apple Watch or Kardia device, please plan to have heart rate information ready on the day of your appointment. Please have a pen and paper handy nearby the day of the visit as well."  4. Give patient instructions for MyChart download to smartphone OR Doximity/Doxy.me as below if video visit (depending on what platform provider is using)  5. Inform patient they will receive a phone call 15 minutes prior to their appointment time (may be from unknown caller ID) so they should be prepared to answer    TELEPHONE CALL NOTE  [name] Fields has been deemed a candidate for a follow-up tele-health visit to limit community exposure during the Covid-19 pandemic. I spoke with the patient via phone to ensure availability of phone/video source, confirm preferred email & phone number, and discuss instructions and expectations.  I reminded [name] Fields to be prepared with any vital sign and/or heart rhythm information that could potentially be obtained via home monitoring, at the time of his visit. I reminded [name] Fields to expect a phone call prior to his visit.  Marolyn Hammock, [place] 01/03/2020 3:20 PM   INSTRUCTIONS FOR DOWNLOADING THE MYCHART APP TO SMARTPHONE  - The patient must first make sure to have activated MyChart and know their login information - If Apple, go to [employer] and type in MyChart in the search bar and download the app. If Android, ask patient to go to [employer] and type in [place] in the search bar and download the app. The app is free but as with any other app downloads, their phone may require them to verify saved payment information or  Apple/Android password.  - The patient will need to then log into the app with their MyChart username and password, and select  as their healthcare provider to link the account. When it is time for your visit, go to the MyChart app, find appointments, and click Begin Video Visit. Be sure to Select Allow for your device to access the Microphone and Camera for your visit. You will then be connected, and your provider will be with you shortly.  **If they have any issues connecting, or need assistance please contact MyChart service desk [phone])**  **If using a computer, in order to ensure the best quality for their visit they will need to use either of the following Internet Browsers: [employer], or Google Chrome**  IF USING DOXIMITY or DOXY.ME - The patient will receive a link just prior to their visit by text.     FULL LENGTH CONSENT FOR TELE-HEALTH VISIT   I hereby voluntarily request, consent and authorize [facility] and its employed or contracted physicians, physician assistants, nurse practitioners or other licensed health care professionals (the Practitioner), to provide me with telemedicine health care services (the "Services") as deemed necessary by the treating Practitioner. I acknowledge and consent to receive the Services by the Practitioner via telemedicine. I understand that the telemedicine visit will involve communicating with the Practitioner through live audiovisual communication technology and the disclosure of certain medical information by electronic transmission. I acknowledge that I have been given the opportunity to request an in-person assessment or other available alternative prior to the telemedicine visit and am voluntarily participating in the telemedicine visit.  I understand that I have the right to withhold or withdraw my consent to the use of telemedicine in the course of my care at any time, without affecting my right to future care  or treatment, and that the Practitioner or I may terminate the telemedicine visit at any time. I understand that I have the right to inspect all information obtained and/or recorded in the course of the telemedicine visit and may receive copies of available information for a reasonable fee.  I understand that some of the potential risks of receiving the Services via telemedicine include:  Marland Kitchen Delay or interruption in medical evaluation due to technological equipment failure or disruption; . Information transmitted may not be sufficient (e.g. poor resolution of images) to allow for appropriate medical decision making by the Practitioner; and/or  . In rare instances, security protocols could fail, causing a breach of personal health information.  Furthermore, I acknowledge that it is my responsibility to provide information about my medical history, conditions and care that is complete and accurate to the best of my ability. I acknowledge that Practitioner's advice, recommendations, and/or decision may be based on factors not within their control, such as incomplete or inaccurate data provided by me or distortions of diagnostic images or specimens that may result from electronic transmissions. I understand that  the practice of medicine is not an exact science and that Practitioner makes no warranties or guarantees regarding treatment outcomes. I acknowledge that I will receive a copy of this consent concurrently upon execution via email to the email address I last provided but may also request a printed copy by calling the office of [facility].    I understand that my insurance will be billed for this visit.   I have read or had this consent read to me. . I understand the contents of this consent, which adequately explains the benefits and risks of the Services being provided via telemedicine.  . I have been provided ample opportunity to ask questions regarding this consent and the Services and have had  my questions answered to my satisfaction. . I give my informed consent for the services to be provided through the use of telemedicine in my medical care  By participating in this telemedicine visit I agree to the above.

## 2020-02-13 NOTE — Progress Notes (Signed)
Virtual Visit via Video Note   This visit type was conducted due to national recommendations for restrictions regarding the COVID-19 Pandemic (e.g. social distancing) in an effort to limit this patient's exposure and mitigate transmission in our community.  Due to his co-morbid illnesses, this patient is at least at moderate risk for complications without adequate follow up.  This format is felt to be most appropriate for this patient at this time.  All issues noted in this document were discussed and addressed.  A limited physical exam was performed with this format.  Please refer to the patient's chart for his consent to telehealth for [facility].  Evaluation Performed:  Follow-up visit  This visit type was conducted due to national recommendations for restrictions regarding the COVID-19 Pandemic (e.g. social distancing).  This format is felt to be most appropriate for this patient at this time.  All issues noted in this document were discussed and addressed.  No physical exam was performed (except for noted visual exam findings with Video Visits).  Please refer to the patient's chart (MyChart message for video visits and phone note for telephone visits) for the patient's consent to telehealth for [facility].  Date:  02/14/2020   ID:  [name] Fields, DOB [birth year], MRN [id number]  Patient Location:  Home  Provider location:   [facility]  PCP:  Patient, No Pcp Per  Cardiologist:  Dr. Caryl Comes Sleep Medicine:  Fransico Him, MD Electrophysiologist:  None   Chief Complaint:  OSA  History of Present Illness:    [name] Fields is a 59 y.o. male who presents via audio/video conferencing for a telehealth visit today.    [name] Fields is a 59 y.o. male with a history of OSA on CPAP.  He had a sleep study done in 2014 showing mild to moderate sleep apnea with an AHI of 18/hr with oxygen desaturations as low as 77%.  He has been on 8cm H2O.  He was lost to followup and is now here to  reestablish care for his sleep apnea.   Unfortunately he lost his PAP device last summer.  He was on a train and accidentally left it there.  He feels he needs to get back on CPAP.  His wife says that he is snoring and sometimes appears not to breath in his sleep.  He occasionally feels sleepy but not that much.    The patient does not have symptoms concerning for COVID-19 infection (fever, chills, cough, or new shortness of breath).   Prior CV studies:   The following studies were reviewed today:  PAP compliance download  Past Medical History:  Diagnosis Date  . Allergy to ACE inhibitors   . Atrial tachycardia ([place])   . Cardiomyopathy, nonischemic ([place])   . Hiatal hernia   . High-grade second-degree heart block     Qualifier: Diagnosis of  By: Caryl Comes, MD, Remus Blake   . Hypertension   . Permanent pacemaker-St. Jude     DOI 1999; gen re in the summer he has o pain syndrome BP 135% at time long so is his we have been short of his device is in a in a in a Foley disease since the stents removed about 3.0-4 seconds on the episode the patient is very 11 and a [place] when his pacemaker but when he and his placement 2011  . WPW (Wolff-Parkinson-White syndrome)    s/pRFCA   Past Surgical History:  Procedure Laterality Date  . CARDIAC DEFIBRILLATOR PLACEMENT  2011   St Jude -- [employer] RF  . PACEMAKER INSERTION  1999   explanted 2011     Current Meds  Medication Sig  . Ascorbic Acid (VITAMIN C) 1000 MG tablet Take 1,000 mg by mouth daily.  Marland Kitchen gabapentin (NEURONTIN) 600 MG tablet   . hydrochlorothiazide (HYDRODIURIL) 25 MG tablet Take 1 tablet (25 mg total) by mouth daily.  . Multiple Vitamins-Minerals (MULTIVITAMIN WITH MINERALS) tablet Take 1 tablet by mouth daily.     Allergies:   Patient has no known allergies.   Social History   Tobacco Use  . Smoking status: Current Every Day Smoker    Packs/day: 0.50    Years: 10.00    Pack years: 5.00    Types: Cigarettes  .  Smokeless tobacco: Never Used  . Tobacco comment: smoking x 10+years.  Substance Use Topics  . Alcohol use: Yes    Comment: social  . Drug use: No     Family Hx: The patient's family history includes Cancer in his mother; Heart Problems in his mother; Heart disease in his mother; Hypertension in his father.  ROS:   Please see the history of present illness.     All other systems reviewed and are negative.   Labs/Other Tests and Data Reviewed:    Recent Labs: No results found for requested labs within last 8760 hours.   Recent Lipid Panel No results found for: CHOL, TRIG, HDL, CHOLHDL, LDLCALC, LDLDIRECT  Wt Readings from Last 3 Encounters:  02/14/20 257 lb (116.6 kg)  05/13/19 255 lb 9.6 oz (115.9 kg)  03/22/18 265 lb 9.6 oz (120.5 kg)     Objective:    Vital Signs:  BP 134/74   Ht 6\' 1"  (1.854 m)   Wt 257 lb (116.6 kg)   BMI 33.91 kg/m    CONSTITUTIONAL:  Well nourished, well developed male in no acute distress.  EYES: anicteric MOUTH: oral mucosa is pink RESPIRATORY: Normal respiratory effort, symmetric expansion CARDIOVASCULAR: No peripheral edema SKIN: No rash, lesions or ulcers MUSCULOSKELETAL: no digital cyanosis NEURO: Cranial Nerves II-XII grossly intact, moves all extremities PSYCH: Intact judgement and insight.  A&O x 3, Mood/affect appropriate   ASSESSMENT & PLAN:    1.  OSA - The patient had been  tolerating PAP therapy well without any problems but lost his PAP device on a train last summer.  He needs to get a new device.  I will order him a ResMed CPAP on 8cm H2O with heated humidity and mask of choice and he will see me back in 8 weeks per insurance requirements to document compliance.   2.  HTN -BP controlled -Continue HCTZ 25mg  daily  3.  Obesity -I have encouraged him to get into a routine exercise program and cut back on carbs and portions.    COVID-19 Education: The signs and symptoms of COVID-19 were discussed with the patient and  how to seek care for testing (follow up with PCP or arrange E-visit).  The importance of social distancing was discussed today.  Patient Risk:   After full review of this patient's clinical status, I feel that they are at least moderate risk at this time.  Time:   Today, I have spent 20 minutes on telemedicine discussing medical problems including OSA, HTN, obesity and reviewing patient's chart including PAP compliance download.  Medication Adjustments/Labs and Tests Ordered: Current medicines are reviewed at length with the patient today.  Concerns regarding medicines are outlined above.  Tests Ordered: No  orders of the defined types were placed in this encounter.  Medication Changes: No orders of the defined types were placed in this encounter.   Disposition:  Follow up after getting new PAP device  Signed, Fransico Him, MD  02/14/2020 9:22 AM     [facility]

## 2020-02-14 ENCOUNTER — Telehealth: Payer: Self-pay | Admitting: *Deleted

## 2020-02-14 ENCOUNTER — Other Ambulatory Visit: Payer: Self-pay

## 2020-02-14 ENCOUNTER — Encounter: Payer: Self-pay | Admitting: Cardiology

## 2020-02-14 ENCOUNTER — Telehealth (INDEPENDENT_AMBULATORY_CARE_PROVIDER_SITE_OTHER): Payer: BC Managed Care – PPO | Admitting: Cardiology

## 2020-02-14 ENCOUNTER — Other Ambulatory Visit: Payer: Self-pay | Admitting: Orthopedic Surgery

## 2020-02-14 VITALS — BP 134/74 | Ht 73.0 in | Wt 257.0 lb

## 2020-02-14 DIAGNOSIS — I10 Essential (primary) hypertension: Secondary | ICD-10-CM

## 2020-02-14 DIAGNOSIS — E669 Obesity, unspecified: Secondary | ICD-10-CM

## 2020-02-14 DIAGNOSIS — G4733 Obstructive sleep apnea (adult) (pediatric): Secondary | ICD-10-CM | POA: Diagnosis not present

## 2020-02-14 NOTE — Patient Instructions (Signed)
Medication Instructions:  Your physician recommends that you continue on your current medications as directed. Please refer to the Current Medication list given to you today.  *If you need a refill on your cardiac medications before your next appointment, please call your pharmacy*   Follow-Up: At [facility], you and your health needs are our priority.  As part of our continuing mission to provide you with exceptional heart care, we have created designated Provider Care Teams.  These Care Teams include your primary Cardiologist (physician) and Advanced Practice Providers (APPs -  Physician Assistants and Nurse Practitioners) who all work together to provide you with the care you need, when you need it.  We recommend signing up for the patient portal called "MyChart".  Sign up information is provided on this After Visit Summary.  MyChart is used to connect with patients for Virtual Visits (Telemedicine).  Patients are able to view lab/test results, encounter notes, upcoming appointments, etc.  Non-urgent messages can be sent to your provider as well.   To learn more about what you can do with MyChart, go to [email or web address].    Your next appointment:   8 week(s)  The format for your next appointment:   Virtual Visit   Provider:   Fransico Him, MD

## 2020-02-14 NOTE — Telephone Encounter (Addendum)
Order placed to [facility]. Patient notified.

## 2020-02-14 NOTE — Telephone Encounter (Signed)
-----   Message from Antonieta Iba, RN sent at 02/14/2020  9:42 AM EDT ----- From Dr. Radford Pax: Please send to [facility] to order ResMed CPAP on 8cm H2O with heated humidity and mask of choice and he will see me back in 8 weeks.  I have scheduled him for a FU in 8 weeks.  Thanks!

## 2020-02-14 NOTE — Telephone Encounter (Signed)
   [facility] Pre-operative Risk Assessment    Request for surgical clearance:  1. What type of surgery is being performed? RIGHT CARPAL TUNNEL   2. When is this surgery scheduled? 03/10/20   3. What type of clearance is required (medical clearance vs. Pharmacy clearance to hold med vs. Both)? MEDICAL  4. Are there any medications that need to be held prior to surgery and how long? NONE LISTED   5. Practice name and name of physician performing surgery? [facility]; DR. [place]   6. What is your office phone number [phone]    7.   What is your office fax number [phone]  8.   Anesthesia type (None, local, MAC, general) ? IV REGIONAL FOREARM BLOCK   Julaine Hua 02/14/2020, 5:15 PM  _________________________________________________________________   (provider comments below)

## 2020-02-17 NOTE — Telephone Encounter (Signed)
Upon patient request DME selection is [facility]. Patient understands he will be contacted by [facility] to set up his cpap. Patient understands to call if [facility] does not contact him with new setup in a timely manner. Patient understands they will be called once confirmation has been received from [facility] that they have received their new machine to schedule 10 week follow up appointment.  [facility] notified of new cpap order  Please add to airview Patient was grateful for the call and thanked me.

## 2020-02-17 NOTE — Telephone Encounter (Signed)
   Primary Cardiologist: Dr. Caryl Comes  Chart reviewed as part of pre-operative protocol coverage. He was doing well when seen by Dr. Radford Pax 02/14/20.  Given past medical history and time since last visit, based on ACC/AHA guidelines, ALECK WEATHERBY would be at acceptable risk for the planned procedure without further cardiovascular testing.   I will route this recommendation to the requesting party via Epic fax function and remove from pre-op pool.  Please call with questions.  [place], [place] 02/17/2020, 9:44 AM

## 2020-03-09 DIAGNOSIS — M180 Bilateral primary osteoarthritis of first carpometacarpal joints: Secondary | ICD-10-CM | POA: Insufficient documentation

## 2020-03-09 DIAGNOSIS — G5603 Carpal tunnel syndrome, bilateral upper limbs: Secondary | ICD-10-CM | POA: Insufficient documentation

## 2020-03-10 ENCOUNTER — Ambulatory Visit (HOSPITAL_BASED_OUTPATIENT_CLINIC_OR_DEPARTMENT_OTHER): Admit: 2020-03-10 | Payer: BC Managed Care – PPO | Admitting: Orthopedic Surgery

## 2020-03-10 ENCOUNTER — Encounter (HOSPITAL_BASED_OUTPATIENT_CLINIC_OR_DEPARTMENT_OTHER): Payer: Self-pay

## 2020-03-10 SURGERY — CARPAL TUNNEL RELEASE
Anesthesia: IV Sedation (MBSC Only) | Laterality: Right

## 2020-04-15 ENCOUNTER — Telehealth: Payer: Self-pay

## 2020-04-15 ENCOUNTER — Telehealth: Payer: Self-pay | Admitting: *Deleted

## 2020-04-15 DIAGNOSIS — G4733 Obstructive sleep apnea (adult) (pediatric): Secondary | ICD-10-CM

## 2020-04-15 NOTE — Telephone Encounter (Signed)
-----   Message from [name], [place] sent at 04/15/2020  4:32 PM EDT ----- Regarding: FW: Referral to ENT Please send referral. Dx  enlarged tonsils and elongated uvula which could be contributing to his OSA.  ----- Message ----- From: [name], MD Sent: 04/15/2020  12:55 PM EDT To: [name], CMA Subject: RE: Referral to ENT                            Go a head and refer to ENT and then he needs to call me when he has seen them to let me know what they said  Traci ----- Message ----- From: [name], CMA Sent: 04/15/2020   9:27 AM EDT To: [name], MD Subject: Referral to ENT                                12/28/2016  He has enlarged tonsils and elongated uvula which could be contributing to his OSA.  I will refer him to ENT for evaluation.   Patient never got set up with his cpap so I will cancel his appointment for Friday 04/17/20. He feels he is sleeping better without his cpap and ask if he really needs the cpap. He sleeps 5-6 hours and can sleep 8 hours if really tired. He never saw the ENT but he wants to. Do you still want to refer him?  Please advise

## 2020-04-15 NOTE — Telephone Encounter (Signed)
Referral has been placed for ENT

## 2020-04-15 NOTE — Telephone Encounter (Signed)
Message sent to [facility] to send referral.

## 2020-04-15 NOTE — Telephone Encounter (Signed)
-----   Message from [name], MD sent at 04/15/2020 12:55 PM EDT ----- Regarding: RE: Referral to ENT Go a head and refer to ENT and then he needs to call me when he has seen them to let me know what they said  Traci ----- Message ----- From: [name], CMA Sent: 04/15/2020   9:27 AM EDT To: [name], MD Subject: Referral to ENT                                12/28/2016  He has enlarged tonsils and elongated uvula which could be contributing to his OSA.  I will refer him to ENT for evaluation.   Patient never got set up with his cpap so I will cancel his appointment for Friday 04/17/20. He feels he is sleeping better without his cpap and ask if he really needs the cpap. He sleeps 5-6 hours and can sleep 8 hours if really tired. He never saw the ENT but he wants to. Do you still want to refer him?  Please advise

## 2020-04-17 ENCOUNTER — Telehealth: Payer: BC Managed Care – PPO | Admitting: Cardiology

## 2020-05-08 DIAGNOSIS — I517 Cardiomegaly: Secondary | ICD-10-CM | POA: Insufficient documentation

## 2020-05-12 ENCOUNTER — Ambulatory Visit (INDEPENDENT_AMBULATORY_CARE_PROVIDER_SITE_OTHER): Payer: BC Managed Care – PPO | Admitting: Otolaryngology

## 2020-05-12 ENCOUNTER — Encounter (INDEPENDENT_AMBULATORY_CARE_PROVIDER_SITE_OTHER): Payer: Self-pay | Admitting: Otolaryngology

## 2020-05-12 ENCOUNTER — Encounter: Payer: Self-pay | Admitting: Internal Medicine

## 2020-05-12 ENCOUNTER — Other Ambulatory Visit: Payer: Self-pay

## 2020-05-12 ENCOUNTER — Ambulatory Visit (INDEPENDENT_AMBULATORY_CARE_PROVIDER_SITE_OTHER): Payer: BC Managed Care – PPO | Admitting: Internal Medicine

## 2020-05-12 VITALS — Temp 97.7°F

## 2020-05-12 VITALS — BP 132/78 | HR 71 | Ht 73.0 in | Wt 240.0 lb

## 2020-05-12 DIAGNOSIS — Z950 Presence of cardiac pacemaker: Secondary | ICD-10-CM | POA: Diagnosis not present

## 2020-05-12 DIAGNOSIS — I441 Atrioventricular block, second degree: Secondary | ICD-10-CM | POA: Diagnosis not present

## 2020-05-12 DIAGNOSIS — K219 Gastro-esophageal reflux disease without esophagitis: Secondary | ICD-10-CM | POA: Diagnosis not present

## 2020-05-12 DIAGNOSIS — R609 Edema, unspecified: Secondary | ICD-10-CM

## 2020-05-12 DIAGNOSIS — J310 Chronic rhinitis: Secondary | ICD-10-CM

## 2020-05-12 DIAGNOSIS — R0600 Dyspnea, unspecified: Secondary | ICD-10-CM

## 2020-05-12 DIAGNOSIS — R0609 Other forms of dyspnea: Secondary | ICD-10-CM

## 2020-05-12 DIAGNOSIS — I517 Cardiomegaly: Secondary | ICD-10-CM

## 2020-05-12 MED ORDER — FUROSEMIDE 20 MG PO TABS
ORAL_TABLET | ORAL | 0 refills | Status: DC
Start: 2020-05-12 — End: 2020-06-23

## 2020-05-12 MED ORDER — FLUTICASONE PROPIONATE 50 MCG/ACT NA SUSP: 2.0000 | Freq: Every day | NASAL | 0 refills | Status: DC

## 2020-05-12 MED ORDER — HYDROCHLOROTHIAZIDE 25 MG PO TABS: 25.0000 mg | ORAL_TABLET | Freq: Every day | ORAL | 2 refills | Status: DC

## 2020-05-12 MED ORDER — OMEPRAZOLE 40 MG PO CPDR: 40.0000 mg | DELAYED_RELEASE_CAPSULE | Freq: Every day | ORAL | 3 refills | Status: DC

## 2020-05-12 NOTE — Progress Notes (Signed)
HPI: [name] Fields is a 59 y.o. male who presents is referred by Dr. Radford Pax for evaluation of throat and tonsil complaints.  Patient complains fullness and congestion in his throat but points to the area of the larynx and cricoid cartilage in the middle with globus type symptoms.  He has been told that he has large tonsils.  But today he complains more of a cough and feels like something is in his throat that he cannot cough out. He does have history of obstructive sleep apnea and wears a nasal CPAP device.  He works as a Administrator. He apparently had a sleep test that demonstrated moderate obstructive sleep apnea.. Denies any hoarseness or sore throat.  He has no difficulty swallowing. He is working on losing weight.  Past Medical History:  Diagnosis Date  . Allergy to ACE inhibitors   . Atrial tachycardia ([place])   . Cardiomyopathy, nonischemic ([place])   . Hiatal hernia   . High-grade second-degree heart block     Qualifier: Diagnosis of  By: Caryl Comes, MD, Remus Blake   . Hypertension   . Permanent pacemaker-St. Jude     DOI 1999; gen re in the summer he has o pain syndrome BP 135% at time long so is his we have been short of his device is in a in a in a Foley disease since the stents removed about 3.0-4 seconds on the episode the patient is very 70 and a [place] when his pacemaker but when he and his placement 2011  . WPW (Wolff-Parkinson-White syndrome)    s/pRFCA   Past Surgical History:  Procedure Laterality Date  . CARDIAC DEFIBRILLATOR PLACEMENT  2011   St Jude -- Accent RF  . PACEMAKER INSERTION  1999   explanted 2011   Social History   Socioeconomic History  . Marital status: Single    Spouse name: Not on file  . Number of children: Not on file  . Years of education: Not on file  . Highest education level: Not on file  Occupational History  . Occupation: truck Education administrator: [employer]  Tobacco Use  . Smoking status: Current Every Day Smoker     Packs/day: 0.50    Years: 43.00    Pack years: 21.50    Types: Cigarettes    Start date: 62  . Smokeless tobacco: Never Used  . Tobacco comment: Pt has stopped and satated back  Vaping Use  . Vaping Use: Never used  Substance and Sexual Activity  . Alcohol use: Yes    Comment: social  . Drug use: No  . Sexual activity: Not on file  Other Topics Concern  . Not on file  Social History Narrative  . Not on file   Social Determinants of Health   Financial Resource Strain:   . Difficulty of Paying Living Expenses:   Food Insecurity:   . Worried About Charity fundraiser in the Last Year:   . Arboriculturist in the Last Year:   Transportation Needs:   . Film/video editor (Medical):   Marland Kitchen Lack of Transportation (Non-Medical):   Physical Activity:   . Days of Exercise per Week:   . Minutes of Exercise per Session:   Stress:   . Feeling of Stress :   Social Connections:   . Frequency of Communication with Friends and Family:   . Frequency of Social Gatherings with Friends and Family:   . Attends Religious Services:   .  Active Member of Clubs or Organizations:   . Attends Archivist Meetings:   Marland Kitchen Marital Status:    Family History  Problem Relation Age of Onset  . Cancer Mother   . Heart disease Mother   . Heart Problems Mother        pacemaker  . Hypertension Father    No Known Allergies Prior to Admission medications   Medication Sig Start Date End Date Taking? Authorizing Provider  Ascorbic Acid (VITAMIN C) 1000 MG tablet Take 1,000 mg by mouth daily.   Yes [provider]  hydrochlorothiazide (HYDRODIURIL) 25 MG tablet Take 1 tablet (25 mg total) by mouth daily. 05/12/20 08/10/20 Yes [name], MD  Multiple Vitamins-Minerals (MULTIVITAMIN WITH MINERALS) tablet Take 1 tablet by mouth daily.   Yes [provider]  furosemide (LASIX) 20 MG tablet Take 1- (20mg ) tablet x 2 days Patient not taking: Reported on 05/12/2020 05/12/20    [name], MD  meloxicam (MOBIC) 7.5 MG tablet Take by mouth. Patient not taking: Reported on 05/12/2020    [provider]     Positive ROS: Otherwise negative  All other systems have been reviewed and were otherwise negative with the exception of those mentioned in the HPI and as above.  Physical Exam: Constitutional: Alert, well-appearing, no acute distress. Ears: External ears without lesions or tenderness. Ear canals are clear bilaterally with intact, clear TMs.  Nasal: External nose without lesions. Septum with minimal deformity.  Moderate sized turbinates. Clear nasal passages otherwise with no signs of infection and no polyps. Oral: Lips and gums without lesions. Tongue and palate mucosa without lesions. Posterior oropharynx clear.  He has moderate sized tonsils but a very elongated uvula and soft palate. Fiberoptic laryngoscopy was performed through the right nostril.  The nasopharynx was clear.  The base of tongue vallecula and epiglottis were normal.  The vocal cords were clear bilaterally with normal vocal mobility.  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  But no mucosal abnormalities noted. Neck: No palpable adenopathy or masses Respiratory: Breathing comfortably  Skin: No facial/neck lesions or rash noted.  Laryngoscopy  Date/Time: 05/12/2020 5:10 PM Performed by: Rozetta Nunnery, MD Authorized by: Rozetta Nunnery, MD   Consent:    Consent obtained:  Verbal   Consent given by:  Patient Procedure details:    Indications: direct visualization of the upper aerodigestive tract and difficult indirect mirror examination     Medication:  Afrin   Instrument: flexible fiberoptic laryngoscope     Scope location: right nare   Sinus:    Right nasopharynx: normal   Mouth:    Oropharynx: normal     Vallecula: normal     Base of tongue: normal     Epiglottis: normal   Throat:    Pyriform  sinus: normal     False vocal cords: normal     True vocal cords: normal   Comments:     Patient had a clear upper airway examination however he had moderate mucosal swelling with supraglottic swelling and arytenoid mucosal swelling 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 Moderate rhinitis Obstructive sleep apnea  Plan: Prescribed omeprazole 40 mg daily before dinner for the next 3 months. Also prescribed Flonase 2 sprays each nostril at night as this will help some  with nasal congestion at night as well as postnasal drainage. Reviewed with him that he has moderate size tonsils but he does have a elongated uvula.  But I would not recommend any surgical intervention at this time.   Radene Journey, MD   CC:

## 2020-05-12 NOTE — Patient Instructions (Addendum)
Medication Instructions:  Your physician has recommended you make the following change in your medication:   Begin taking Furosemide 20mg  1 tablet by mouth daily x 2 days.   Labwork: CBC and BMET today  Testing/Procedures: Your physician has requested that you have a lexiscan myoview. For further information please visit [email or web address]. Please follow instruction sheet, as given.   Follow-Up: Your physician wants you to follow-up in: 12 months with Dr Caryl Comes. You will receive a reminder letter in the mail two months in advance. If you don't receive a letter, please call our office to schedule the follow-up appointment.  Remote monitoring is used to monitor your Pacemaker of ICD from home. This monitoring reduces the number of office visits required to check your device to one time per year. It allows [place] to keep an eye on the functioning of your device to ensure it is working properly.  Any Other Special Instructions Will Be Listed Below (If Applicable).  If you need a refill on your cardiac medications before your next appointment, please call your pharmacy.

## 2020-05-12 NOTE — Progress Notes (Signed)
Patient Care Team: Patient, No Pcp Per as PCP - General (General Practice) [name], MD as PCP - Sleep Medicine (Cardiology) [name], MD as Attending Physician (Cardiology)   HPI  [name] Fields is a 59 y.o. male Seen in followup for pacemaker implanted 1999 for high-grade heart block for which he underwent generator replacement in July 2011.  Vp 19 % >>Vp 9%   He is have mild DOE; echo 10/14>.Normal LV function  He got married nov 2019  Complaining of shortness of breath with exertion but no chest pain.  Mild edema.  Salt averse.  No nocturnal dyspnea orthopnea    Date Cr K Hgb  7/16 1.04 3.9    5/19 1.26 4.6       Past Medical History:  Diagnosis Date  . Allergy to ACE inhibitors   . Atrial tachycardia ([place])   . Cardiomyopathy, nonischemic ([place])   . Hiatal hernia   . High-grade second-degree heart block     Qualifier: Diagnosis of  By: Caryl Comes, MD, Remus Blake   . Hypertension   . Permanent pacemaker-St. Jude     DOI 1999; gen re in the summer he has o pain syndrome BP 135% at time long so is his we have been short of his device is in a in a in a Foley disease since the stents removed about 3.0-4 seconds on the episode the patient is very 89 and a [place] when his pacemaker but when he and his placement 2011  . WPW (Wolff-Parkinson-White syndrome)    s/pRFCA    Past Surgical History:  Procedure Laterality Date  . CARDIAC DEFIBRILLATOR PLACEMENT  2011   St Jude -- Accent RF  . PACEMAKER INSERTION  1999   explanted 2011    Current Outpatient Medications  Medication Sig Dispense Refill  . Ascorbic Acid (VITAMIN C) 1000 MG tablet Take 1,000 mg by mouth daily.    . hydrochlorothiazide (HYDRODIURIL) 25 MG tablet Take 1 tablet (25 mg total) by mouth daily. 90 tablet 2  . meloxicam (MOBIC) 7.5 MG tablet Take by mouth.    . Multiple Vitamins-Minerals (MULTIVITAMIN WITH MINERALS) tablet Take 1 tablet by mouth daily.     No current  facility-administered medications for this visit.    No Known Allergies  Review of Systems negative except from HPI and PMH  Physical Exam BP 132/78   Pulse 71   Ht 6\' 1"  (1.854 m)   Wt 240 lb (108.9 kg)   SpO2 98%   BMI 31.66 kg/m  Well developed and well nourished in no acute distress HENT normal Neck supple with JVP-8 Clear Device pocket well healed; without hematoma or erythema.  There is no tethering  Regular rate and rhythm, no  murmur Abd-soft with active BS No Clubbing cyanosis tr edema Skin-warm and dry A & Oriented  Grossly normal sensory and motor function  ECG sinus at 71 Intervals 18/12/38 T wave inversions 2, 3, F, V3-V5    Assessment and  Plan  2AVBlock intermittent  Pacemaker St Jude  Cardiomyopathy-resolved-- LV hypertrophy  Hypertension -well-controlled  Dyspnea on exertion  Abnormal ECG   The patient device is functioning normally and is approaching ERI.  Heart rate excursion is adequate.  Ventricular pacing is 9%.  Dyspnea on exertion thus is unlikely related to device associated cardiomyopathy; the T wave inversions are concerning for ischemia.  We will undertake Myoview scanning for both LV function and coronary perfusion with a  low threshold for catheterization.  We will give a prescription for furosemide 20 mg to take for 2 days (dispense 15) to help with his diuresis given that he is volume overloaded modestly

## 2020-05-13 ENCOUNTER — Telehealth: Payer: Self-pay | Admitting: *Deleted

## 2020-05-13 LAB — BASIC METABOLIC PANEL
BUN/Creatinine Ratio: 8 — ABNORMAL LOW (ref 9–20)
BUN: 9 mg/dL (ref 6–24)
CO2: 24 mmol/L (ref 20–29)
Calcium: 9.5 mg/dL (ref 8.7–10.2)
Chloride: 106 mmol/L (ref 96–106)
Creatinine, Ser: 1.12 mg/dL (ref 0.76–1.27)
GFR calc Af Amer: 83 mL/min/{1.73_m2} (ref >59)
GFR calc non Af Amer: 72 mL/min/{1.73_m2} (ref >59)
Glucose: 85 mg/dL (ref 65–99)
Potassium: 4.6 mmol/L (ref 3.5–5.2)
Sodium: 141 mmol/L (ref 134–144)

## 2020-05-13 LAB — CBC
Hematocrit: 41 % (ref 37.5–51.0)
Hemoglobin: 14.2 g/dL (ref 13.0–17.7)
MCH: 30.1 pg (ref 26.6–33.0)
MCHC: 34.6 g/dL (ref 31.5–35.7)
MCV: 87 fL (ref 79–97)
Platelets: 335 10*3/uL (ref 150–450)
RBC: 4.71 x10E6/uL (ref 4.14–5.80)
RDW: 13.5 % (ref 11.6–15.4)
WBC: 8.6 10*3/uL (ref 3.4–10.8)

## 2020-05-13 NOTE — Telephone Encounter (Signed)
Patient is with [facility] and states he has not received his cpap and he was told the mask he uses has been discontinued.  I reached out to [facility] who states the patient has no showed for his appointments twice 6/14/and 6/21. Katie at [facility] says the patient needs to call the office and get set up for a mask fitting to get the right mask for him and get put on the list of patients to get a new cpap since there is a recall on all the Respironics units. Patient has been notified.

## 2020-05-15 ENCOUNTER — Other Ambulatory Visit: Payer: Self-pay | Admitting: Internal Medicine

## 2020-05-15 LAB — CUP PACEART INCLINIC DEVICE CHECK
Battery Remaining Longevity: 13 mo
Battery Voltage: 2.71 V
Brady Statistic RA Percent Paced: 0.78 %
Brady Statistic RV Percent Paced: 9.8 %
Date Time Interrogation Session: 20210713143700
Implantable Lead Implant Date: 19990719
Implantable Lead Implant Date: 19990719
Implantable Lead Location: 753859
Implantable Lead Location: 753860
Implantable Pulse Generator Implant Date: 20110711
Lead Channel Impedance Value: 337.5 Ohm
Lead Channel Impedance Value: 362.5 Ohm
Lead Channel Pacing Threshold Amplitude: 0.5 V
Lead Channel Pacing Threshold Amplitude: 0.5 V
Lead Channel Pacing Threshold Amplitude: 0.75 V
Lead Channel Pacing Threshold Amplitude: 0.75 V
Lead Channel Pacing Threshold Pulse Width: 0.5 ms
Lead Channel Pacing Threshold Pulse Width: 0.5 ms
Lead Channel Pacing Threshold Pulse Width: 0.7 ms
Lead Channel Pacing Threshold Pulse Width: 0.7 ms
Lead Channel Sensing Intrinsic Amplitude: 5 mV
Lead Channel Sensing Intrinsic Amplitude: 9.1 mV
Lead Channel Setting Pacing Amplitude: 2 V
Lead Channel Setting Pacing Amplitude: 2.5 V
Lead Channel Setting Pacing Pulse Width: 0.5 ms
Lead Channel Setting Sensing Sensitivity: 2 mV
Pulse Gen Model: 2210
Pulse Gen Serial Number: 7152165

## 2020-05-15 NOTE — Telephone Encounter (Signed)
Can you please help verify how patient should be taking Lasix.   Per chart : Begin taking Furosemide 20mg  1 tablet by mouth daily x 2 days.  Looks like 15 tablets for this prescription.

## 2020-05-15 NOTE — Telephone Encounter (Signed)
Patients furosemide perscription needs to be clarified and then sent to [facility] in [place] ([place]) as he is transferring medications there   *STAT* If patient is at the pharmacy, call can be transferred to refill team.   1. Which medications need to be refilled? (please list name of each medication and dose if known) furosemide 20 mg   2. Which pharmacy/location (including street and city if local pharmacy) is medication to be sent to? [facility] on [address]  3. Do they need a 30 day or 90 day supply? unclear

## 2020-05-18 ENCOUNTER — Telehealth (HOSPITAL_COMMUNITY): Payer: Self-pay

## 2020-05-18 ENCOUNTER — Encounter (HOSPITAL_COMMUNITY): Payer: Self-pay

## 2020-05-18 NOTE — Telephone Encounter (Signed)
Ruth patient.

## 2020-05-19 NOTE — Telephone Encounter (Signed)
Attempted phone call to pt and left voicemail message to contact RN at [phone]. 

## 2020-05-21 ENCOUNTER — Encounter (HOSPITAL_COMMUNITY): Payer: BC Managed Care – PPO

## 2020-05-21 NOTE — Telephone Encounter (Signed)
Attempted phone call to pt.  OK to leave voicemail message per chart.  Requested pt contact RN at [phone], RN wanting to make sure pt was able to receive Furosemide from pharmacy as prescribed.

## 2020-05-25 ENCOUNTER — Encounter (HOSPITAL_COMMUNITY): Payer: Self-pay | Admitting: *Deleted

## 2020-05-27 ENCOUNTER — Ambulatory Visit (HOSPITAL_COMMUNITY): Payer: BC Managed Care – PPO

## 2020-05-27 ENCOUNTER — Other Ambulatory Visit: Payer: Self-pay

## 2020-05-28 ENCOUNTER — Ambulatory Visit (HOSPITAL_COMMUNITY): Payer: BC Managed Care – PPO | Attending: Internal Medicine

## 2020-05-28 VITALS — Ht 73.0 in | Wt 240.0 lb

## 2020-05-28 DIAGNOSIS — R0600 Dyspnea, unspecified: Secondary | ICD-10-CM

## 2020-05-28 DIAGNOSIS — I517 Cardiomegaly: Secondary | ICD-10-CM | POA: Insufficient documentation

## 2020-05-28 DIAGNOSIS — I440 Atrioventricular block, first degree: Secondary | ICD-10-CM | POA: Diagnosis present

## 2020-05-28 DIAGNOSIS — I471 Supraventricular tachycardia: Secondary | ICD-10-CM | POA: Diagnosis present

## 2020-05-28 LAB — MYOCARDIAL PERFUSION IMAGING
LV dias vol: 119 mL (ref 62–150)
LV sys vol: 57 mL
Peak HR: 90 {beats}/min
Rest HR: 68 {beats}/min
SDS: 2
SRS: 0
SSS: 2
TID: 0.96

## 2020-05-28 MED ORDER — TECHNETIUM TC 99M TETROFOSMIN IV KIT
31.3000 | PACK | Freq: Once | INTRAVENOUS | Status: AC | PRN
  Administered 2020-05-28: 31.3 via INTRAVENOUS
  Filled 2020-05-28: qty 32

## 2020-05-28 MED ORDER — TECHNETIUM TC 99M TETROFOSMIN IV KIT
10.9000 | PACK | Freq: Once | INTRAVENOUS | Status: AC | PRN
  Administered 2020-05-28: 10.9 via INTRAVENOUS
  Filled 2020-05-28: qty 11

## 2020-05-28 MED ORDER — REGADENOSON 0.4 MG/5ML IV SOLN
0.4000 mg | Freq: Once | INTRAVENOUS | Status: AC
  Administered 2020-05-28: 0.4 mg via INTRAVENOUS

## 2020-06-08 NOTE — Telephone Encounter (Signed)
Spoke with pt who continues to complain of ankle edema greater in the left despite Furosemide.  Pt reports no CP or SOB.  He states he is monitoring his Na+ intake and is drinking mostly water with the exception of some gingerale.  He does not weigh himself daily but encouraged daily weight.  Pt advised will forward information to Dr Caryl Comes for review and recommendation.  Reviewed ED precautions.  Pt verbalizes understanding and agrees with current plan.

## 2020-06-10 ENCOUNTER — Telehealth: Payer: Self-pay | Admitting: *Deleted

## 2020-06-10 NOTE — Telephone Encounter (Signed)
Spoke with pt to advise that Merlin monitor is not up to date. Encouraged him to keep it plugged in at his bedside. Pt verbalizes understanding and will ask his wife to plug monitor in at home. He is aware next transmission should be received on 06/12/20 as long as monitor is plugged in near where he sleeps. Pt appreciative of call and denies questions at this time.

## 2020-06-17 NOTE — Telephone Encounter (Signed)
LMOVM (DPR) advising that transmission scheduled for 06/12/20 was not received. Merlin monitor does not appear to be up to date. Direct number and office hours provided for call back.

## 2020-06-18 ENCOUNTER — Ambulatory Visit (INDEPENDENT_AMBULATORY_CARE_PROVIDER_SITE_OTHER): Payer: BC Managed Care – PPO | Admitting: *Deleted

## 2020-06-18 DIAGNOSIS — I441 Atrioventricular block, second degree: Secondary | ICD-10-CM

## 2020-06-18 LAB — CUP PACEART REMOTE DEVICE CHECK
Battery Remaining Longevity: 8 mo
Battery Remaining Percentage: 8 %
Battery Voltage: 2.69 V
Brady Statistic AP VP Percent: 1.1 %
Brady Statistic AP VS Percent: 2.4 %
Brady Statistic AS VP Percent: 14 %
Brady Statistic AS VS Percent: 82 %
Brady Statistic RA Percent Paced: 2.7 %
Brady Statistic RV Percent Paced: 15 %
Date Time Interrogation Session: 20210819131153
Implantable Lead Implant Date: 19990719
Implantable Lead Implant Date: 19990719
Implantable Lead Location: 753859
Implantable Lead Location: 753860
Implantable Pulse Generator Implant Date: 20110711
Lead Channel Impedance Value: 360 Ohm
Lead Channel Impedance Value: 360 Ohm
Lead Channel Pacing Threshold Amplitude: 0.5 V
Lead Channel Pacing Threshold Amplitude: 0.75 V
Lead Channel Pacing Threshold Pulse Width: 0.5 ms
Lead Channel Pacing Threshold Pulse Width: 0.7 ms
Lead Channel Sensing Intrinsic Amplitude: 5 mV
Lead Channel Sensing Intrinsic Amplitude: 9.9 mV
Lead Channel Setting Pacing Amplitude: 2 V
Lead Channel Setting Pacing Amplitude: 2.5 V
Lead Channel Setting Pacing Pulse Width: 0.5 ms
Lead Channel Setting Sensing Sensitivity: 2 mV
Pulse Gen Model: 2210
Pulse Gen Serial Number: 7152165

## 2020-06-19 NOTE — Progress Notes (Signed)
Remote pacemaker transmission.   

## 2020-06-23 MED ORDER — FUROSEMIDE 40 MG PO TABS
ORAL_TABLET | ORAL | 0 refills | Status: DC
Start: 2020-06-23 — End: 2020-07-28

## 2020-06-23 NOTE — Addendum Note (Signed)
Addended by: Thora Lance on: 06/23/2020 10:48 PM   Modules accepted: Orders

## 2020-06-24 NOTE — Telephone Encounter (Signed)
Transmission received and processed 06/18/20.

## 2020-07-20 ENCOUNTER — Ambulatory Visit (INDEPENDENT_AMBULATORY_CARE_PROVIDER_SITE_OTHER): Payer: BC Managed Care – PPO | Admitting: *Deleted

## 2020-07-20 DIAGNOSIS — I441 Atrioventricular block, second degree: Secondary | ICD-10-CM

## 2020-07-22 LAB — CUP PACEART REMOTE DEVICE CHECK
Battery Remaining Longevity: 10 mo
Battery Remaining Percentage: 8 %
Battery Voltage: 2.69 V
Brady Statistic AP VP Percent: 1.5 %
Brady Statistic AP VS Percent: 2.6 %
Brady Statistic AS VP Percent: 13 %
Brady Statistic AS VS Percent: 82 %
Brady Statistic RA Percent Paced: 3.2 %
Brady Statistic RV Percent Paced: 15 %
Date Time Interrogation Session: 20210921185635
Implantable Lead Implant Date: 19990719
Implantable Lead Implant Date: 19990719
Implantable Lead Location: 753859
Implantable Lead Location: 753860
Implantable Pulse Generator Implant Date: 20110711
Lead Channel Impedance Value: 360 Ohm
Lead Channel Impedance Value: 360 Ohm
Lead Channel Pacing Threshold Amplitude: 0.5 V
Lead Channel Pacing Threshold Amplitude: 0.75 V
Lead Channel Pacing Threshold Pulse Width: 0.5 ms
Lead Channel Pacing Threshold Pulse Width: 0.7 ms
Lead Channel Sensing Intrinsic Amplitude: 4.3 mV
Lead Channel Sensing Intrinsic Amplitude: 8.4 mV
Lead Channel Setting Pacing Amplitude: 2 V
Lead Channel Setting Pacing Amplitude: 2.5 V
Lead Channel Setting Pacing Pulse Width: 0.5 ms
Lead Channel Setting Sensing Sensitivity: 2 mV
Pulse Gen Model: 2210
Pulse Gen Serial Number: 7152165

## 2020-07-22 NOTE — Telephone Encounter (Signed)
Attempted phone call to pt.  Left voicemail to contact RN at [phone]. 

## 2020-07-22 NOTE — Progress Notes (Signed)
Remote pacemaker transmission.   

## 2020-07-22 NOTE — Addendum Note (Signed)
Addended by: Douglass Rivers D on: 07/22/2020 04:21 PM   Modules accepted: Level of Service

## 2020-07-27 ENCOUNTER — Telehealth: Payer: Self-pay | Admitting: *Deleted

## 2020-07-27 NOTE — Progress Notes (Addendum)
Electrophysiology Office Note Date: 07/28/2020  ID:  [name] Fields, DOB 1961/07/26, MRN [id number]  PCP: Patient, No Pcp Per Primary Cardiologist: No primary care provider on file. Electrophysiologist: Virl Axe, MD   CC: Pacemaker follow-up  [name] Fields is a 59 y.o. male seen today for Virl Axe, MD for routine electrophysiology followup.  Since last being seen in our clinic the patient reports doing very well. He has his upcoming DOT physical and needed a letter.  he denies chest pain, palpitations, dyspnea, PND, orthopnea, nausea, vomiting, dizziness, syncope, weight gain, or early satiety. He had mild lightheadedness while standing on the bus 9/20. Recent PPM interrogation unremarkable. He has occasional mild peripheral edema after prolonged sitting.   Device History: StMudlogger PPM implanted 1999 and gen change 2011 for high grade heart block   Past Medical History:  Diagnosis Date  . Allergy to ACE inhibitors   . Atrial tachycardia ([place])   . Cardiomyopathy, nonischemic ([place])   . Hiatal hernia   . High-grade second-degree heart block     Qualifier: Diagnosis of  By: Caryl Comes, MD, Remus Blake   . Hypertension   . Permanent pacemaker-St. Jude     DOI 1999; gen re in the summer he has o pain syndrome BP 135% at time long so is his we have been short of his device is in a in a in a Foley disease since the stents removed about 3.0-4 seconds on the episode the patient is very 59 and a [place] when his pacemaker but when he and his placement 2011  . WPW (Wolff-Parkinson-White syndrome)    s/pRFCA   Past Surgical History:  Procedure Laterality Date  . CARDIAC DEFIBRILLATOR PLACEMENT  2011   St Jude -- Accent RF  . PACEMAKER INSERTION  1999   explanted 2011    Current Outpatient Medications  Medication Sig Dispense Refill  . Ascorbic Acid (VITAMIN C) 1000 MG tablet Take 1,000 mg by mouth daily.    . fluticasone (FLONASE) 50 MCG/ACT nasal spray Place  into both nostrils as needed for allergies or rhinitis.    . hydrochlorothiazide (HYDRODIURIL) 25 MG tablet Take 1 tablet (25 mg total) by mouth daily. 90 tablet 2  . Multiple Vitamins-Minerals (MULTIVITAMIN WITH MINERALS) tablet Take 1 tablet by mouth daily.    Marland Kitchen omeprazole (PRILOSEC) 40 MG capsule Take 1 capsule (40 mg total) by mouth daily. 30 capsule 3   No current facility-administered medications for this visit.    Allergies:   Patient has no known allergies.   Social History: Social History   Socioeconomic History  . Marital status: Married    Spouse name: Not on file  . Number of children: Not on file  . Years of education: Not on file  . Highest education level: Not on file  Occupational History  . Occupation: truck Education administrator: [employer]  Tobacco Use  . Smoking status: Current Every Day Smoker    Packs/day: 0.50    Years: 43.00    Pack years: 21.50    Types: Cigarettes    Start date: 16  . Smokeless tobacco: Never Used  . Tobacco comment: Pt has stopped and satated back  Vaping Use  . Vaping Use: Never used  Substance and Sexual Activity  . Alcohol use: Yes    Comment: social  . Drug use: No  . Sexual activity: Not on file  Other Topics Concern  . Not on  file  Social History Narrative  . Not on file   Social Determinants of Health   Financial Resource Strain:   . Difficulty of Paying Living Expenses: Not on file  Food Insecurity:   . Worried About Charity fundraiser in the Last Year: Not on file  . Ran Out of Food in the Last Year: Not on file  Transportation Needs:   . Lack of Transportation (Medical): Not on file  . Lack of Transportation (Non-Medical): Not on file  Physical Activity:   . Days of Exercise per Week: Not on file  . Minutes of Exercise per Session: Not on file  Stress:   . Feeling of Stress : Not on file  Social Connections:   . Frequency of Communication with Friends and Family: Not on file  . Frequency of Social  Gatherings with Friends and Family: Not on file  . Attends Religious Services: Not on file  . Active Member of Clubs or Organizations: Not on file  . Attends Archivist Meetings: Not on file  . Marital Status: Not on file  Intimate Partner Violence:   . Fear of Current or Ex-Partner: Not on file  . Emotionally Abused: Not on file  . Physically Abused: Not on file  . Sexually Abused: Not on file    Family History: Family History  Problem Relation Age of Onset  . Cancer Mother   . Heart disease Mother   . Heart Problems Mother        pacemaker  . Hypertension Father      Review of Systems: All other systems reviewed and are otherwise negative except as noted above.  Physical Exam: Vitals:   07/28/20 1013  BP: 120/70  Pulse: 77  SpO2: 98%  Weight: 246 lb (111.6 kg)  Height: 6\' 1"  (1.854 m)     GEN- The patient is well appearing, alert and oriented x 3 today.   HEENT: normocephalic, atraumatic; sclera clear, conjunctiva pink; hearing intact; oropharynx clear; neck supple  Lungs- Clear to ausculation bilaterally, normal work of breathing.  No wheezes, rales, rhonchi Heart- Regular rate and rhythm, no murmurs, rubs or gallops  GI- soft, non-tender, non-distended, bowel sounds present  Extremities- no clubbing or cyanosis. No edema MS- no significant deformity or atrophy Skin- warm and dry, no rash or lesion; PPM pocket well healed Psych- euthymic mood, full affect Neuro- strength and sensation are intact  PPM Interrogation- Most recent remote (9/22) reviewed in detail today,  See PACEART report  EKG:  EKG is not ordered today. The ekg ordered 05/12/2020 shows NSR at 71 bpm  Recent Labs: 05/12/2020: BUN 9; Creatinine, Ser 1.12; Hemoglobin 14.2; Platelets 335; Potassium 4.6; Sodium 141   Wt Readings from Last 3 Encounters:  07/28/20 246 lb (111.6 kg)  05/28/20 (!) 240 lb (108.9 kg)  05/12/20 240 lb (108.9 kg)     Other studies Reviewed: Additional  studies/ records that were reviewed today include: Previous EP office notes, Previous remote checks, Most recent labwork.   Assessment and Plan:  1. Advanced AV block s/p St. Jude PPM  Normal PPM function See Claudia Desanctis Art report from 9/22. In office check 04/2020. No changes today  2. Cardiomyopathy - resolved LV hypertrophy Myoview 05/28/2020 showed LVEF 52%, no ischemic Encouraged compression hose as needed.   3. HTN Well controlled  4. 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   Current medicines are reviewed at length with the patient today.   The patient  does not have concerns regarding his medicines.  The following changes were made today:  none  Labs/ tests ordered today include:  Recent labwork reviewed  Disposition:   Follow up with Dr. Caryl Comes in July 2022 for his annual visit.     Jacalyn Lefevre, PA-C  07/28/2020 10:31 AM  [facility] [address] [place] [address] [phone] (office) [phone] (fax)

## 2020-07-27 NOTE — Telephone Encounter (Signed)
Pt scheduled to see Oda Kilts, PA-C on 07/28/2020 at 1005am.

## 2020-07-27 NOTE — Telephone Encounter (Signed)
Pt is scheduled to see Oda Kilts, PA-C 07/28/2020 at 1005am.

## 2020-07-27 NOTE — Telephone Encounter (Signed)
   [facility] Pre-operative Risk Assessment    HEARTCARE STAFF: - Please ensure there is not already an duplicate clearance open for this procedure. - Under Visit Info/Reason for Call, type in Other and utilize the format Clearance MM/DD/YY or Clearance TBD. Do not use dashes or single digits. - If request is for dental extraction, please clarify the # of teeth to be extracted.  Request for surgical clearance:  1. What type of surgery is being performed? LEFT CARPAL TUNNEL RELEASE   2. When is this surgery scheduled? 08/11/20   3. What type of clearance is required (medical clearance vs. Pharmacy clearance to hold med vs. Both)? MEDICAL  4. Are there any medications that need to be held prior to surgery and how long? NONE LISTED    5. Practice name and name of physician performing surgery? [facility]; DR. Mattawana   6. What is the office phone number? [phone]   7.   What is the office fax number? [phone]  8.   Anesthesia type (None, local, MAC, general) ? IV REGIONAL FOREARM BLOCK   Julaine Hua 07/27/2020, 3:21 PM  _________________________________________________________________   (provider comments below)

## 2020-07-28 ENCOUNTER — Encounter: Payer: Self-pay | Admitting: Student

## 2020-07-28 ENCOUNTER — Other Ambulatory Visit: Payer: Self-pay | Admitting: Orthopedic Surgery

## 2020-07-28 ENCOUNTER — Other Ambulatory Visit: Payer: Self-pay

## 2020-07-28 ENCOUNTER — Ambulatory Visit (INDEPENDENT_AMBULATORY_CARE_PROVIDER_SITE_OTHER): Payer: BC Managed Care – PPO | Admitting: Student

## 2020-07-28 VITALS — BP 120/70 | HR 77 | Ht 73.0 in | Wt 246.0 lb

## 2020-07-28 DIAGNOSIS — I441 Atrioventricular block, second degree: Secondary | ICD-10-CM

## 2020-07-28 DIAGNOSIS — R0600 Dyspnea, unspecified: Secondary | ICD-10-CM | POA: Diagnosis not present

## 2020-07-28 DIAGNOSIS — R0609 Other forms of dyspnea: Secondary | ICD-10-CM

## 2020-07-28 DIAGNOSIS — E669 Obesity, unspecified: Secondary | ICD-10-CM

## 2020-07-28 DIAGNOSIS — Z950 Presence of cardiac pacemaker: Secondary | ICD-10-CM | POA: Diagnosis not present

## 2020-07-28 DIAGNOSIS — G4733 Obstructive sleep apnea (adult) (pediatric): Secondary | ICD-10-CM

## 2020-07-28 DIAGNOSIS — R609 Edema, unspecified: Secondary | ICD-10-CM | POA: Diagnosis not present

## 2020-07-28 NOTE — Telephone Encounter (Signed)
   Primary Cardiologist: Dr. Caryl Fields Chart reviewed as part of pre-operative protocol coverage. The patient was doing well on cardiac stand point when seen by [name] Friar, PA today. Given past medical history and time since last visit, based on ACC/AHA guidelines, [name] Fields would be at acceptable risk for the planned procedure without further cardiovascular testing.   The patient was advised that if he develops new symptoms prior to surgery to contact our office to arrange for a follow-up visit, and he verbalized understanding.  I will route this recommendation to the requesting party via Epic fax function and remove from pre-op pool.  Please call with questions.  [place], [place] 07/28/2020, 2:32 PM

## 2020-07-28 NOTE — Patient Instructions (Addendum)
Medication Instructions:  *If you need a refill on your cardiac medications before your next appointment, please call your pharmacy*  Follow-Up: At [facility], you and your health needs are our priority.  As part of our continuing mission to provide you with exceptional heart care, we have created designated Provider Care Teams.  These Care Teams include your primary Cardiologist (physician) and Advanced Practice Providers (APPs -  Physician Assistants and Nurse Practitioners) who all work together to provide you with the care you need, when you need it.  We recommend signing up for the patient portal called "MyChart".  Sign up information is provided on this After Visit Summary.  MyChart is used to connect with patients for Virtual Visits (Telemedicine).  Patients are able to view lab/test results, encounter notes, upcoming appointments, etc.  Non-urgent messages can be sent to your provider as well.   To learn more about what you can do with MyChart, go to [email or web address].    Your next appointment:   Your physician wants you to follow-up in: July 2022 with Dr. Caryl Comes. You will receive a reminder letter in the mail two months in advance. If you don't receive a letter, please call our office to schedule the follow-up appointment.  The format for your next appointment:    In Person with Virl Axe, MD

## 2020-08-10 ENCOUNTER — Encounter: Payer: Self-pay | Admitting: Internal Medicine

## 2020-08-10 ENCOUNTER — Other Ambulatory Visit (INDEPENDENT_AMBULATORY_CARE_PROVIDER_SITE_OTHER): Payer: Self-pay | Admitting: Otolaryngology

## 2020-08-10 ENCOUNTER — Other Ambulatory Visit: Payer: Self-pay

## 2020-08-10 ENCOUNTER — Encounter (HOSPITAL_BASED_OUTPATIENT_CLINIC_OR_DEPARTMENT_OTHER): Payer: Self-pay | Admitting: Orthopedic Surgery

## 2020-08-10 DIAGNOSIS — K219 Gastro-esophageal reflux disease without esophagitis: Secondary | ICD-10-CM

## 2020-08-10 NOTE — Progress Notes (Unsigned)
[facility]ICE [facility]GRAMM[facility]  Patient Information: Name:  ELERY CADENHEAD  DOB:  10/19/1961  MRN:  [id number]  {TIP - You do not have to delete this tip  -  Copy the info from the staff message sent by the PAT staff  then press F2 here and paste the information using CTL - V on the next line :[id number]}  Planned Procedure: Left carpal Tunnel Release  Surgeon: Cyndia Diver  Date of Procedure: 08/18/2020  Cautery will be used.  Position during surgery: n/a   Please send documentation back to:  [facility] (Fax # [phone])    Device Information:  Clinic EP Physician:  Virl Axe, MD   Device Type:  Pacemaker Manufacturer and Phone #:  St. Jude/Abbott: [phone] Pacemaker Dependent?:  No. Date of Last Device Check:  07/21/20 Normal Device Function?:  Yes.    Electrophysiologist's Recommendations:   Have magnet available.  Provide continuous ECG monitoring when magnet is used or reprogramming is to be performed.   Procedure may interfere with device function.  Magnet should be placed over device during procedure.  Per Device Clinic Standing Orders, Simone Curia, RN  2:46 PM 08/10/2020

## 2020-08-14 ENCOUNTER — Other Ambulatory Visit (HOSPITAL_COMMUNITY): Payer: BC Managed Care – PPO

## 2020-08-17 ENCOUNTER — Encounter (HOSPITAL_BASED_OUTPATIENT_CLINIC_OR_DEPARTMENT_OTHER)
Admission: RE | Admit: 2020-08-17 | Discharge: 2020-08-17 | Disposition: A | Discharge status: Home / Self Care | Payer: BC Managed Care – PPO | Source: Ambulatory Visit | Attending: Orthopedic Surgery | Admitting: Orthopedic Surgery

## 2020-08-17 ENCOUNTER — Other Ambulatory Visit (HOSPITAL_COMMUNITY)
Admission: RE | Admit: 2020-08-17 | Discharge: 2020-08-17 | Disposition: A | Discharge status: Home / Self Care | Payer: BC Managed Care – PPO | Source: Ambulatory Visit | Attending: Orthopedic Surgery | Admitting: Orthopedic Surgery

## 2020-08-17 DIAGNOSIS — Z20822 Contact with and (suspected) exposure to covid-19: Secondary | ICD-10-CM | POA: Diagnosis not present

## 2020-08-17 DIAGNOSIS — Z01818 Encounter for other preprocedural examination: Secondary | ICD-10-CM | POA: Insufficient documentation

## 2020-08-17 DIAGNOSIS — Z950 Presence of cardiac pacemaker: Secondary | ICD-10-CM | POA: Diagnosis not present

## 2020-08-17 DIAGNOSIS — Z981 Arthrodesis status: Secondary | ICD-10-CM | POA: Diagnosis not present

## 2020-08-17 DIAGNOSIS — G5602 Carpal tunnel syndrome, left upper limb: Secondary | ICD-10-CM | POA: Diagnosis present

## 2020-08-17 DIAGNOSIS — Z79899 Other long term (current) drug therapy: Secondary | ICD-10-CM | POA: Diagnosis not present

## 2020-08-17 DIAGNOSIS — I43 Cardiomyopathy in diseases classified elsewhere: Secondary | ICD-10-CM | POA: Diagnosis not present

## 2020-08-17 DIAGNOSIS — I456 Pre-excitation syndrome: Secondary | ICD-10-CM | POA: Diagnosis not present

## 2020-08-17 DIAGNOSIS — F17210 Nicotine dependence, cigarettes, uncomplicated: Secondary | ICD-10-CM | POA: Diagnosis not present

## 2020-08-17 DIAGNOSIS — I119 Hypertensive heart disease without heart failure: Secondary | ICD-10-CM | POA: Diagnosis not present

## 2020-08-17 LAB — BASIC METABOLIC PANEL
Anion gap: 9 (ref 5–15)
BUN: 14 mg/dL (ref 6–20)
CO2: 25 mmol/L (ref 22–32)
Calcium: 9.3 mg/dL (ref 8.9–10.3)
Chloride: 104 mmol/L (ref 98–111)
Creatinine, Ser: 1.16 mg/dL (ref 0.61–1.24)
GFR, Estimated: >60 mL/min (ref >60)
Glucose, Bld: 120 mg/dL — ABNORMAL HIGH (ref 70–99)
Potassium: 3.7 mmol/L (ref 3.5–5.1)
Sodium: 138 mmol/L (ref 135–145)

## 2020-08-17 LAB — SARS CORONAVIRUS 2 (TAT 6-24 HRS): SARS Coronavirus 2: NEGATIVE

## 2020-08-17 NOTE — Progress Notes (Signed)

## 2020-08-18 ENCOUNTER — Ambulatory Visit (HOSPITAL_BASED_OUTPATIENT_CLINIC_OR_DEPARTMENT_OTHER): Payer: BC Managed Care – PPO | Admitting: Anesthesiology

## 2020-08-18 ENCOUNTER — Other Ambulatory Visit: Payer: Self-pay

## 2020-08-18 ENCOUNTER — Encounter (HOSPITAL_BASED_OUTPATIENT_CLINIC_OR_DEPARTMENT_OTHER): Payer: Self-pay | Admitting: Orthopedic Surgery

## 2020-08-18 ENCOUNTER — Encounter (HOSPITAL_BASED_OUTPATIENT_CLINIC_OR_DEPARTMENT_OTHER)
Admission: RE | Disposition: A | Discharge status: Home / Self Care | Payer: Self-pay | Source: Home / Self Care | Attending: Orthopedic Surgery

## 2020-08-18 ENCOUNTER — Ambulatory Visit (HOSPITAL_BASED_OUTPATIENT_CLINIC_OR_DEPARTMENT_OTHER)
Admission: RE | Admit: 2020-08-18 | Discharge: 2020-08-18 | Disposition: A | Discharge status: Home / Self Care | Payer: BC Managed Care – PPO | Attending: Orthopedic Surgery | Admitting: Orthopedic Surgery

## 2020-08-18 DIAGNOSIS — F17210 Nicotine dependence, cigarettes, uncomplicated: Secondary | ICD-10-CM | POA: Insufficient documentation

## 2020-08-18 DIAGNOSIS — I119 Hypertensive heart disease without heart failure: Secondary | ICD-10-CM | POA: Insufficient documentation

## 2020-08-18 DIAGNOSIS — Z79899 Other long term (current) drug therapy: Secondary | ICD-10-CM | POA: Insufficient documentation

## 2020-08-18 DIAGNOSIS — I43 Cardiomyopathy in diseases classified elsewhere: Secondary | ICD-10-CM | POA: Insufficient documentation

## 2020-08-18 DIAGNOSIS — G5602 Carpal tunnel syndrome, left upper limb: Secondary | ICD-10-CM | POA: Insufficient documentation

## 2020-08-18 DIAGNOSIS — Z981 Arthrodesis status: Secondary | ICD-10-CM | POA: Insufficient documentation

## 2020-08-18 DIAGNOSIS — I456 Pre-excitation syndrome: Secondary | ICD-10-CM | POA: Insufficient documentation

## 2020-08-18 DIAGNOSIS — Z950 Presence of cardiac pacemaker: Secondary | ICD-10-CM | POA: Insufficient documentation

## 2020-08-18 HISTORY — DX: Presence of cardiac pacemaker: Z95.0

## 2020-08-18 HISTORY — PX: CARPAL TUNNEL RELEASE: SHX101

## 2020-08-18 SURGERY — CARPAL TUNNEL RELEASE
Anesthesia: Monitor Anesthesia Care | Site: Arm Lower | Laterality: Left

## 2020-08-18 MED ORDER — CEFAZOLIN SODIUM-DEXTROSE 2-4 GM/100ML-% IV SOLN: 2.0000 g | INTRAVENOUS | Status: DC

## 2020-08-18 MED ORDER — CEFAZOLIN SODIUM-DEXTROSE 2-4 GM/100ML-% IV SOLN
INTRAVENOUS | Status: AC
  Filled 2020-08-18: qty 100

## 2020-08-18 MED ORDER — BUPIVACAINE HCL (PF) 0.25 % IJ SOLN
INTRAMUSCULAR | Status: DC | PRN
  Administered 2020-08-18: 10 mL

## 2020-08-18 MED ORDER — LACTATED RINGERS IV SOLN: INTRAVENOUS | Status: DC

## 2020-08-18 MED ORDER — OXYCODONE HCL 5 MG PO TABS: 5.0000 mg | ORAL_TABLET | Freq: Once | ORAL | Status: DC | PRN

## 2020-08-18 MED ORDER — TRAMADOL HCL 50 MG PO TABS
50.0000 mg | ORAL_TABLET | Freq: Four times a day (QID) | ORAL | 0 refills | Status: DC | PRN
Start: 2020-08-18 — End: 2021-08-24

## 2020-08-18 MED ORDER — PROPOFOL 500 MG/50ML IV EMUL
INTRAVENOUS | Status: AC
  Filled 2020-08-18: qty 50

## 2020-08-18 MED ORDER — MIDAZOLAM HCL 2 MG/2ML IJ SOLN
INTRAMUSCULAR | Status: AC
  Filled 2020-08-18: qty 2

## 2020-08-18 MED ORDER — OXYCODONE HCL 5 MG/5ML PO SOLN: 5.0000 mg | Freq: Once | ORAL | Status: DC | PRN

## 2020-08-18 MED ORDER — ONDANSETRON HCL 4 MG/2ML IJ SOLN
INTRAMUSCULAR | Status: DC | PRN
  Administered 2020-08-18: 4 mg via INTRAVENOUS

## 2020-08-18 MED ORDER — FENTANYL CITRATE (PF) 100 MCG/2ML IJ SOLN: 25.0000 ug | INTRAMUSCULAR | Status: DC | PRN

## 2020-08-18 MED ORDER — FENTANYL CITRATE (PF) 100 MCG/2ML IJ SOLN
INTRAMUSCULAR | Status: AC
  Filled 2020-08-18: qty 2

## 2020-08-18 MED ORDER — PROPOFOL 500 MG/50ML IV EMUL
INTRAVENOUS | Status: DC | PRN
  Administered 2020-08-18: 75 ug/kg/min via INTRAVENOUS

## 2020-08-18 MED ORDER — FENTANYL CITRATE (PF) 100 MCG/2ML IJ SOLN
INTRAMUSCULAR | Status: DC | PRN
Start: 2020-08-18 — End: 2020-08-18
  Administered 2020-08-18: 100 ug via INTRAVENOUS

## 2020-08-18 MED ORDER — PROPOFOL 500 MG/50ML IV EMUL
INTRAVENOUS | Status: DC | PRN
  Administered 2020-08-18: 30 mg via INTRAVENOUS

## 2020-08-18 MED ORDER — MIDAZOLAM HCL 5 MG/5ML IJ SOLN
INTRAMUSCULAR | Status: DC | PRN
  Administered 2020-08-18: 2 mg via INTRAVENOUS

## 2020-08-18 MED ORDER — LIDOCAINE HCL (PF) 0.5 % IJ SOLN
INTRAMUSCULAR | Status: DC | PRN
  Administered 2020-08-18: 30 mL via INTRAVENOUS

## 2020-08-18 MED ORDER — ONDANSETRON HCL 4 MG/2ML IJ SOLN: 4.0000 mg | Freq: Once | INTRAMUSCULAR | Status: DC | PRN

## 2020-08-18 SURGICAL SUPPLY — 38 items
APL PRP STRL LF DISP 70% ISPRP (MISCELLANEOUS) ×1
BLADE SURG 15 STRL LF DISP TIS (BLADE) ×1 IMPLANT
BLADE SURG 15 STRL SS (BLADE) ×2
BNDG CMPR 9X4 STRL LF SNTH (GAUZE/BANDAGES/DRESSINGS) ×1
BNDG COHESIVE 3X5 TAN STRL LF (GAUZE/BANDAGES/DRESSINGS) ×2 IMPLANT
BNDG ESMARK 4X9 LF (GAUZE/BANDAGES/DRESSINGS) ×1 IMPLANT
BNDG GAUZE ELAST 4 BULKY (GAUZE/BANDAGES/DRESSINGS) ×2 IMPLANT
CHLORAPREP W/TINT 26 (MISCELLANEOUS) ×2 IMPLANT
CORD BIPOLAR FORCEPS 12FT (ELECTRODE) ×2 IMPLANT
COVER BACK TABLE 60X90IN (DRAPES) ×2 IMPLANT
COVER MAYO STAND STRL (DRAPES) ×2 IMPLANT
COVER WAND RF STERILE (DRAPES) IMPLANT
CUFF TOURN SGL QUICK 18X4 (TOURNIQUET CUFF) ×2 IMPLANT
DRAPE EXTREMITY T 121X128X90 (DISPOSABLE) ×2 IMPLANT
DRAPE SURG 17X23 STRL (DRAPES) ×2 IMPLANT
DRSG PAD ABDOMINAL 8X10 ST (GAUZE/BANDAGES/DRESSINGS) ×2 IMPLANT
GAUZE SPONGE 4X4 12PLY STRL (GAUZE/BANDAGES/DRESSINGS) ×2 IMPLANT
GAUZE XEROFORM 1X8 LF (GAUZE/BANDAGES/DRESSINGS) ×2 IMPLANT
GLOVE BIOGEL PI IND STRL 7.0 (GLOVE) IMPLANT
GLOVE BIOGEL PI IND STRL 8.5 (GLOVE) ×1 IMPLANT
GLOVE BIOGEL PI INDICATOR 7.0 (GLOVE) ×1
GLOVE BIOGEL PI INDICATOR 8.5 (GLOVE) ×1
GLOVE ECLIPSE 6.5 STRL STRAW (GLOVE) ×1 IMPLANT
GLOVE SURG ORTHO 8.0 STRL STRW (GLOVE) ×2 IMPLANT
GOWN STRL REUS W/ TWL LRG LVL3 (GOWN DISPOSABLE) ×1 IMPLANT
GOWN STRL REUS W/TWL LRG LVL3 (GOWN DISPOSABLE) ×4
GOWN STRL REUS W/TWL XL LVL3 (GOWN DISPOSABLE) ×2 IMPLANT
NDL PRECISIONGLIDE 27X1.5 (NEEDLE) IMPLANT
NEEDLE PRECISIONGLIDE 27X1.5 (NEEDLE) ×2 IMPLANT
NS IRRIG 1000ML POUR BTL (IV SOLUTION) ×2 IMPLANT
PACK BASIN DAY SURGERY FS (CUSTOM PROCEDURE TRAY) ×2 IMPLANT
STOCKINETTE 4X48 STRL (DRAPES) ×2 IMPLANT
SUT ETHILON 4 0 PS 2 18 (SUTURE) ×2 IMPLANT
SUT VICRYL 4-0 PS2 18IN ABS (SUTURE) IMPLANT
SYR BULB EAR ULCER 3OZ GRN STR (SYRINGE) ×2 IMPLANT
SYR CONTROL 10ML LL (SYRINGE) ×1 IMPLANT
TOWEL GREEN STERILE FF (TOWEL DISPOSABLE) ×2 IMPLANT
UNDERPAD 30X36 HEAVY ABSORB (UNDERPADS AND DIAPERS) ×2 IMPLANT

## 2020-08-18 NOTE — Transfer of Care (Signed)
Immediate Anesthesia Transfer of Care Note  Patient: [name] Fields  Procedure(s) Performed: CARPAL TUNNEL RELEASE (Left Arm Lower)  Patient Location: PACU  Anesthesia Type:MAC and Bier block  Level of Consciousness: sedated  Airway & Oxygen Therapy: Patient Spontanous Breathing and Patient connected to face mask oxygen  Post-op Assessment: Report given to RN and Post -op Vital signs reviewed and stable  Post vital signs: Reviewed and stable  Last Vitals:  Vitals Value Taken Time  BP 124/69 08/18/20 1224  Temp    Pulse 64 08/18/20 1227  Resp 19 08/18/20 1227  SpO2 97 % 08/18/20 1227  Vitals shown include unvalidated device data.  Last Pain:  Vitals:   08/18/20 0952  TempSrc: Oral  PainSc: 9       Patients Stated Pain Goal: 7 (61/51/83 4373)  Complications: No complications documented.

## 2020-08-18 NOTE — Discharge Instructions (Addendum)

## 2020-08-18 NOTE — Anesthesia Procedure Notes (Signed)
Anesthesia Regional Block: Bier block (IV Regional)   Pre-Anesthetic Checklist: ,, timeout performed, Correct Patient, Correct Site, Correct Laterality, Correct Procedure, Correct Position, site marked, Risks and benefits discussed, Surgical consent,  Pre-op evaluation,  At surgeon's request  Laterality: Left     Needles:  Injection technique: Single-shot      Additional Needles:   Procedures:,,,,,, Esmarch exsanguination, single tourniquet utilized,  Narrative:  Start time: 08/18/2020 11:53 AM End time: 08/18/2020 11:54 AM  Performed by: Personally

## 2020-08-18 NOTE — Anesthesia Preprocedure Evaluation (Signed)
Anesthesia Evaluation  Patient identified by MRN, date of birth, ID band Patient awake    Reviewed: Allergy & Precautions, NPO status , Patient's Chart, lab work & pertinent test results  Airway Mallampati: III  TM Distance: >3 FB Neck ROM: Full    Dental  (+) Teeth Intact, Dental Advisory Given   Pulmonary Current Smoker,    breath sounds clear to auscultation       Cardiovascular hypertension,  Rhythm:Regular Rate:Normal     Neuro/Psych    GI/Hepatic   Endo/Other    Renal/GU      Musculoskeletal   Abdominal   Peds  Hematology   Anesthesia Other Findings   Reproductive/Obstetrics                             Anesthesia Physical Anesthesia Plan  ASA: III  Anesthesia Plan: MAC and Bier Block and Bier Block-LIDOCAINE ONLY   Post-op Pain Management:    Induction: Intravenous  PONV Risk Score and Plan: Ondansetron and Dexamethasone  Airway Management Planned: Simple Face Mask and Natural Airway  Additional Equipment:   Intra-op Plan:   Post-operative Plan:   Informed Consent: I have reviewed the patients History and Physical, chart, labs and discussed the procedure including the risks, benefits and alternatives for the proposed anesthesia with the patient or authorized representative who has indicated his/her understanding and acceptance.       Plan Discussed with: Anesthesiologist  Anesthesia Plan Comments:         Anesthesia Quick Evaluation

## 2020-08-18 NOTE — Anesthesia Procedure Notes (Signed)
Date/Time: 08/18/2020 11:47 AM Performed by: Glory Buff, CRNA Oxygen Delivery Method: Simple face mask

## 2020-08-18 NOTE — Op Note (Signed)
NAME: [name] Fields RECORD NO: [id number] DATE OF BIRTH: Mar 17, 1961 FACILITY[facility] LOCATION: [facility] PHYSICIAN: Wynonia Sours, MD   OPERATIVE REPORT   DATE OF PROCEDURE: 08/18/20    PREOPERATIVE DIAGNOSIS:   Carpal tunnel syndrome left hand   POSTOPERATIVE DIAGNOSIS:   Same   PROCEDURE:   Decompression median nerve left hand   SURGEON: Daryll Brod, M.D.   ASSISTANT: none   ANESTHESIA:  Bier block with sedation and Local   INTRAVENOUS FLUIDS:  Per anesthesia flow sheet.   ESTIMATED BLOOD LOSS:  Minimal.   COMPLICATIONS:  None.   SPECIMENS:  none   TOURNIQUET TIME:    Total Tourniquet Time Documented: Forearm (Left) - 24 minutes Total: Forearm (Left) - 24 minutes    DISPOSITION:  Stable to PACU.   INDICATIONS:  Patient is a 59 year old male with history of numbness and tingling bilateral hands d not responsive to conservative treatment.  Nerve conductions are positive.  He is like to undergo surgical decompression median nerve left hand.  Preperi-and postoperative course been discussed along with risks and complications.  He is aware that there is no guarantee to the surgery the possibility of infection recurrence injury to arteries nerves tendons incomplete relief of symptoms and dystrophy.  In the preoperative area the patient is seen the extremity marked by both patient and surgeon antibiotic given  OPERATIVE COURSE: Patient is brought to the operating room where a forearm-based IV regional anesthetic was carried out without difficulty under the direction of the anesthesia department after being placed in the supine position with the left arm free.  Prep was done with ChloraPrep.  3-minute dry time was allowed and a timeout taken to confirm patient procedure.  A longitudinal incision was made left palm carried down through subcutaneous tissue.  Bleeders were electrocauterized with bipolar.  The palmar fascia was split.  The superficial palmar  arch was identified along with the flexor tendon to the ring little finger.  The median nerve flexor tendons were retracted radially the ulnar nerve ulnarly.  Flexor retinaculum was then released on its ulnar border.  A right angle and a Sectseeretractor were placed between the the forearm fascia and subcutaneous tissue and skin.  This allowed visualization of the proximal aspect of the flexor retinaculum and distal forearm fascia this was then released for approximately 3 cm proximal to the wrist crease under direct vision.  The canal was explored.  An area compression of the nerve is apparent.  Motor branch underneath the muscle distally.  The wound was copiously irrigated with saline.  The skin was then closed with interrupted 4-0 nylon sutures.  A local infiltration quarter percent bupivacaine without epinephrine was given approximately 10 cc was used.  Sterile compressive dressing with the fingers three was applied.  Deflation of the tourniquet all fingers immediately pink.  He was taken to the recovery room for observation in satisfactory condition.  He will be discharged home to return to the hand center of [facility] in 1 week on Tylenol ibuprofen for pain with Ultram for breakthrough.   Daryll Brod, MD Electronically signed, 08/18/20
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
## 2020-08-19 ENCOUNTER — Encounter (HOSPITAL_BASED_OUTPATIENT_CLINIC_OR_DEPARTMENT_OTHER): Payer: Self-pay | Admitting: Orthopedic Surgery

## 2020-08-20 ENCOUNTER — Telehealth: Payer: Self-pay

## 2020-08-20 ENCOUNTER — Ambulatory Visit (INDEPENDENT_AMBULATORY_CARE_PROVIDER_SITE_OTHER): Payer: BC Managed Care – PPO

## 2020-08-20 DIAGNOSIS — I441 Atrioventricular block, second degree: Secondary | ICD-10-CM

## 2020-08-20 LAB — CUP PACEART REMOTE DEVICE CHECK
Battery Remaining Longevity: 9 mo
Battery Remaining Percentage: 8 %
Battery Voltage: 2.69 V
Brady Statistic AP VP Percent: 1.6 %
Brady Statistic AP VS Percent: 2.9 %
Brady Statistic AS VP Percent: 13 %
Brady Statistic AS VS Percent: 81 %
Brady Statistic RA Percent Paced: 3.6 %
Brady Statistic RV Percent Paced: 15 %
Date Time Interrogation Session: 20211021091232
Implantable Lead Implant Date: 19990719
Implantable Lead Implant Date: 19990719
Implantable Lead Location: 753859
Implantable Lead Location: 753860
Implantable Pulse Generator Implant Date: 20110711
Lead Channel Impedance Value: 330 Ohm
Lead Channel Impedance Value: 340 Ohm
Lead Channel Pacing Threshold Amplitude: 0.5 V
Lead Channel Pacing Threshold Amplitude: 0.75 V
Lead Channel Pacing Threshold Pulse Width: 0.5 ms
Lead Channel Pacing Threshold Pulse Width: 0.7 ms
Lead Channel Sensing Intrinsic Amplitude: 4.5 mV
Lead Channel Sensing Intrinsic Amplitude: 8.3 mV
Lead Channel Setting Pacing Amplitude: 2 V
Lead Channel Setting Pacing Amplitude: 2.5 V
Lead Channel Setting Pacing Pulse Width: 0.5 ms
Lead Channel Setting Sensing Sensitivity: 2 mV
Pulse Gen Model: 2210
Pulse Gen Serial Number: 7152165

## 2020-08-20 NOTE — Telephone Encounter (Signed)
The pt left a message on the voicemail wanting to know if we received his transmission. I called and left a message for him to call the office. We did receive his transmission.

## 2020-08-21 ENCOUNTER — Encounter (HOSPITAL_BASED_OUTPATIENT_CLINIC_OR_DEPARTMENT_OTHER): Payer: Self-pay | Admitting: Orthopedic Surgery

## 2020-08-21 NOTE — Anesthesia Postprocedure Evaluation (Signed)
Anesthesia Post Note  Patient: [name] Fields  Procedure(s) Performed: CARPAL TUNNEL RELEASE (Left Arm Lower)     Patient location during evaluation: PACU Anesthesia Type: MAC Level of consciousness: awake and alert Pain management: pain level controlled Vital Signs Assessment: post-procedure vital signs reviewed and stable Respiratory status: spontaneous breathing, nonlabored ventilation, respiratory function stable and patient connected to nasal cannula oxygen Cardiovascular status: stable and blood pressure returned to baseline Postop Assessment: no apparent nausea or vomiting Anesthetic complications: no   No complications documented.  Last Vitals:  Vitals:   08/18/20 1324 08/18/20 1405  BP:  (!) 145/87  Pulse: 64 79  Resp: 14 16  Temp:  36.5 C  SpO2: 100% 95%    Last Pain:  Vitals:   08/19/20 1026  TempSrc:   PainSc: 4                  Andray Assefa COKER

## 2020-08-26 NOTE — Addendum Note (Signed)
Addended by: Douglass Rivers D on: 08/26/2020 12:59 PM   Modules accepted: Level of Service

## 2020-08-26 NOTE — Progress Notes (Signed)
Remote pacemaker transmission.   

## 2020-09-21 ENCOUNTER — Ambulatory Visit: Payer: BC Managed Care – PPO

## 2020-09-23 LAB — CUP PACEART REMOTE DEVICE CHECK
Battery Remaining Longevity: 5 mo
Battery Remaining Percentage: 5 %
Battery Voltage: 2.66 V
Brady Statistic AP VP Percent: 1.7 %
Brady Statistic AP VS Percent: 3.1 %
Brady Statistic AS VP Percent: 13 %
Brady Statistic AS VS Percent: 81 %
Brady Statistic RA Percent Paced: 3.8 %
Brady Statistic RV Percent Paced: 15 %
Date Time Interrogation Session: 20211123103920
Implantable Lead Implant Date: 19990719
Implantable Lead Implant Date: 19990719
Implantable Lead Location: 753859
Implantable Lead Location: 753860
Implantable Pulse Generator Implant Date: 20110711
Lead Channel Impedance Value: 360 Ohm
Lead Channel Impedance Value: 360 Ohm
Lead Channel Pacing Threshold Amplitude: 0.5 V
Lead Channel Pacing Threshold Amplitude: 0.75 V
Lead Channel Pacing Threshold Pulse Width: 0.5 ms
Lead Channel Pacing Threshold Pulse Width: 0.7 ms
Lead Channel Sensing Intrinsic Amplitude: 3.4 mV
Lead Channel Sensing Intrinsic Amplitude: 9.4 mV
Lead Channel Setting Pacing Amplitude: 2 V
Lead Channel Setting Pacing Amplitude: 2.5 V
Lead Channel Setting Pacing Pulse Width: 0.5 ms
Lead Channel Setting Sensing Sensitivity: 2 mV
Pulse Gen Model: 2210
Pulse Gen Serial Number: 7152165

## 2020-10-22 ENCOUNTER — Ambulatory Visit (INDEPENDENT_AMBULATORY_CARE_PROVIDER_SITE_OTHER): Payer: BC Managed Care – PPO

## 2020-10-22 DIAGNOSIS — I428 Other cardiomyopathies: Secondary | ICD-10-CM

## 2020-10-22 LAB — CUP PACEART REMOTE DEVICE CHECK
Battery Remaining Longevity: 3 mo
Battery Remaining Percentage: 3 %
Battery Voltage: 2.65 V
Brady Statistic AP VP Percent: 1.6 %
Brady Statistic AP VS Percent: 3 %
Brady Statistic AS VP Percent: 13 %
Brady Statistic AS VS Percent: 81 %
Brady Statistic RA Percent Paced: 3.6 %
Brady Statistic RV Percent Paced: 15 %
Date Time Interrogation Session: 20211223131252
Implantable Lead Implant Date: 19990719
Implantable Lead Implant Date: 19990719
Implantable Lead Location: 753859
Implantable Lead Location: 753860
Implantable Pulse Generator Implant Date: 20110711
Lead Channel Impedance Value: 360 Ohm
Lead Channel Impedance Value: 360 Ohm
Lead Channel Pacing Threshold Amplitude: 0.5 V
Lead Channel Pacing Threshold Amplitude: 0.75 V
Lead Channel Pacing Threshold Pulse Width: 0.5 ms
Lead Channel Pacing Threshold Pulse Width: 0.7 ms
Lead Channel Sensing Intrinsic Amplitude: 10.1 mV
Lead Channel Sensing Intrinsic Amplitude: 5 mV
Lead Channel Setting Pacing Amplitude: 2 V
Lead Channel Setting Pacing Amplitude: 2.5 V
Lead Channel Setting Pacing Pulse Width: 0.5 ms
Lead Channel Setting Sensing Sensitivity: 2 mV
Pulse Gen Model: 2210
Pulse Gen Serial Number: 7152165

## 2020-11-03 ENCOUNTER — Other Ambulatory Visit: Payer: Self-pay | Admitting: Orthopedic Surgery

## 2020-11-04 ENCOUNTER — Telehealth: Payer: Self-pay | Admitting: *Deleted

## 2020-11-04 NOTE — Telephone Encounter (Signed)
Pt agreeable to needing pre op appt for clearance. I offered today, though the pt is in [place], pt is a [employer]. Pt has been scheduled to see Dr. Mayford Knife 11/20/20 @ 9 am. Pt thanked me for the call and the help. I will send note to MD for upcoming appt.. I will send FYI to requesting office pt is needing appt for pre op assessment.

## 2020-11-04 NOTE — Telephone Encounter (Signed)
   Primary Cardiologist: Dr Mayford Knife Dr Graciela Husbands  Chart reviewed as part of pre-operative protocol coverage. Because of [name] Fields's past medical history and time since last visit, he will require a follow-up visit in order to better assess preoperative cardiovascular risk.  The patient was contacted today by phone.  He has had a couple of episodes of SOB.  He should be seen in the office prior to granting pre op clearance.   Pre-op covering staff: - Please schedule appointment and call patient to inform them. If patient already had an upcoming appointment within acceptable timeframe, please add "pre-op clearance" to the appointment notes so provider is aware. - Please contact requesting surgeon's office via preferred method (i.e, phone, fax) to inform them of need for appointment prior to surgery.  If applicable, this message will also be routed to pharmacy pool and/or primary cardiologist for input on holding anticoagulant/antiplatelet agent as requested below so that this information is available to the clearing provider at time of patient's appointment.   Corine Shelter, PA-C  11/04/2020, 10:12 AM

## 2020-11-04 NOTE — Telephone Encounter (Signed)
   [facility] Pre-operative Risk Assessment    HEARTCARE STAFF: - Please ensure there is not already an duplicate clearance open for this procedure. - Under Visit Info/Reason for Call, type in Other and utilize the format Clearance MM/DD/YY or Clearance TBD. Do not use dashes or single digits. - If request is for dental extraction, please clarify the # of teeth to be extracted.  Request for surgical clearance:  1. What type of surgery is being performed? RIGHT CARPAL TUNNEL RELEASE   2. When is this surgery scheduled? 12/01/20   3. What type of clearance is required (medical clearance vs. Pharmacy clearance to hold med vs. Both)? MEDICAL  4. Are there any medications that need to be held prior to surgery and how long? NONE LISTED   5. Practice name and name of physician performing surgery? [facility]; DR. [place]   6. What is the office phone number? [phone]   7.   What is the office fax number? [place].   Anesthesia type (None, local, MAC, general) ? IV REGIONAL FOREARM BLOCK   Julaine Hua 11/04/2020, 9:49 AM  _________________________________________________________________   (provider comments below)

## 2020-11-04 NOTE — Progress Notes (Signed)
Remote pacemaker transmission.   

## 2020-11-20 ENCOUNTER — Other Ambulatory Visit: Payer: Self-pay

## 2020-11-20 ENCOUNTER — Ambulatory Visit: Payer: BC Managed Care – PPO | Admitting: Student

## 2020-11-20 ENCOUNTER — Telehealth: Payer: Self-pay | Admitting: Emergency Medicine

## 2020-11-20 ENCOUNTER — Ambulatory Visit: Payer: BC Managed Care – PPO | Admitting: Cardiology

## 2020-11-20 ENCOUNTER — Encounter: Payer: Self-pay | Admitting: Student

## 2020-11-20 VITALS — BP 130/70 | HR 64 | Ht 72.0 in | Wt 255.0 lb

## 2020-11-20 DIAGNOSIS — I441 Atrioventricular block, second degree: Secondary | ICD-10-CM

## 2020-11-20 DIAGNOSIS — R0609 Other forms of dyspnea: Secondary | ICD-10-CM

## 2020-11-20 DIAGNOSIS — I428 Other cardiomyopathies: Secondary | ICD-10-CM

## 2020-11-20 DIAGNOSIS — R0600 Dyspnea, unspecified: Secondary | ICD-10-CM

## 2020-11-20 NOTE — Telephone Encounter (Addendum)
Patient seen in office today. In clinic interrogation 11/20/2020 showing ERI within 3 months. Next scheduled remote 11/23/20. Monthly battery checks scheduled. Next monthly battery check scheduled on 12/24/2020. Patient has remote/battery checks scheduled/listed on after visit summary. Patient does send manual remote transmissions.

## 2020-11-20 NOTE — Patient Instructions (Signed)
Medication Instructions:  Your physician recommends that you continue on your current medications as directed. Please refer to the Current Medication list given to you today.  *If you need a refill on your cardiac medications before your next appointment, please call your pharmacy*   Lab Work: None ordered   If you have labs (blood work) drawn today and your tests are completely normal, you will receive your results only by: Marland Kitchen MyChart Message (if you have MyChart) OR . A paper copy in the mail If you have any lab test that is abnormal or we need to change your treatment, we will call you to review the results.   Testing/Procedures: None ordered    Follow-Up: At [facility], you and your health needs are our priority.  As part of our continuing mission to provide you with exceptional heart care, we have created designated Provider Care Teams.  These Care Teams include your primary Cardiologist (physician) and Advanced Practice Providers (APPs -  Physician Assistants and Nurse Practitioners) who all work together to provide you with the care you need, when you need it.  We recommend signing up for the patient portal called "MyChart".  Sign up information is provided on this After Visit Summary.  MyChart is used to connect with patients for Virtual Visits (Telemedicine).  Patients are able to view lab/test results, encounter notes, upcoming appointments, etc.  Non-urgent messages can be sent to your provider as well.   To learn more about what you can do with MyChart, go to [email or web address].    Your next appointment:   3 month(s)  The format for your next appointment:   In Person  Provider:   You may see Virl Axe, MD or one of the following Advanced Practice Providers on your designated Care Team:    Chanetta Marshall, NP  Tommye Standard, [place]  Legrand Como "Jonni Sanger" [place], [place]    Other Instructions None

## 2020-11-20 NOTE — Progress Notes (Signed)
Electrophysiology Office Note Date: 11/20/2020  ID:  GERREL SALMELA, DOB Aug 20, 1961, MRN [id number]  PCP: Patient, No Pcp Per Primary Cardiologist: No primary care provider on file. Electrophysiologist: Virl Axe, MD   CC: Pacemaker follow-up  [name] Fields is a 60 y.o. male seen today for Virl Axe, MD for cardiac clearance for right carpal tunnel release.  Since last being seen in our clinic the patient reports doing very well overall.  He has described occasional SOB but this does not happen with any clear regularity. He continues to smoke.  He has had lightheadedness, but primarily associates this with high altitudes, specifically when flying. No syncope or correlating episodes on his device. He has had at least high ventricular rate episodes recently. These were both associated with strenuous activity and he was asymptomatic.   Otherwise he is doing very well. He is scheduled for R carpal tunnel release, having previously undergone left carpal tunnel release. He has approx 3 months until his device reaches ERI.   Device History: [employer], minerals PPM implanted 1999 and gen change 2011 for high grade heart block   Past Medical History:  Diagnosis Date   Allergy to ACE inhibitors    Atrial tachycardia (HCC)    Cardiomyopathy, nonischemic ([place])    Hiatal hernia    High-grade second-degree heart block     Qualifier: Diagnosis of  By: Caryl Comes, MD, Remus Blake    Hypertension    Permanent pacemaker-[employer] [place]; gen re in the summer he has o pain syndrome BP 135% at time long so is his we have been short of his device is in a in a in a Foley disease since the stents removed about 3.0-4 seconds on the episode the patient is very 8 and a [place] when his pacemaker but when he and his placement 2011   Presence of permanent cardiac pacemaker    WPW (Wolff-Parkinson-White syndrome)    s/pRFCA   Past Surgical History:  Procedure Laterality Date    CARDIAC DEFIBRILLATOR PLACEMENT  2011   St Jude -- Accent RF   CARPAL TUNNEL RELEASE Left 08/18/2020   Procedure: CARPAL TUNNEL RELEASE;  Surgeon: Daryll Brod, MD;  Location: [facility];  Service: Orthopedics;  Laterality: Left;  IV REGIONAL FOREARM BLOCK   PACEMAKER INSERTION  1999   explanted 2011    Current Outpatient Medications  Medication Sig Dispense Refill   Ascorbic Acid (VITAMIN C) 1000 MG tablet Take 1,000 mg by mouth daily.     fluticasone (FLONASE) 50 MCG/ACT nasal spray Place into both nostrils as needed for allergies or rhinitis.     hydrochlorothiazide (HYDRODIURIL) 25 MG tablet Take 1 tablet (25 mg total) by mouth daily. 90 tablet 2   Multiple Vitamins-Minerals (MULTIVITAMIN WITH MINERALS) tablet Take 1 tablet by mouth daily.     omeprazole (PRILOSEC) 40 MG capsule TAKE 1 CAPSULE BY MOUTH ONCE DAILY BEFORE  DINNER 30 capsule 0   traMADol (ULTRAM) 50 MG tablet Take 1 tablet (50 mg total) by mouth every 6 (six) hours as needed. 20 tablet 0   No current facility-administered medications for this visit.    Allergies:   Patient has no known allergies.   Social History: Social History   Socioeconomic History   Marital status: Married    Spouse name: Not on file   Number of children: Not on file   Years of education: Not on file   Highest education  level: Not on file  Occupational History   Occupation: truck Education administrator: [employer]  Tobacco Use   Smoking status: Current Every Day Smoker    Packs/day: 0.50    Years: 43.00    Pack years: 21.50    Types: Cigarettes    Start date: 1978   Smokeless tobacco: Never Used   Tobacco comment: Pt has stopped and satated back  Vaping Use   Vaping Use: Never used  Substance and Sexual Activity   Alcohol use: Yes    Comment: social   Drug use: No   Sexual activity: Yes  Other Topics Concern   Not on file  Social History Narrative   Not on file   Social Determinants of  Health   Financial Resource Strain: Not on file  Food Insecurity: Not on file  Transportation Needs: Not on file  Physical Activity: Not on file  Stress: Not on file  Social Connections: Not on file  Intimate Partner Violence: Not on file    Family History: Family History  Problem Relation Age of Onset   Cancer Mother    Heart disease Mother    Heart Problems Mother        pacemaker   Hypertension Father      Review of Systems: All other systems reviewed and are otherwise negative except as noted above.  Physical Exam: Vitals:   11/20/20 1155  BP: 130/70  Pulse: 64  SpO2: 97%  Weight: 255 lb (115.7 kg)  Height: 6' (1.829 m)    Orthostatic VS for the past 24 hrs (Last 3 readings):  BP- Lying Pulse- Lying BP- Sitting Pulse- Sitting BP- Standing at 0 minutes Pulse- Standing at 0 minutes BP- Standing at 3 minutes Pulse- Standing at 3 minutes  11/20/20 1206 145/88 79 147/70 87 142/84 96 142/76 88     GEN- The patient is well appearing, alert and oriented x 3 today.   HEENT: normocephalic, atraumatic; sclera clear, conjunctiva pink; hearing intact; oropharynx clear; neck supple  Lungs- Clear to ausculation bilaterally, normal work of breathing.  No wheezes, rales, rhonchi Heart- Regular rate and rhythm, no murmurs, rubs or gallops  GI- soft, non-tender, non-distended, bowel sounds present  Extremities- no clubbing or cyanosis. No edema MS- no significant deformity or atrophy Skin- warm and dry, no rash or lesion; PPM pocket well healed Psych- euthymic mood, full affect Neuro- strength and sensation are intact  PPM Interrogation- personally today. See PACEART report.   EKG:  EKG is not ordered today. The ekg ordered 05/12/2020 shows NSR at 71 bpm  Recent Labs: 05/12/2020: Hemoglobin 14.2; Platelets 335 08/17/2020: BUN 14; Creatinine, Ser 1.16; Potassium 3.7; Sodium 138   Wt Readings from Last 3 Encounters:  11/20/20 255 lb (115.7 kg)  08/18/20 253 lb 8.5 oz  (115 kg)  07/28/20 246 lb (111.6 kg)     Other studies Reviewed: Additional studies/ records that were reviewed today include: Previous EP office notes, pre-op clearance request.   Assessment and Plan:  1. Advanced AV block s/p St. Jude PPM  Normal PPM function  See Claudia Desanctis Art report from 9/22. In office check 04/2020. No changes today.   2. Cardiomyopathy - resolved LV hypertrophy Myoview 05/28/2020 showed LVEF 52%, no ischemic symptoms Encouraged compression hose as needed.   3. HTN Negative orthostatics today as above.  No change in medications for now.   4. Cardiac clearance for R carpal tunnel release The patient is cleared for surgery  from a cardiac perspective with an estimated Low Risk of perioperative cardiac complications by the Revised Cardiac Risk Index Truman Hayward Criteria). The patient may proceed without further cardiac work up.   The patient has an implanted cardiac device and additional clearance will be required through our device clinic due to the potential for interaction with the device during surgery.   Current medicines are reviewed at length with the patient today.   The patient does not have concerns regarding his medicines.  The following changes were made today:  none  Labs/ tests ordered today include:  Recent labwork reviewed  Disposition:   Follow up with Dr. Caryl Comes in 3 months to discuss gen change.     Jacalyn Lefevre, PA-C  11/20/2020 1:52 PM  [place] [address] [phone] (office) [phone] (fax)

## 2020-11-23 ENCOUNTER — Ambulatory Visit (INDEPENDENT_AMBULATORY_CARE_PROVIDER_SITE_OTHER): Payer: BC Managed Care – PPO

## 2020-11-23 DIAGNOSIS — I428 Other cardiomyopathies: Secondary | ICD-10-CM | POA: Diagnosis not present

## 2020-11-24 ENCOUNTER — Encounter: Payer: Self-pay | Admitting: Internal Medicine

## 2020-11-24 NOTE — Progress Notes (Unsigned)
[facility] DEVICE PROGRAMMING   Patient Information: Name: [name] Fields  DOB: 1960-12-30  MRN: [id number]    Planned Procedure:  Right Carpal Tunnel Release Surgeon:  Dr. Daryll Brod Date of Procedure:  12/01/2020   Device Information:   Clinic EP Physician:   Virl Axe, MD Device Type:  Pacemaker Manufacturer and Phone #:  St. Jude/Abbott: [phone] Pacemaker Dependent?:  Yes Date of Last Device Check:  11/20/2020        Normal Device Function?:  Yes     Electrophysiologist's Recommendations:    Have magnet available.  Provide continuous ECG monitoring when magnet is used or reprogramming is to be performed.   Procedure may interfere with device function.  Magnet should be placed over device during procedure.  Per Device Clinic Standing Orders, Simone Curia  11/24/2020 8:45 AM

## 2020-11-25 ENCOUNTER — Encounter (HOSPITAL_BASED_OUTPATIENT_CLINIC_OR_DEPARTMENT_OTHER): Payer: Self-pay | Admitting: Orthopedic Surgery

## 2020-11-25 ENCOUNTER — Other Ambulatory Visit: Payer: Self-pay

## 2020-11-27 ENCOUNTER — Other Ambulatory Visit (HOSPITAL_COMMUNITY)
Admission: RE | Admit: 2020-11-27 | Discharge: 2020-11-27 | Disposition: A | Discharge status: Home / Self Care | Payer: BC Managed Care – PPO | Source: Ambulatory Visit | Attending: Orthopedic Surgery | Admitting: Orthopedic Surgery

## 2020-11-27 ENCOUNTER — Encounter (HOSPITAL_BASED_OUTPATIENT_CLINIC_OR_DEPARTMENT_OTHER)
Admission: RE | Admit: 2020-11-27 | Discharge: 2020-11-27 | Disposition: A | Discharge status: Home / Self Care | Payer: BC Managed Care – PPO | Source: Ambulatory Visit | Attending: Orthopedic Surgery | Admitting: Orthopedic Surgery

## 2020-11-27 DIAGNOSIS — Z01812 Encounter for preprocedural laboratory examination: Secondary | ICD-10-CM | POA: Diagnosis present

## 2020-11-27 DIAGNOSIS — Z20822 Contact with and (suspected) exposure to covid-19: Secondary | ICD-10-CM | POA: Diagnosis not present

## 2020-11-27 LAB — BASIC METABOLIC PANEL
Anion gap: 9 (ref 5–15)
BUN: 9 mg/dL (ref 6–20)
CO2: 26 mmol/L (ref 22–32)
Calcium: 9.8 mg/dL (ref 8.9–10.3)
Chloride: 104 mmol/L (ref 98–111)
Creatinine, Ser: 1.16 mg/dL (ref 0.61–1.24)
GFR, Estimated: >60 mL/min (ref >60)
Glucose, Bld: 103 mg/dL — ABNORMAL HIGH (ref 70–99)
Potassium: 4 mmol/L (ref 3.5–5.1)
Sodium: 139 mmol/L (ref 135–145)

## 2020-11-27 LAB — SARS CORONAVIRUS 2 (TAT 6-24 HRS): SARS Coronavirus 2: NEGATIVE

## 2020-11-27 NOTE — Progress Notes (Signed)

## 2020-11-30 NOTE — Anesthesia Preprocedure Evaluation (Addendum)
Anesthesia Evaluation  Patient identified by MRN, date of birth, ID band Patient awake    Reviewed: Allergy & Precautions, NPO status , Patient's Chart, lab work & pertinent test results  Airway Mallampati: II  TM Distance: >3 FB Neck ROM: Full    Dental  (+) Dental Advisory Given   Pulmonary sleep apnea , Current Smoker and Patient abstained from smoking.,    Pulmonary exam normal breath sounds clear to auscultation       Cardiovascular hypertension, Normal cardiovascular exam+ dysrhythmias + pacemaker  Rhythm:Regular Rate:Normal  NM Stress 04/2020 Nuclear stress EF: 52%. The left ventricular ejection fraction is mildly decreased (45-54%). There was no ST segment deviation noted during stress. The study is normal. This is a low risk study.  Pacer interrogation 81%AS VS   Neuro/Psych    GI/Hepatic hiatal hernia, GERD  ,  Endo/Other    Renal/GU      Musculoskeletal   Abdominal (+) + obese,   Peds  Hematology   Anesthesia Other Findings   Reproductive/Obstetrics                            Anesthesia Physical  Anesthesia Plan  ASA: III  Anesthesia Plan: MAC and Bier Block and Bier Block-LIDOCAINE ONLY   Post-op Pain Management:    Induction: Intravenous  PONV Risk Score and Plan: 0 and Ondansetron, Dexamethasone, Propofol infusion, Midazolam and Treatment may vary due to age or medical condition  Airway Management Planned: Simple Face Mask and Natural Airway  Additional Equipment: None  Intra-op Plan:   Post-operative Plan:   Informed Consent: I have reviewed the patients History and Physical, chart, labs and discussed the procedure including the risks, benefits and alternatives for the proposed anesthesia with the patient or authorized representative who has indicated his/her understanding and acceptance.     Dental advisory given  Plan Discussed with:  CRNA  Anesthesia Plan Comments:        Anesthesia Quick Evaluation

## 2020-12-01 ENCOUNTER — Other Ambulatory Visit: Payer: Self-pay

## 2020-12-01 ENCOUNTER — Encounter (HOSPITAL_BASED_OUTPATIENT_CLINIC_OR_DEPARTMENT_OTHER)
Admission: RE | Disposition: A | Discharge status: Home / Self Care | Payer: Self-pay | Source: Home / Self Care | Attending: Orthopedic Surgery

## 2020-12-01 ENCOUNTER — Ambulatory Visit (HOSPITAL_BASED_OUTPATIENT_CLINIC_OR_DEPARTMENT_OTHER): Payer: BC Managed Care – PPO | Admitting: Anesthesiology

## 2020-12-01 ENCOUNTER — Ambulatory Visit (HOSPITAL_BASED_OUTPATIENT_CLINIC_OR_DEPARTMENT_OTHER)
Admission: RE | Admit: 2020-12-01 | Discharge: 2020-12-01 | Disposition: A | Discharge status: Home / Self Care | Payer: BC Managed Care – PPO | Attending: Orthopedic Surgery | Admitting: Orthopedic Surgery

## 2020-12-01 ENCOUNTER — Encounter (HOSPITAL_BASED_OUTPATIENT_CLINIC_OR_DEPARTMENT_OTHER): Payer: Self-pay | Admitting: Orthopedic Surgery

## 2020-12-01 DIAGNOSIS — I428 Other cardiomyopathies: Secondary | ICD-10-CM | POA: Diagnosis not present

## 2020-12-01 DIAGNOSIS — Z981 Arthrodesis status: Secondary | ICD-10-CM | POA: Diagnosis not present

## 2020-12-01 DIAGNOSIS — Z888 Allergy status to other drugs, medicaments and biological substances status: Secondary | ICD-10-CM | POA: Diagnosis not present

## 2020-12-01 DIAGNOSIS — Z95810 Presence of automatic (implantable) cardiac defibrillator: Secondary | ICD-10-CM | POA: Insufficient documentation

## 2020-12-01 DIAGNOSIS — Z8249 Family history of ischemic heart disease and other diseases of the circulatory system: Secondary | ICD-10-CM | POA: Insufficient documentation

## 2020-12-01 DIAGNOSIS — G5601 Carpal tunnel syndrome, right upper limb: Secondary | ICD-10-CM | POA: Insufficient documentation

## 2020-12-01 DIAGNOSIS — F17200 Nicotine dependence, unspecified, uncomplicated: Secondary | ICD-10-CM | POA: Insufficient documentation

## 2020-12-01 DIAGNOSIS — I10 Essential (primary) hypertension: Secondary | ICD-10-CM | POA: Insufficient documentation

## 2020-12-01 HISTORY — PX: CARPAL TUNNEL RELEASE: SHX101

## 2020-12-01 HISTORY — DX: Sleep apnea, unspecified: G47.30

## 2020-12-01 HISTORY — DX: Gastro-esophageal reflux disease without esophagitis: K21.9

## 2020-12-01 SURGERY — CARPAL TUNNEL RELEASE
Anesthesia: Monitor Anesthesia Care | Site: Wrist | Laterality: Right

## 2020-12-01 MED ORDER — CEFAZOLIN SODIUM-DEXTROSE 2-4 GM/100ML-% IV SOLN
INTRAVENOUS | Status: AC
  Filled 2020-12-01: qty 100

## 2020-12-01 MED ORDER — MIDAZOLAM HCL 2 MG/2ML IJ SOLN
INTRAMUSCULAR | Status: AC
  Filled 2020-12-01: qty 2

## 2020-12-01 MED ORDER — LACTATED RINGERS IV SOLN: INTRAVENOUS | Status: DC

## 2020-12-01 MED ORDER — BUPIVACAINE HCL (PF) 0.25 % IJ SOLN
INTRAMUSCULAR | Status: AC
  Filled 2020-12-01: qty 30

## 2020-12-01 MED ORDER — FENTANYL CITRATE (PF) 100 MCG/2ML IJ SOLN: 25.0000 ug | INTRAMUSCULAR | Status: DC | PRN

## 2020-12-01 MED ORDER — MEPERIDINE HCL 25 MG/ML IJ SOLN: 6.2500 mg | INTRAMUSCULAR | Status: DC | PRN

## 2020-12-01 MED ORDER — LIDOCAINE HCL (PF) 0.5 % IJ SOLN
INTRAMUSCULAR | Status: DC | PRN
  Administered 2020-12-01: 30 mL via INTRAVENOUS

## 2020-12-01 MED ORDER — PROPOFOL 500 MG/50ML IV EMUL
INTRAVENOUS | Status: AC
  Filled 2020-12-01: qty 50

## 2020-12-01 MED ORDER — MIDAZOLAM HCL 5 MG/5ML IJ SOLN
INTRAMUSCULAR | Status: DC | PRN
  Administered 2020-12-01: 2 mg via INTRAVENOUS

## 2020-12-01 MED ORDER — FENTANYL CITRATE (PF) 100 MCG/2ML IJ SOLN
INTRAMUSCULAR | Status: DC | PRN
  Administered 2020-12-01: 100 ug via INTRAVENOUS

## 2020-12-01 MED ORDER — PROPOFOL 500 MG/50ML IV EMUL
INTRAVENOUS | Status: DC | PRN
  Administered 2020-12-01: 50 ug/kg/min via INTRAVENOUS

## 2020-12-01 MED ORDER — PROPOFOL 10 MG/ML IV BOLUS
INTRAVENOUS | Status: DC | PRN
  Administered 2020-12-01: 20 mg via INTRAVENOUS

## 2020-12-01 MED ORDER — PROMETHAZINE HCL 25 MG/ML IJ SOLN: 6.2500 mg | INTRAMUSCULAR | Status: DC | PRN

## 2020-12-01 MED ORDER — CEFAZOLIN SODIUM-DEXTROSE 2-4 GM/100ML-% IV SOLN
2.0000 g | INTRAVENOUS | Status: AC
  Administered 2020-12-01: 2 g via INTRAVENOUS

## 2020-12-01 MED ORDER — BUPIVACAINE HCL (PF) 0.25 % IJ SOLN
INTRAMUSCULAR | Status: DC | PRN
  Administered 2020-12-01: 10 mL

## 2020-12-01 MED ORDER — ONDANSETRON HCL 4 MG/2ML IJ SOLN
INTRAMUSCULAR | Status: DC | PRN
  Administered 2020-12-01: 4 mg via INTRAVENOUS

## 2020-12-01 MED ORDER — FENTANYL CITRATE (PF) 100 MCG/2ML IJ SOLN
INTRAMUSCULAR | Status: AC
  Filled 2020-12-01: qty 2

## 2020-12-01 SURGICAL SUPPLY — 36 items
APL PRP STRL LF DISP 70% ISPRP (MISCELLANEOUS) ×1
BLADE SURG 15 STRL LF DISP TIS (BLADE) ×1 IMPLANT
BLADE SURG 15 STRL SS (BLADE) ×2
BNDG CMPR 9X4 STRL LF SNTH (GAUZE/BANDAGES/DRESSINGS)
BNDG COHESIVE 3X5 TAN STRL LF (GAUZE/BANDAGES/DRESSINGS) ×2 IMPLANT
BNDG ESMARK 4X9 LF (GAUZE/BANDAGES/DRESSINGS) IMPLANT
BNDG GAUZE ELAST 4 BULKY (GAUZE/BANDAGES/DRESSINGS) ×2 IMPLANT
CHLORAPREP W/TINT 26 (MISCELLANEOUS) ×2 IMPLANT
CORD BIPOLAR FORCEPS 12FT (ELECTRODE) ×2 IMPLANT
COVER BACK TABLE 60X90IN (DRAPES) ×2 IMPLANT
COVER MAYO STAND STRL (DRAPES) ×2 IMPLANT
COVER WAND RF STERILE (DRAPES) IMPLANT
CUFF TOURN SGL QUICK 18X4 (TOURNIQUET CUFF) ×2 IMPLANT
DRAPE EXTREMITY T 121X128X90 (DISPOSABLE) ×2 IMPLANT
DRAPE SURG 17X23 STRL (DRAPES) ×2 IMPLANT
DRSG PAD ABDOMINAL 8X10 ST (GAUZE/BANDAGES/DRESSINGS) ×2 IMPLANT
GAUZE SPONGE 4X4 12PLY STRL (GAUZE/BANDAGES/DRESSINGS) ×2 IMPLANT
GAUZE XEROFORM 1X8 LF (GAUZE/BANDAGES/DRESSINGS) ×2 IMPLANT
GLOVE BIOGEL PI IND STRL 8.5 (GLOVE) ×1 IMPLANT
GLOVE BIOGEL PI INDICATOR 8.5 (GLOVE) ×1
GLOVE SURG ENC MOIS LTX SZ6.5 (GLOVE) ×1 IMPLANT
GLOVE SURG ORTHO LTX SZ8 (GLOVE) ×2 IMPLANT
GOWN STRL REUS W/ TWL LRG LVL3 (GOWN DISPOSABLE) ×1 IMPLANT
GOWN STRL REUS W/TWL LRG LVL3 (GOWN DISPOSABLE) ×2
GOWN STRL REUS W/TWL XL LVL3 (GOWN DISPOSABLE) ×2 IMPLANT
NDL PRECISIONGLIDE 27X1.5 (NEEDLE) IMPLANT
NEEDLE PRECISIONGLIDE 27X1.5 (NEEDLE) IMPLANT
NS IRRIG 1000ML POUR BTL (IV SOLUTION) ×2 IMPLANT
PACK BASIN DAY SURGERY FS (CUSTOM PROCEDURE TRAY) ×2 IMPLANT
STOCKINETTE 4X48 STRL (DRAPES) ×2 IMPLANT
SUT ETHILON 4 0 PS 2 18 (SUTURE) ×2 IMPLANT
SUT VICRYL 4-0 PS2 18IN ABS (SUTURE) IMPLANT
SYR BULB EAR ULCER 3OZ GRN STR (SYRINGE) ×2 IMPLANT
SYR CONTROL 10ML LL (SYRINGE) IMPLANT
TOWEL GREEN STERILE FF (TOWEL DISPOSABLE) ×2 IMPLANT
UNDERPAD 30X36 HEAVY ABSORB (UNDERPADS AND DIAPERS) ×2 IMPLANT
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
## 2020-12-01 NOTE — Transfer of Care (Signed)
Immediate Anesthesia Transfer of Care Note  Patient: [name] Fields  Procedure(s) Performed: CARPAL TUNNEL RELEASE (Right Wrist)  Patient Location: PACU  Anesthesia Type:MAC and Bier block  Level of Consciousness: awake, alert  and oriented  Airway & Oxygen Therapy: Patient Spontanous Breathing and Patient connected to face mask oxygen  Post-op Assessment: Report given to RN and Post -op Vital signs reviewed and stable  Post vital signs: Reviewed and stable  Last Vitals:  Vitals Value Taken Time  BP 111/66 12/01/20 0926  Temp    Pulse 66 12/01/20 0928  Resp 16 12/01/20 0928  SpO2 95 % 12/01/20 0928  Vitals shown include unvalidated device data.  Last Pain:  Vitals:   12/01/20 0809  TempSrc: Oral  PainSc: 2       Patients Stated Pain Goal: 3 (23/95/32 0233)  Complications: No complications documented.

## 2020-12-01 NOTE — Discharge Instructions (Addendum)

## 2020-12-01 NOTE — Brief Op Note (Signed)
12/01/2020  9:25 AM  PATIENT:  [name] Fields  60 y.o. male  PRE-OPERATIVE DIAGNOSIS:  RIGHT CARPAL TUNNEL SYNDROME  POST-OPERATIVE DIAGNOSIS:  RIGHT CARPAL TUNNEL SYNDROME  PROCEDURE:  Procedure(s) with comments: CARPAL TUNNEL RELEASE (Right) - IV REGIONAL FOREARM BLOCK  SURGEON:  Surgeon(s) and Role:    * Daryll Brod, MD - Primary  PHYSICIAN ASSISTANT:   ASSISTANTS: none   ANESTHESIA:   local, regional and IV sedation  EBL:  5ml  BLOOD ADMINISTERED:none  DRAINS: none   LOCAL MEDICATIONS USED:  BUPIVICAINE   SPECIMEN:  No Specimen  DISPOSITION OF SPECIMEN:  N/A  COUNTS:  YES  TOURNIQUET:   Total Tourniquet Time Documented: Forearm (Right) - 29 minutes Total: Forearm (Right) - 29 minutes   DICTATION: .Viviann Spare Dictation  PLAN OF CARE: Discharge to home after PACU  PATIENT DISPOSITION:  PACU - hemodynamically stable.

## 2020-12-01 NOTE — Anesthesia Procedure Notes (Addendum)
Anesthesia Regional Block: Bier block (IV Regional)   Pre-Anesthetic Checklist: ,, timeout performed, Correct Patient, Correct Site, Correct Laterality, Correct Procedure,, site marked, surgical consent,, at surgeon's request  Laterality: Right     Needles:  Injection technique: Single-shot  Needle Type: Other      Needle Gauge: 22     Additional Needles:   Procedures:,,,,, intact distal pulses, Esmarch exsanguination, single tourniquet utilized,  Narrative:  Start time: 12/01/2020 8:49 AM End time: 12/01/2020 8:49 AM  Performed by: Personally

## 2020-12-01 NOTE — Op Note (Signed)
NAME: [name] Fields RECORD NO: [id number] DATE OF BIRTH: 02-24-1961 FACILITY[facility] LOCATION: [facility] PHYSICIAN: Wynonia Sours, MD   OPERATIVE REPORT   DATE OF PROCEDURE: 12/01/20    PREOPERATIVE DIAGNOSIS:   Carpal tunnel syndrome right   POSTOPERATIVE DIAGNOSIS:   Same   PROCEDURE:   Decompression median nerve right hand   SURGEON: Daryll Brod, M.D.   ASSISTANT: none   ANESTHESIA:  Bier block with sedation and Local   INTRAVENOUS FLUIDS:  Per anesthesia flow sheet.   ESTIMATED BLOOD LOSS:  Minimal.   COMPLICATIONS:  None.   SPECIMENS:  none   TOURNIQUET TIME:    Total Tourniquet Time Documented: Forearm (Right) - 29 minutes Total: Forearm (Right) - 29 minutes    DISPOSITION:  Stable to PACU.   INDICATIONS: Patient is a 60 year old male with history of bilateral carpal tunnel syndrome nerve conductions positive. Is undergone release of the left side is admitted now for release of the right.. Preperi-and postoperative course been discussed along with risks and complications. He is aware there is there is no guarantee to the surgery the possibility of infection recurrence injury to arteries nerves tendons incomplete relief symptoms dystrophy. Either he is advised that we are attempting to halt the process get given the nerve the opportunity to improve but cannot guarantee it. Preoperative area the patient is seen extremity marked by both patient and surgeon antibiotic given  OPERATIVE COURSE: Patient is brought to the operating room placed in the supine position with the right arm free. A forearm IV regional anesthetic was carried out without difficulty under the direction of the anesthesia department. He was prepped using ChloraPrep. A 3-minute dry time was allowed and timeout taken to confirm patient procedure. A longitudinal incision was made the right palm carried down through subcutaneous tissue. Bleeders were electrocauterized with bipolar.  The palmar fascia was split. Superficial palmar arch was identified along with the flexor tendon to the ring little finger. A sectseeretractor was then used to retract the median nerve and flexor tendons radially on the ulnar nerve ulnarly. The flexor retinaculum was then released on its ulnar border. The superficial subcutaneous tissue skin was dissected free from the distal forearm fascia the deep structures were also dissected free with blunt dissection from the forearm fascia proximally. A sewell and sectsee retractor were then placed between skin and forearm fashion and the proximal aspect of the flexor retinaculum distal forearm fascia was then released for approximately 2 to 3 cm under direct vision. The canal was explored. Very significant hyperemia was present in the median nerve. Motor branch had in the muscle distally. The wound was copiously irrigated with saline. The skin was closed with interrupted 4-0 nylon sutures. Local infiltration with quarter percent bupivacaine without epinephrine was good approximately 10 cc was used sterile compressive dressing with fingers 3 was applied. And deflation of the tourniquet all fingers immediately pink. He was taken to the recovery room for observation in satisfactory condition. He will be discharged home to return to the hand center [facility] in 1 week on Tylenol ibuprofen for pain with Ultram for breakthrough.   Daryll Brod, MD Electronically signed, 12/01/20

## 2020-12-02 ENCOUNTER — Encounter (HOSPITAL_BASED_OUTPATIENT_CLINIC_OR_DEPARTMENT_OTHER): Payer: Self-pay | Admitting: Orthopedic Surgery

## 2020-12-02 NOTE — Anesthesia Postprocedure Evaluation (Signed)
Anesthesia Post Note  Patient: [name] Fields  Procedure(s) Performed: CARPAL TUNNEL RELEASE (Right Wrist)     Patient location during evaluation: PACU Anesthesia Type: MAC Level of consciousness: awake and alert Pain management: pain level controlled Vital Signs Assessment: post-procedure vital signs reviewed and stable Respiratory status: spontaneous breathing Cardiovascular status: stable Anesthetic complications: no   No complications documented.  Last Vitals:  Vitals:   12/01/20 0945 12/01/20 1009  BP: 121/77 118/77  Pulse: (!) 59 63  Resp: 12 16  Temp:  36.7 C  SpO2: 97% 95%    Last Pain:  Vitals:   12/01/20 1009  TempSrc:   PainSc: 0-No pain                 Nolon Nations

## 2020-12-04 NOTE — Progress Notes (Signed)
Remote pacemaker transmission.   

## 2021-01-18 ENCOUNTER — Telehealth: Payer: Self-pay

## 2021-01-18 NOTE — Telephone Encounter (Signed)
The pt wanted help sending a transmission. Transmission received. I let him know he should leave the monitor plug in at all times. He wanted to know his results. I let him speak with Amy, rn.

## 2021-01-18 NOTE — Telephone Encounter (Signed)
Advised pt current transmission shows <33months remaining until ERI.  Explained what ERI means and that when it is reached he will still have ~3 months remaining on battery.  Continue with monthly battery checks.

## 2021-01-25 ENCOUNTER — Ambulatory Visit (INDEPENDENT_AMBULATORY_CARE_PROVIDER_SITE_OTHER): Payer: BC Managed Care – PPO

## 2021-01-25 DIAGNOSIS — I441 Atrioventricular block, second degree: Secondary | ICD-10-CM

## 2021-01-26 LAB — CUP PACEART REMOTE DEVICE CHECK
Battery Remaining Longevity: 1 mo
Battery Remaining Percentage: 0.5 %
Battery Voltage: 2.6 V
Brady Statistic AP VP Percent: 1.5 %
Brady Statistic AP VS Percent: 2.6 %
Brady Statistic AS VP Percent: 10 %
Brady Statistic AS VS Percent: 85 %
Brady Statistic RA Percent Paced: 3.2 %
Brady Statistic RV Percent Paced: 12 %
Date Time Interrogation Session: 20220328115907
Implantable Lead Implant Date: 19990719
Implantable Lead Implant Date: 19990719
Implantable Lead Location: 753859
Implantable Lead Location: 753860
Implantable Pulse Generator Implant Date: 20110711
Lead Channel Impedance Value: 340 Ohm
Lead Channel Impedance Value: 340 Ohm
Lead Channel Pacing Threshold Amplitude: 0.5 V
Lead Channel Pacing Threshold Amplitude: 0.5 V
Lead Channel Pacing Threshold Pulse Width: 0.5 ms
Lead Channel Pacing Threshold Pulse Width: 0.7 ms
Lead Channel Sensing Intrinsic Amplitude: 2.6 mV
Lead Channel Sensing Intrinsic Amplitude: 9.4 mV
Lead Channel Setting Pacing Amplitude: 2 V
Lead Channel Setting Pacing Amplitude: 2.5 V
Lead Channel Setting Pacing Pulse Width: 0.5 ms
Lead Channel Setting Sensing Sensitivity: 2 mV
Pulse Gen Model: 2210
Pulse Gen Serial Number: 7152165

## 2021-02-04 NOTE — Addendum Note (Signed)
Addended by: Cheri Kearns A on: 02/04/2021 10:31 AM   Modules accepted: Level of Service

## 2021-02-04 NOTE — Progress Notes (Signed)
Remote pacemaker transmission.   

## 2021-02-25 ENCOUNTER — Ambulatory Visit (INDEPENDENT_AMBULATORY_CARE_PROVIDER_SITE_OTHER): Payer: BC Managed Care – PPO

## 2021-02-25 DIAGNOSIS — I429 Cardiomyopathy, unspecified: Secondary | ICD-10-CM

## 2021-03-01 LAB — CUP PACEART REMOTE DEVICE CHECK
Battery Remaining Longevity: 1 mo
Battery Remaining Percentage: 0.5 %
Battery Voltage: 2.59 V
Brady Statistic AP VP Percent: 1.2 %
Brady Statistic AP VS Percent: 2.2 %
Brady Statistic AS VP Percent: 9.1 %
Brady Statistic AS VS Percent: 87 %
Brady Statistic RA Percent Paced: 2.6 %
Brady Statistic RV Percent Paced: 10 %
Date Time Interrogation Session: 20220428040915
Implantable Lead Implant Date: 19990719
Implantable Lead Implant Date: 19990719
Implantable Lead Location: 753859
Implantable Lead Location: 753860
Implantable Pulse Generator Implant Date: 20110711
Lead Channel Impedance Value: 360 Ohm
Lead Channel Impedance Value: 360 Ohm
Lead Channel Pacing Threshold Amplitude: 0.5 V
Lead Channel Pacing Threshold Amplitude: 0.5 V
Lead Channel Pacing Threshold Pulse Width: 0.5 ms
Lead Channel Pacing Threshold Pulse Width: 0.7 ms
Lead Channel Sensing Intrinsic Amplitude: 4.9 mV
Lead Channel Sensing Intrinsic Amplitude: 9.5 mV
Lead Channel Setting Pacing Amplitude: 2 V
Lead Channel Setting Pacing Amplitude: 2.5 V
Lead Channel Setting Pacing Pulse Width: 0.5 ms
Lead Channel Setting Sensing Sensitivity: 2 mV
Pulse Gen Model: 2210
Pulse Gen Serial Number: 7152165

## 2021-03-10 ENCOUNTER — Encounter: Payer: Self-pay | Admitting: Internal Medicine

## 2021-03-10 ENCOUNTER — Ambulatory Visit: Payer: BC Managed Care – PPO | Admitting: Internal Medicine

## 2021-03-10 ENCOUNTER — Other Ambulatory Visit: Payer: Self-pay

## 2021-03-10 VITALS — BP 132/70 | HR 77 | Ht 72.0 in | Wt 259.4 lb

## 2021-03-10 DIAGNOSIS — Z950 Presence of cardiac pacemaker: Secondary | ICD-10-CM

## 2021-03-10 DIAGNOSIS — R0600 Dyspnea, unspecified: Secondary | ICD-10-CM | POA: Diagnosis not present

## 2021-03-10 DIAGNOSIS — I429 Cardiomyopathy, unspecified: Secondary | ICD-10-CM | POA: Diagnosis not present

## 2021-03-10 DIAGNOSIS — I441 Atrioventricular block, second degree: Secondary | ICD-10-CM

## 2021-03-10 NOTE — Patient Instructions (Addendum)
Medication Instructions:  Your physician recommends that you continue on your current medications as directed. Please refer to the Current Medication list given to you today. *If you need a refill on your cardiac medications before your next appointment, please call your pharmacy*   Lab Work: None ordered.  If you have labs (blood work) drawn today and your tests are completely normal, you will receive your results only by: Marland Kitchen MyChart Message (if you have MyChart) OR . A paper copy in the mail If you have any lab test that is abnormal or we need to change your treatment, we will call you to review the results.   Testing/Procedures: Your physician has requested that you have an echocardiogram. Echocardiography is a painless test that uses sound waves to create images of your heart. It provides your doctor with information about the size and shape of your heart and how well your heart's chambers and valves are working. This procedure takes approximately one hour. There are no restrictions for this procedure.    Follow-Up: At [facility], you and your health needs are our priority.  As part of our continuing mission to provide you with exceptional heart care, we have created designated Provider Care Teams.  These Care Teams include your primary Cardiologist (physician) and Advanced Practice Providers (APPs -  Physician Assistants and Nurse Practitioners) who all work together to provide you with the care you need, when you need it.  We recommend signing up for the patient portal called "MyChart".  Sign up information is provided on this After Visit Summary.  MyChart is used to connect with patients for Virtual Visits (Telemedicine).  Patients are able to view lab/test results, encounter notes, upcoming appointments, etc.  Non-urgent messages can be sent to your provider as well.   To learn more about what you can do with MyChart, go to [email or web address].    Your next appointment:    Monthly remote checks until device reaches ERI at which time you will be notified and scheduled for a generator change.   Other Instructions Please purchase an [employer]

## 2021-03-10 NOTE — Progress Notes (Signed)
Patient Care Team: Patient, No Pcp Per (Inactive) as PCP - General (General Practice) [name], MD as PCP - Sleep Medicine (Cardiology) [name], MD as PCP - Electrophysiology (Cardiology) [name], MD as Attending Physician (Cardiology)   HPI  [name] Fields is a 60 y.o. male Seen in followup for pacemaker implanted 1999 for high-grade heart block for which he underwent generator replacement in July 2011.  Vp 19 % >>Vp 9%    Patient has dyspnea on exertion.  Gradually getting worse.  No edema.  No nocturnal dyspnea but does snore.  Has untreated sleep apnea.  Has CPAP at home.  No chest pain  Episode of presyncope while eating dinner the other day.  Got pale.  Diaphoretic.  Orthostatic intolerance.  Some nausea.  Diet is replete of sodium.       DATE TEST EF   10/14 Echo  60-65%   7/21 Myoview 52% No ischemia       Date Cr K Hgb  7/16 1.04 3.9    5/19 1.26 4.6    1/22 1.16 4.0 14.0 (7/21)     Past Medical History:  Diagnosis Date  . Allergy to ACE inhibitors   . Atrial tachycardia ([place])   . Cardiomyopathy, nonischemic ([place])   . GERD (gastroesophageal reflux disease)   . Hiatal hernia   . High-grade second-degree heart block     Qualifier: Diagnosis of  By: Caryl Comes, MD, Remus Blake   . Hypertension   . Permanent pacemaker-St. Jude     DOI 1999; gen re in the summer he has o pain syndrome BP 135% at time long so is his we have been short of his device is in a in a in a Foley disease since the stents removed about 3.0-4 seconds on the episode the patient is very 43 and a [place] when his pacemaker but when he and his placement 2011  . Presence of permanent cardiac pacemaker   . Sleep apnea    uses CPAP sometimes  . WPW (Wolff-Parkinson-White syndrome)    s/pRFCA    Past Surgical History:  Procedure Laterality Date  . CARDIAC DEFIBRILLATOR PLACEMENT  2011   St Jude -- Accent RF  . CARPAL TUNNEL RELEASE Left 08/18/2020    Procedure: CARPAL TUNNEL RELEASE;  Surgeon: Daryll Brod, MD;  Location: [place];  Service: Orthopedics;  Laterality: Left;  IV REGIONAL FOREARM BLOCK  . CARPAL TUNNEL RELEASE Right 12/01/2020   Procedure: CARPAL TUNNEL RELEASE;  Surgeon: Daryll Brod, MD;  Location: [place];  Service: Orthopedics;  Laterality: Right;  IV REGIONAL FOREARM BLOCK  . PACEMAKER INSERTION  1999   explanted 2011    Current Outpatient Medications  Medication Sig Dispense Refill  . Ascorbic Acid (VITAMIN C) 1000 MG tablet Take 1,000 mg by mouth daily.    . cholecalciferol (VITAMIN D3) 25 MCG (1000 UNIT) tablet Take 2,000 Units by mouth daily.    . fluticasone (FLONASE) 50 MCG/ACT nasal spray Place into both nostrils as needed for allergies or rhinitis.    . Multiple Vitamins-Minerals (MULTIVITAMIN WITH MINERALS) tablet Take 1 tablet by mouth daily.    Marland Kitchen omeprazole (PRILOSEC) 40 MG capsule TAKE 1 CAPSULE BY MOUTH ONCE DAILY BEFORE  DINNER 30 capsule 0  . traMADol (ULTRAM) 50 MG tablet Take 1 tablet (50 mg total) by mouth every 6 (six) hours as needed. 20 tablet 0  . hydrochlorothiazide (HYDRODIURIL) 25 MG tablet  Take 1 tablet (25 mg total) by mouth daily. 90 tablet 2   No current facility-administered medications for this visit.    Allergies  Allergen Reactions  . Lisinopril Anaphylaxis    Lip swelling    Review of Systems negative except from HPI and PMH  Physical Exam BP 132/70   Pulse 77   Ht 6' (1.829 m)   Wt 259 lb 6.4 oz (117.7 kg)   SpO2 98%   BMI 35.18 kg/m  Well developed and well nourished in no acute distress HENT normal Neck supple with JVP-7 cm Clear Device pocket well healed; without hematoma or erythema.  There is no tethering  Regular rate and rhythm, n murmur Abd-soft with active BS No Clubbing cyanosis  edema Skin-warm and dry A & Oriented  Grossly normal sensory and motor function  ECG sinus at 77 Intervals 18/12/35 Axis left -50 T wave  inversions 2 3 FT 5-V6 unchanged    Assessment and  Plan  2AVBlock intermittent  Pacemaker St Jude  DEvice approaching ERI                                                                                                                                                                                                                                      Cardiomyopathy-resolved-- LV hypertrophy  Hypertension -well-controlled  Dyspnea on exertion  Presyncope  Abnormal ECG   Dyspnea continues to be a concern.  We will reassess LV function with an echo.  Myoview scanning was unrevealing for ischemia.  His diet is excessively loaded with sodium.  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  Discussed the importance of trying to salt restrict.  Encouraged him to treat his sleep apnea.  Device is approaching ERI.  He will need device generator replacement.  Discussed device generator replacement   Presyncopal episode suggests a neurally mediated episode based on the epiphenomenon.  His ventricular pacing burden of 11% is inconsistent with his original heart block being solely related to vasomotor episodes. I suspect he probably has both.   He has an atrial tachycardia, we will plan to use an AliveCor to try to elucidate the mechanism of these episodes.

## 2021-03-12 ENCOUNTER — Other Ambulatory Visit: Payer: Self-pay

## 2021-03-12 MED ORDER — HYDROCHLOROTHIAZIDE 25 MG PO TABS: 25.0000 mg | ORAL_TABLET | Freq: Every day | ORAL | 3 refills | Status: DC

## 2021-03-17 NOTE — Progress Notes (Signed)
Remote pacemaker transmission.   

## 2021-03-17 NOTE — Addendum Note (Signed)
Addended by: Cheri Kearns A on: 03/17/2021 03:22 PM   Modules accepted: Level of Service

## 2021-03-23 ENCOUNTER — Ambulatory Visit (HOSPITAL_COMMUNITY): Payer: BC Managed Care – PPO

## 2021-03-29 LAB — CUP PACEART REMOTE DEVICE CHECK
Battery Remaining Longevity: 1 mo
Battery Remaining Percentage: 0.5 %
Battery Voltage: 2.59 V
Brady Statistic AP VP Percent: 1 %
Brady Statistic AP VS Percent: 1 %
Brady Statistic AS VP Percent: 19 %
Brady Statistic AS VS Percent: 79 %
Brady Statistic RA Percent Paced: 1.8 %
Brady Statistic RV Percent Paced: 20 %
Date Time Interrogation Session: 20220529182000
Implantable Lead Implant Date: 19990719
Implantable Lead Implant Date: 19990719
Implantable Lead Location: 753859
Implantable Lead Location: 753860
Implantable Pulse Generator Implant Date: 20110711
Lead Channel Impedance Value: 350 Ohm
Lead Channel Impedance Value: 360 Ohm
Lead Channel Pacing Threshold Amplitude: 0.5 V
Lead Channel Pacing Threshold Amplitude: 0.75 V
Lead Channel Pacing Threshold Pulse Width: 0.5 ms
Lead Channel Pacing Threshold Pulse Width: 0.7 ms
Lead Channel Sensing Intrinsic Amplitude: 3.9 mV
Lead Channel Sensing Intrinsic Amplitude: 9.1 mV
Lead Channel Setting Pacing Amplitude: 2 V
Lead Channel Setting Pacing Amplitude: 2.5 V
Lead Channel Setting Pacing Pulse Width: 0.5 ms
Lead Channel Setting Sensing Sensitivity: 2 mV
Pulse Gen Model: 2210
Pulse Gen Serial Number: 7152165

## 2021-03-31 ENCOUNTER — Ambulatory Visit (INDEPENDENT_AMBULATORY_CARE_PROVIDER_SITE_OTHER): Payer: BC Managed Care – PPO

## 2021-03-31 DIAGNOSIS — I441 Atrioventricular block, second degree: Secondary | ICD-10-CM

## 2021-03-31 LAB — CUP PACEART REMOTE DEVICE CHECK
Battery Remaining Longevity: 1 mo
Battery Remaining Percentage: 0.5 %
Battery Voltage: 2.59 V
Brady Statistic AP VP Percent: 1.3 %
Brady Statistic AP VS Percent: 1 %
Brady Statistic AS VP Percent: 20 %
Brady Statistic AS VS Percent: 78 %
Brady Statistic RA Percent Paced: 2 %
Brady Statistic RV Percent Paced: 22 %
Date Time Interrogation Session: 20220601071436
Implantable Lead Implant Date: 19990719
Implantable Lead Implant Date: 19990719
Implantable Lead Location: 753859
Implantable Lead Location: 753860
Implantable Pulse Generator Implant Date: 20110711
Lead Channel Impedance Value: 340 Ohm
Lead Channel Impedance Value: 360 Ohm
Lead Channel Pacing Threshold Amplitude: 0.5 V
Lead Channel Pacing Threshold Amplitude: 0.75 V
Lead Channel Pacing Threshold Pulse Width: 0.5 ms
Lead Channel Pacing Threshold Pulse Width: 0.7 ms
Lead Channel Sensing Intrinsic Amplitude: 3.6 mV
Lead Channel Sensing Intrinsic Amplitude: 9.7 mV
Lead Channel Setting Pacing Amplitude: 2 V
Lead Channel Setting Pacing Amplitude: 2.5 V
Lead Channel Setting Pacing Pulse Width: 0.5 ms
Lead Channel Setting Sensing Sensitivity: 2 mV
Pulse Gen Model: 2210
Pulse Gen Serial Number: 7152165

## 2021-04-01 ENCOUNTER — Other Ambulatory Visit: Payer: Self-pay

## 2021-04-01 ENCOUNTER — Ambulatory Visit (HOSPITAL_COMMUNITY)
Admission: RE | Admit: 2021-04-01 | Discharge: 2021-04-01 | Disposition: A | Discharge status: Home / Self Care | Payer: BC Managed Care – PPO | Source: Ambulatory Visit | Attending: Internal Medicine | Admitting: Internal Medicine

## 2021-04-01 DIAGNOSIS — Z95810 Presence of automatic (implantable) cardiac defibrillator: Secondary | ICD-10-CM | POA: Insufficient documentation

## 2021-04-01 DIAGNOSIS — R0600 Dyspnea, unspecified: Secondary | ICD-10-CM | POA: Diagnosis present

## 2021-04-01 DIAGNOSIS — I358 Other nonrheumatic aortic valve disorders: Secondary | ICD-10-CM | POA: Insufficient documentation

## 2021-04-01 DIAGNOSIS — I428 Other cardiomyopathies: Secondary | ICD-10-CM

## 2021-04-01 DIAGNOSIS — R0609 Other forms of dyspnea: Secondary | ICD-10-CM

## 2021-04-01 DIAGNOSIS — I429 Cardiomyopathy, unspecified: Secondary | ICD-10-CM

## 2021-04-01 DIAGNOSIS — I10 Essential (primary) hypertension: Secondary | ICD-10-CM | POA: Diagnosis not present

## 2021-04-01 LAB — ECHOCARDIOGRAM COMPLETE
Area-P 1/2: 3.23 cm2
S' Lateral: 3.7 cm

## 2021-04-01 NOTE — Progress Notes (Signed)
  Echocardiogram 2D Echocardiogram has been performed.  [name] Fields 04/01/2021, 10:00 AM

## 2021-04-21 ENCOUNTER — Telehealth: Payer: Self-pay | Admitting: Internal Medicine

## 2021-04-21 NOTE — Telephone Encounter (Signed)
Patient called and asked for itemized bills for all of his services dated 08/12/19 to the present. He needs them for his attorney.  The patient would like to pick them up at the office Friday when he is back in town.   If he can not pick them up, he would like them emailed to him at [email or web address]

## 2021-04-21 NOTE — Telephone Encounter (Signed)
Spoke with pt and gave him the billing telephone number to call for an itemized statement to be sent to him

## 2021-04-22 NOTE — Progress Notes (Signed)
Remote pacemaker transmission.   

## 2021-04-22 NOTE — Addendum Note (Signed)
Addended by: Cheri Kearns A on: 04/22/2021 03:58 PM   Modules accepted: Level of Service

## 2021-04-29 ENCOUNTER — Telehealth: Payer: Self-pay

## 2021-04-29 LAB — CUP PACEART REMOTE DEVICE CHECK
Battery Remaining Longevity: 1 mo
Battery Remaining Percentage: 0.5 %
Battery Voltage: 2.59 V
Brady Statistic AP VP Percent: 1.4 %
Brady Statistic AP VS Percent: 1 %
Brady Statistic AS VP Percent: 15 %
Brady Statistic AS VS Percent: 83 %
Brady Statistic RA Percent Paced: 1.8 %
Brady Statistic RV Percent Paced: 17 %
Date Time Interrogation Session: 20220630023023
Implantable Lead Implant Date: 19990719
Implantable Lead Implant Date: 19990719
Implantable Lead Location: 753859
Implantable Lead Location: 753860
Implantable Pulse Generator Implant Date: 20110711
Lead Channel Impedance Value: 330 Ohm
Lead Channel Impedance Value: 340 Ohm
Lead Channel Pacing Threshold Amplitude: 0.5 V
Lead Channel Pacing Threshold Amplitude: 0.75 V
Lead Channel Pacing Threshold Pulse Width: 0.5 ms
Lead Channel Pacing Threshold Pulse Width: 0.7 ms
Lead Channel Sensing Intrinsic Amplitude: 3.1 mV
Lead Channel Sensing Intrinsic Amplitude: 7.7 mV
Lead Channel Setting Pacing Amplitude: 2 V
Lead Channel Setting Pacing Amplitude: 2.5 V
Lead Channel Setting Pacing Pulse Width: 0.5 ms
Lead Channel Setting Sensing Sensitivity: 2 mV
Pulse Gen Model: 2210
Pulse Gen Serial Number: 7152165

## 2021-04-29 NOTE — Telephone Encounter (Signed)
[name] Fields has reached ERI. Procedure was discussed at last OV with Dr. Caryl Comes 03/10/21. Called patient and advised, questions answered. Advised patient I will forward to Dr. Aquilla Hacker nurse and someone will call to set up date for change out. Patient appreciative of call.

## 2021-04-29 NOTE — Telephone Encounter (Signed)
Pt is scheduled with Oda Kilts, PA-C 05/27/2021 at 8am to schedule generator change.

## 2021-04-30 ENCOUNTER — Ambulatory Visit (INDEPENDENT_AMBULATORY_CARE_PROVIDER_SITE_OTHER): Payer: BC Managed Care – PPO

## 2021-04-30 DIAGNOSIS — I429 Cardiomyopathy, unspecified: Secondary | ICD-10-CM

## 2021-05-19 NOTE — Progress Notes (Signed)
Remote pacemaker transmission.   

## 2021-05-26 NOTE — Progress Notes (Signed)
Electrophysiology Office Note Date: 05/27/2021  ID:  [name] Fields, DOB Mar 11, 1961, MRN [id number]  PCP: Patient, No Pcp Per (Inactive) Primary Cardiologist: None Electrophysiologist: Virl Axe, MD   CC: Pacemaker follow-up  [name] Fields is a 60 y.o. male seen today for Virl Axe, MD for routine electrophysiology followup.  Since last being seen in our clinic the patient reports doing very well.  His device has met ERI by battery voltage.  he denies chest pain, palpitations, dyspnea, PND, orthopnea, nausea, vomiting, dizziness, syncope, edema, weight gain, or early satiety.  Device History: [employer], [employer] PPM implanted 1999 and gen change 2011 for high grade heart block  Past Medical History:  Diagnosis Date   Allergy to ACE inhibitors    Atrial tachycardia ([facility])    Cardiomyopathy, nonischemic ([facility])    GERD (gastroesophageal reflux disease)    Hiatal hernia    High-grade second-degree heart block     Qualifier: Diagnosis of  By: Caryl Comes, MD, Remus Blake    Hypertension    Permanent pacemaker-[employer] [place]; gen re in the summer he has o pain syndrome BP 135% at time long so is his we have been short of his device is in a in a in a Foley disease since the stents removed about 3.0-4 seconds on the episode the patient is very 6 and a [place] when his pacemaker but when he and his placement 2011   Presence of permanent cardiac pacemaker    Sleep apnea    uses CPAP sometimes   WPW (Wolff-Parkinson-White syndrome)    s/pRFCA   Past Surgical History:  Procedure Laterality Date   CARDIAC DEFIBRILLATOR PLACEMENT  2011   St Jude -- Accent RF   CARPAL TUNNEL RELEASE Left 08/18/2020   Procedure: CARPAL TUNNEL RELEASE;  Surgeon: Daryll Brod, MD;  Location: [place];  Service: Orthopedics;  Laterality: Left;  IV REGIONAL FOREARM BLOCK   CARPAL TUNNEL RELEASE Right 12/01/2020   Procedure: CARPAL TUNNEL RELEASE;  Surgeon: Daryll Brod, MD;   Location: [place];  Service: Orthopedics;  Laterality: Right;  IV REGIONAL FOREARM BLOCK   PACEMAKER INSERTION  1999   explanted 2011    Current Outpatient Medications  Medication Sig Dispense Refill   Ascorbic Acid (VITAMIN C) 1000 MG tablet Take 1,000 mg by mouth daily.     cholecalciferol (VITAMIN D3) 25 MCG (1000 UNIT) tablet Take 2,000 Units by mouth daily.     fluticasone (FLONASE) 50 MCG/ACT nasal spray Place into both nostrils as needed for allergies or rhinitis.     hydrochlorothiazide (HYDRODIURIL) 25 MG tablet Take 1 tablet (25 mg total) by mouth daily. 90 tablet 3   Multiple Vitamins-Minerals (MULTIVITAMIN WITH MINERALS) tablet Take 1 tablet by mouth daily.     omeprazole (PRILOSEC) 40 MG capsule TAKE 1 CAPSULE BY MOUTH ONCE DAILY BEFORE  DINNER 30 capsule 0   traMADol (ULTRAM) 50 MG tablet Take 1 tablet (50 mg total) by mouth every 6 (six) hours as needed. (Patient not taking: Reported on 05/27/2021) 20 tablet 0   No current facility-administered medications for this visit.    Allergies:   Lisinopril   Social History: Social History   Socioeconomic History   Marital status: Married    Spouse name: Not on file   Number of children: Not on file   Years of education: Not on file   Highest education level: Not on file  Occupational History  Occupation: truck Education administrator: [employer]  Tobacco Use   Smoking status: Every Day    Packs/day: 0.50    Years: 43.00    Pack years: 21.50    Types: Cigarettes    Start date: 1978   Smokeless tobacco: Never   Tobacco comments:    Pt has stopped and satated back  Vaping Use   Vaping Use: Never used  Substance and Sexual Activity   Alcohol use: Yes    Comment: social   Drug use: No   Sexual activity: Yes  Other Topics Concern   Not on file  Social History Narrative   Not on file   Social Determinants of Health   Financial Resource Strain: Not on file  Food Insecurity: Not on file   Transportation Needs: Not on file  Physical Activity: Not on file  Stress: Not on file  Social Connections: Not on file  Intimate Partner Violence: Not on file    Family History: Family History  Problem Relation Age of Onset   Cancer Mother    Heart disease Mother    Heart Problems Mother        pacemaker   Hypertension Father      Review of Systems: All other systems reviewed and are otherwise negative except as noted above.  Physical Exam: Vitals:   05/27/21 0829  BP: 132/78  Pulse: 87  SpO2: 95%  Weight: 259 lb 9.6 oz (117.8 kg)  Height: 6' 1"  (1.854 m)     GEN- The patient is well appearing, alert and oriented x 3 today.   HEENT: normocephalic, atraumatic; sclera clear, conjunctiva pink; hearing intact; oropharynx clear; neck supple  Lungs- Clear to ausculation bilaterally, normal work of breathing.  No wheezes, rales, rhonchi Heart- Regular rate and rhythm, no murmurs, rubs or gallops  GI- soft, non-tender, non-distended, bowel sounds present  Extremities- no clubbing or cyanosis. No edema MS- no significant deformity or atrophy Skin- warm and dry, no rash or lesion; PPM pocket well healed Psych- euthymic mood, full affect Neuro- strength and sensation are intact  PPM Interrogation- reviewed in detail today,  See PACEART report  EKG:  EKG is not ordered today.  Recent Labs: 11/27/2020: BUN 9; Creatinine, Ser 1.16; Potassium 4.0; Sodium 139   Wt Readings from Last 3 Encounters:  05/27/21 259 lb 9.6 oz (117.8 kg)  03/10/21 259 lb 6.4 oz (117.7 kg)  12/01/20 260 lb 9.3 oz (118.2 kg)     Other studies Reviewed: Additional studies/ records that were reviewed today include: Previous EP office notes, Previous remote checks, Most recent labwork.   Assessment and Plan:  1. Advanced AV block s/p St. Jude PPM  Normal PPM function for device at ERI by battery voltage. Confirmed with St. Jude that [employer] devices don't always "latch" to ERI date after passing the  required battery voltage. He is appropriate for gen change.  See Pace Art report No changes today    2. Cardiomyopathy - resolved LV hypertrophy Myoview 05/28/2020 showed LVEF 52%, no ischemic symptoms Encouraged compression hose as needed. Volume status stable on exam [facility] II symptoms Echo 03/2021 with LVEF 60-65%   3. HTN Continue current regimen.  Current medicines are reviewed at length with the patient today.   The patient does not have concerns regarding his medicines.  The following changes were made today:  none  Labs/ tests ordered today include:  No orders of the defined types were placed in this encounter.  Disposition:   Follow up with Dr. Caryl Comes as usual post gen change.   Jacalyn Lefevre, PA-C  05/27/2021 8:46 AM  [facility] [address] [place] [address] [phone] (office) [phone] (fax)

## 2021-05-27 ENCOUNTER — Ambulatory Visit: Payer: BC Managed Care – PPO | Admitting: Student

## 2021-05-27 ENCOUNTER — Encounter: Payer: Self-pay | Admitting: Student

## 2021-05-27 ENCOUNTER — Other Ambulatory Visit: Payer: Self-pay

## 2021-05-27 VITALS — BP 132/78 | HR 87 | Ht 73.0 in | Wt 259.6 lb

## 2021-05-27 DIAGNOSIS — I428 Other cardiomyopathies: Secondary | ICD-10-CM

## 2021-05-27 DIAGNOSIS — Z45010 Encounter for checking and testing of cardiac pacemaker pulse generator [battery]: Secondary | ICD-10-CM

## 2021-05-27 DIAGNOSIS — I429 Cardiomyopathy, unspecified: Secondary | ICD-10-CM | POA: Diagnosis not present

## 2021-05-27 DIAGNOSIS — Z950 Presence of cardiac pacemaker: Secondary | ICD-10-CM

## 2021-05-27 DIAGNOSIS — I10 Essential (primary) hypertension: Secondary | ICD-10-CM | POA: Diagnosis not present

## 2021-05-27 LAB — CUP PACEART INCLINIC DEVICE CHECK
Battery Remaining Longevity: 0 mo
Battery Voltage: 2.57 V
Brady Statistic RA Percent Paced: 2 %
Brady Statistic RV Percent Paced: 17 %
Date Time Interrogation Session: 20220728084613
Implantable Lead Implant Date: 19990719
Implantable Lead Implant Date: 19990719
Implantable Lead Location: 753859
Implantable Lead Location: 753860
Implantable Pulse Generator Implant Date: 20110711
Lead Channel Impedance Value: 362.5 Ohm
Lead Channel Impedance Value: 362.5 Ohm
Lead Channel Pacing Threshold Amplitude: 0.5 V
Lead Channel Pacing Threshold Amplitude: 0.5 V
Lead Channel Pacing Threshold Amplitude: 1 V
Lead Channel Pacing Threshold Amplitude: 1 V
Lead Channel Pacing Threshold Pulse Width: 0.5 ms
Lead Channel Pacing Threshold Pulse Width: 0.5 ms
Lead Channel Pacing Threshold Pulse Width: 0.7 ms
Lead Channel Pacing Threshold Pulse Width: 0.7 ms
Lead Channel Sensing Intrinsic Amplitude: 10.3 mV
Lead Channel Sensing Intrinsic Amplitude: 3.9 mV
Lead Channel Setting Pacing Amplitude: 2 V
Lead Channel Setting Pacing Amplitude: 2.5 V
Lead Channel Setting Pacing Pulse Width: 0.5 ms
Lead Channel Setting Sensing Sensitivity: 2 mV
Pulse Gen Model: 2210
Pulse Gen Serial Number: 7152165

## 2021-05-27 NOTE — Patient Instructions (Signed)
Medication Instructions: Your physician recommends that you continue on your current medications as directed. Please refer to the Current Medication list given to you today.  Labwork:  Your physician recommends that you return for lab work on: 07/01/2021 BMET and CBC   Procedures/Testing: Your physician has recommended that you have a Generator Change of your device. This is a procedure that replaces a Pacemaker ICD generator that is at the end of its service life. The remaining lifespan of a pacemaker is determined during visits to the [facility]. The battery in a pacemaker does not stop suddenly but rather loses its charge slowly, which lets the cardiologist plan the replacement date.  Follow-Up: Your physician recommends that you schedule a follow-up appointment in 10 - 14 days from 07/07/2021 with the Device clinic for a wound check  Your physician recommends that you schedule a follow-up appointment in 3 months from 07/07/2021 with Dr. Caryl Comes.   If you need a refill on your cardiac medications before your next appointment, please call your pharmacy.   For a Primary Care Physician please call [phone]  -------------------------------------------------------------------------------------------------------------  Please wash with the CHG Soap the night before and morning of procedure (follow instruction page "Preparing For Surgery").   Please report to the [place] Entrance of [facility] at 8:30am ([place], [place] [address])  DO NOT eat or drink anything after midnight the night before procedure  HOLD all medications that morning  You will need someone to drive you home after the procedure  ------------------------------------------------------------------------------------------------------------  [facility] - Preparing For Surgery  Before surgery, you can play an important role. Because skin is not sterile, your skin needs to  be as free of germs as possible. You can reduce the number of germs on your skin by washing with CHG (chlorahexidine gluconate) Soap before surgery.  CHG is an antiseptic cleaner which kills germs and bonds with the skin to continue killing germs even after washing.   Please do not use if you have an allergy to CHG or antibacterial soaps.  If your skin becomes reddened/irritated stop using the CHG.   Do not shave (including legs and underarms) for at least 48 hours prior to first CHG shower.  It is OK to shave your face.  Please follow these instructions carefully:  1.  Shower the night before surgery and the morning of surgery with CHG.  2.  If you choose to wash your hair, wash your hair first as usual with your normal shampoo.  3.  After you shampoo, rinse your hair and body thoroughly to remove the shampoo.  4.  Use CHG as you would any other liquid soap.  You can apply CHG directly to the skin and wash gently with a clean washcloth. 5.  Apply the CHG Soap to your body ONLY FROM THE NECK DOWN.  Do not use on open wounds or open sores.  Avoid contact with your eyes, ears, mouth and genitals (private parts).  Wash genitals (private parts) with your normal soap.  6.  Wash thoroughly, paying special attention to the area where your surgery will be performed.  7.  Thoroughly rinse your body with warm water from the neck down.   8.  DO NOT shower/wash with your normal soap after using and rinsing off the CHG soap.  9.  Pat yourself dry with a clean towel.           10.  Wear clean pajamas.  11.  Place clean sheets on your bed the night of your first shower and do not sleep with pets.  Day of Surgery: Do not apply any deodorants/lotions.  Please wear clean clothes to the hospital/surgery center.

## 2021-06-30 ENCOUNTER — Other Ambulatory Visit: Payer: BC Managed Care – PPO | Admitting: *Deleted

## 2021-06-30 ENCOUNTER — Other Ambulatory Visit: Payer: Self-pay

## 2021-06-30 DIAGNOSIS — Z950 Presence of cardiac pacemaker: Secondary | ICD-10-CM

## 2021-06-30 DIAGNOSIS — I428 Other cardiomyopathies: Secondary | ICD-10-CM

## 2021-06-30 DIAGNOSIS — Z45010 Encounter for checking and testing of cardiac pacemaker pulse generator [battery]: Secondary | ICD-10-CM

## 2021-06-30 DIAGNOSIS — I10 Essential (primary) hypertension: Secondary | ICD-10-CM

## 2021-06-30 DIAGNOSIS — I429 Cardiomyopathy, unspecified: Secondary | ICD-10-CM

## 2021-07-01 ENCOUNTER — Other Ambulatory Visit: Payer: BC Managed Care – PPO

## 2021-07-01 LAB — CBC
Hematocrit: 43.9 % (ref 37.5–51.0)
Hemoglobin: 15.2 g/dL (ref 13.0–17.7)
MCH: 29.2 pg (ref 26.6–33.0)
MCHC: 34.6 g/dL (ref 31.5–35.7)
MCV: 84 fL (ref 79–97)
Platelets: 326 10*3/uL (ref 150–450)
RBC: 5.2 x10E6/uL (ref 4.14–5.80)
RDW: 12.8 % (ref 11.6–15.4)
WBC: 8.3 10*3/uL (ref 3.4–10.8)

## 2021-07-01 LAB — BASIC METABOLIC PANEL
BUN/Creatinine Ratio: 15 (ref 9–20)
BUN: 19 mg/dL (ref 6–24)
CO2: 20 mmol/L (ref 20–29)
Calcium: 10.6 mg/dL — ABNORMAL HIGH (ref 8.7–10.2)
Chloride: 102 mmol/L (ref 96–106)
Creatinine, Ser: 1.26 mg/dL (ref 0.76–1.27)
Glucose: 98 mg/dL (ref 65–99)
Potassium: 4.2 mmol/L (ref 3.5–5.2)
Sodium: 138 mmol/L (ref 134–144)
eGFR: 66 mL/min/{1.73_m2} (ref >59)

## 2021-07-06 ENCOUNTER — Telehealth: Payer: Self-pay | Admitting: Internal Medicine

## 2021-07-06 NOTE — Pre-Procedure Instructions (Signed)
Instructed patient on the following items: Arrival time 1300 Nothing to eat or drink after midnight No meds AM of procedure Responsible person to drive you home and stay with you for 24 hrs Wash with special soap night before and morning of procedure  

## 2021-07-06 NOTE — Progress Notes (Signed)
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  See letter for complete details.  Letter approved by Dr Caryl Comes.

## 2021-07-06 NOTE — Telephone Encounter (Signed)
Patient called in to say that he needs his yearly clearance paperwork from dr Caryl Comes for him to be able to continue to work. Please advise

## 2021-07-07 ENCOUNTER — Other Ambulatory Visit: Payer: Self-pay

## 2021-07-07 ENCOUNTER — Ambulatory Visit (HOSPITAL_COMMUNITY)
Admission: RE | Admit: 2021-07-07 | Discharge: 2021-07-07 | Disposition: A | Discharge status: Home / Self Care | Payer: BC Managed Care – PPO | Source: Ambulatory Visit | Attending: Internal Medicine | Admitting: Internal Medicine

## 2021-07-07 ENCOUNTER — Ambulatory Visit (HOSPITAL_COMMUNITY)
Admission: RE | Disposition: A | Discharge status: Home / Self Care | Payer: Self-pay | Source: Ambulatory Visit | Attending: Internal Medicine

## 2021-07-07 DIAGNOSIS — Z79899 Other long term (current) drug therapy: Secondary | ICD-10-CM | POA: Insufficient documentation

## 2021-07-07 DIAGNOSIS — I495 Sick sinus syndrome: Secondary | ICD-10-CM | POA: Insufficient documentation

## 2021-07-07 DIAGNOSIS — R9431 Abnormal electrocardiogram [ECG] [EKG]: Secondary | ICD-10-CM | POA: Diagnosis not present

## 2021-07-07 DIAGNOSIS — Z8249 Family history of ischemic heart disease and other diseases of the circulatory system: Secondary | ICD-10-CM | POA: Diagnosis not present

## 2021-07-07 DIAGNOSIS — R0609 Other forms of dyspnea: Secondary | ICD-10-CM | POA: Insufficient documentation

## 2021-07-07 DIAGNOSIS — Z45010 Encounter for checking and testing of cardiac pacemaker pulse generator [battery]: Secondary | ICD-10-CM | POA: Insufficient documentation

## 2021-07-07 DIAGNOSIS — F17210 Nicotine dependence, cigarettes, uncomplicated: Secondary | ICD-10-CM | POA: Diagnosis not present

## 2021-07-07 DIAGNOSIS — I10 Essential (primary) hypertension: Secondary | ICD-10-CM | POA: Insufficient documentation

## 2021-07-07 DIAGNOSIS — Z888 Allergy status to other drugs, medicaments and biological substances status: Secondary | ICD-10-CM | POA: Insufficient documentation

## 2021-07-07 DIAGNOSIS — R55 Syncope and collapse: Secondary | ICD-10-CM | POA: Diagnosis not present

## 2021-07-07 DIAGNOSIS — I442 Atrioventricular block, complete: Secondary | ICD-10-CM | POA: Insufficient documentation

## 2021-07-07 HISTORY — PX: PPM GENERATOR CHANGEOUT: EP1233

## 2021-07-07 SURGERY — PPM GENERATOR CHANGEOUT

## 2021-07-07 MED ORDER — FENTANYL CITRATE (PF) 100 MCG/2ML IJ SOLN
INTRAMUSCULAR | Status: AC
  Filled 2021-07-07: qty 2

## 2021-07-07 MED ORDER — CHLORHEXIDINE GLUCONATE 4 % EX LIQD: 4.0000 "application " | Freq: Once | CUTANEOUS | Status: DC

## 2021-07-07 MED ORDER — FENTANYL CITRATE (PF) 100 MCG/2ML IJ SOLN
INTRAMUSCULAR | Status: DC | PRN
  Administered 2021-07-07: 25 ug via INTRAVENOUS
  Administered 2021-07-07: 50 ug via INTRAVENOUS

## 2021-07-07 MED ORDER — SODIUM CHLORIDE 0.9 % IV SOLN: INTRAVENOUS | Status: DC

## 2021-07-07 MED ORDER — CEFAZOLIN SODIUM-DEXTROSE 2-4 GM/100ML-% IV SOLN
2.0000 g | INTRAVENOUS | Status: AC
  Administered 2021-07-07: 2 g via INTRAVENOUS

## 2021-07-07 MED ORDER — LIDOCAINE HCL (PF) 1 % IJ SOLN
INTRAMUSCULAR | Status: DC | PRN
  Administered 2021-07-07: 50 mL

## 2021-07-07 MED ORDER — LIDOCAINE HCL 1 % IJ SOLN
INTRAMUSCULAR | Status: AC
  Filled 2021-07-07: qty 60

## 2021-07-07 MED ORDER — SODIUM CHLORIDE 0.9 % IV SOLN
80.0000 mg | INTRAVENOUS | Status: AC
  Administered 2021-07-07: 80 mg

## 2021-07-07 MED ORDER — ACETAMINOPHEN 325 MG PO TABS: 325.0000 mg | ORAL_TABLET | ORAL | Status: DC | PRN

## 2021-07-07 MED ORDER — MIDAZOLAM HCL 5 MG/5ML IJ SOLN
INTRAMUSCULAR | Status: AC
  Filled 2021-07-07: qty 5

## 2021-07-07 MED ORDER — MIDAZOLAM HCL 5 MG/5ML IJ SOLN
INTRAMUSCULAR | Status: DC | PRN
  Administered 2021-07-07: 2 mg via INTRAVENOUS
  Administered 2021-07-07: 1 mg via INTRAVENOUS

## 2021-07-07 SURGICAL SUPPLY — 6 items
CABLE SURGICAL S-101-97-12 (CABLE) ×2 IMPLANT
DEVICE DISSECT PLASMABLAD 3.0S (MISCELLANEOUS) IMPLANT
PACEMAKER ASSURITY DR-RF (Pacemaker) ×1 IMPLANT
PAD PRO RADIOLUCENT 2001M-C (PAD) ×2 IMPLANT
PLASMABLADE 3.0S (MISCELLANEOUS) ×2
TRAY PACEMAKER INSERTION (PACKS) ×2 IMPLANT

## 2021-07-07 NOTE — H&P (Signed)
Patient Care Team: Patient, No Pcp Per (Inactive) as PCP - General (General Practice) [name], MD as PCP - Sleep Medicine (Cardiology) [name], MD as PCP - Electrophysiology (Cardiology) [name], MD as Attending Physician (Cardiology)   HPI  [name] Fields is a 60 y.o. male admitted for pacemaker generator replacement.  Initially underwent device implantation 1999 for high-grade heart block with generator replacement 2011.  Ventricular pacing percentages have been in the 10-20% range.  The patient denies chest pain, shortness of breath, nocturnal dyspnea, orthopnea or peripheral edema.  There have been no palpitations, lightheadedness or syncope.    Biggest complaint is left leg DATE TEST EF    10/14 Echo  60-65%    7/21 Myoview 52% No ischemia  6/22 Echo  60-65% NO LVH         Date Cr K Hgb  7/16 1.04 3.9    5/19 1.26 4.6    1/22 1.16 4.0 14.0 (7/21)        Records and Results Reviewed   Past Medical History:  Diagnosis Date   Allergy to ACE inhibitors    Atrial tachycardia ([place])    Cardiomyopathy, nonischemic ([facility])    GERD (gastroesophageal reflux disease)    Hiatal hernia    High-grade second-degree heart block     Qualifier: Diagnosis of  By: Caryl Comes, MD, Remus Blake    Hypertension    Permanent pacemaker-St. Jude     DOI 1999; gen re in the summer he has o pain syndrome BP 135% at time long so is his we have been short of his device is in a in a in a Foley disease since the stents removed about 3.0-4 seconds on the episode the patient is very 11 and a [place] when his pacemaker but when he and his placement 2011   Presence of permanent cardiac pacemaker    Sleep apnea    uses CPAP sometimes   WPW (Wolff-Parkinson-White syndrome)    s/pRFCA    Past Surgical History:  Procedure Laterality Date   CARDIAC DEFIBRILLATOR PLACEMENT  2011   St Jude -- Accent RF   CARPAL TUNNEL RELEASE Left 08/18/2020   Procedure: CARPAL  TUNNEL RELEASE;  Surgeon: Daryll Brod, MD;  Location: [place];  Service: Orthopedics;  Laterality: Left;  IV REGIONAL FOREARM BLOCK   CARPAL TUNNEL RELEASE Right 12/01/2020   Procedure: CARPAL TUNNEL RELEASE;  Surgeon: Daryll Brod, MD;  Location: [place];  Service: Orthopedics;  Laterality: Right;  IV REGIONAL FOREARM BLOCK   PACEMAKER INSERTION  1999   explanted 2011    Current Facility-Administered Medications  Medication Dose Route Frequency Provider Last Rate Last Admin   0.9 %  sodium chloride infusion   Intravenous Continuous Shirley Friar, PA-C 50 mL/hr at 07/07/21 1421 New Bag at 07/07/21 1421   ceFAZolin (ANCEF) IVPB 2g/100 mL premix  2 g Intravenous On Call Shirley Friar, PA-C       chlorhexidine (HIBICLENS) 4 % liquid 4 application  4 application Topical Once Shirley Friar, PA-C       gentamicin (GARAMYCIN) 80 mg in sodium chloride 0.9 % 500 mL irrigation  80 mg Irrigation On Call Shirley Friar, PA-C        Allergies  Allergen Reactions   Lisinopril     Lip swelling      Social History   Tobacco Use   Smoking status: Every Day  Packs/day: 0.50    Years: 43.00    Pack years: 21.50    Types: Cigarettes    Start date: 1978   Smokeless tobacco: Never   Tobacco comments:    Pt has stopped and satated back  Vaping Use   Vaping Use: Never used  Substance Use Topics   Alcohol use: Yes    Comment: social   Drug use: No     Family History  Problem Relation Age of Onset   Cancer Mother    Heart disease Mother    Heart Problems Mother        pacemaker   Hypertension Father      Current Meds  Medication Sig   Ascorbic Acid (VITAMIN C) 1000 MG tablet Take 1,000 mg by mouth daily.   cholecalciferol (VITAMIN D3) 25 MCG (1000 UNIT) tablet Take 2,000 Units by mouth daily.   hydrochlorothiazide (HYDRODIURIL) 25 MG tablet Take 1 tablet (25 mg total) by mouth daily.   ibuprofen (ADVIL) 200 MG  tablet Take 800 mg by mouth once.   Multiple Vitamins-Minerals (MULTIVITAMIN WITH MINERALS) tablet Take 1 tablet by mouth daily.     Review of Systems negative except from HPI and PMH  Physical Exam BP (!) 151/101   Pulse 89   Temp 98.2 F (36.8 C) (Oral)   Resp 16   Ht 6' (1.829 m)   Wt 114.8 kg   SpO2 98%   BMI 34.31 kg/m  Well developed and well nourished in no acute distress HENT normal E scleral and icterus clear Neck Supple JVP flat; carotids brisk and full Clear to ausculation Regular rate and rhythm, no murmurs gallops or rub Soft with active bowel sounds No clubbing cyanosis  Edema Alert and oriented, grossly normal motor and sensory function Skin Warm and Dry    Assessment and  Plan  2AVBlock intermittent   Pacemaker St   Jude                                                                                                                                                                                                  Cardiomyopathy-resolved-- LV hypertrophy   Hypertension -well-controlled   Dyspnea on exertion   Presyncope   Abnormal ECG   Device at ERI   We have reviewed the benefits and risks of generator replacement.  These include but are not limited to lead fracture and infection.  The patient understands, agrees and is willing to proceed.

## 2021-07-07 NOTE — Discharge Instructions (Signed)

## 2021-07-07 NOTE — Telephone Encounter (Signed)
Letter sent through [facility] 07/06/2021.  Pt is aware.  See letter for complete details.

## 2021-07-08 ENCOUNTER — Encounter (HOSPITAL_COMMUNITY): Payer: Self-pay | Admitting: Internal Medicine

## 2021-07-08 MED FILL — Lidocaine HCl Local Inj 1%: INTRAMUSCULAR | Qty: 50 | Status: AC

## 2021-07-21 ENCOUNTER — Other Ambulatory Visit: Payer: Self-pay

## 2021-07-21 ENCOUNTER — Ambulatory Visit (INDEPENDENT_AMBULATORY_CARE_PROVIDER_SITE_OTHER): Payer: BC Managed Care – PPO

## 2021-07-21 DIAGNOSIS — I441 Atrioventricular block, second degree: Secondary | ICD-10-CM

## 2021-07-21 LAB — CUP PACEART INCLINIC DEVICE CHECK
Battery Remaining Longevity: 129 mo
Battery Voltage: 3.08 V
Brady Statistic RA Percent Paced: 0.52 %
Brady Statistic RV Percent Paced: 17 %
Date Time Interrogation Session: 20220921131706
Implantable Lead Implant Date: 19990719
Implantable Lead Implant Date: 19990719
Implantable Lead Location: 753859
Implantable Lead Location: 753860
Implantable Pulse Generator Implant Date: 20220907
Lead Channel Impedance Value: 375 Ohm
Lead Channel Impedance Value: 375 Ohm
Lead Channel Pacing Threshold Amplitude: 0.5 V
Lead Channel Pacing Threshold Amplitude: 0.5 V
Lead Channel Pacing Threshold Amplitude: 0.5 V
Lead Channel Pacing Threshold Amplitude: 0.5 V
Lead Channel Pacing Threshold Pulse Width: 0.5 ms
Lead Channel Pacing Threshold Pulse Width: 0.5 ms
Lead Channel Pacing Threshold Pulse Width: 0.5 ms
Lead Channel Pacing Threshold Pulse Width: 0.5 ms
Lead Channel Sensing Intrinsic Amplitude: 5 mV
Lead Channel Sensing Intrinsic Amplitude: 9.4 mV
Lead Channel Setting Pacing Amplitude: 2 V
Lead Channel Setting Pacing Amplitude: 2.5 V
Lead Channel Setting Pacing Pulse Width: 0.5 ms
Lead Channel Setting Sensing Sensitivity: 2 mV
Pulse Gen Model: 2272
Pulse Gen Serial Number: 6519619

## 2021-07-21 NOTE — Progress Notes (Signed)
Wound check appointment. Steri-strips removed. Wound without redness or edema. Incision edges approximated, wound well healed. Normal device function. Thresholds, sensing, and impedances consistent with implant measurements. Device programmed at safety margin for chronic leads. Histogram distribution appropriate for patient and level of activity. 2 mode switches, each lasting 4 seconds with peak A/V rates 223/87. No high ventricular rates.  Patient educated about wound care, arm mobility, lifting restrictions. Patient is enrolled in remote monitoring, next scheduled check 10/08/21.

## 2021-07-21 NOTE — Patient Instructions (Signed)
   After Your Pacemaker   Monitor your pacemaker site for redness, swelling, and drainage. Call the device clinic at [phone] if you experience these symptoms or fever/chills.  Your incision was closed with Dermabond:  You may shower 1 day after your defibrillator implant and wash your incision with soap and water. Avoid lotions, ointments, or perfumes over your incision until it is well-healed.  You may use a hot tub or a pool after your wound check appointment if the incision is completely closed.  Do not lift, push or pull greater than 10 pounds with the affected arm until 6 weeks after your procedure. There are no other restrictions in arm movement after your wound check appointment.  You may drive, unless driving has been restricted by your healthcare providers.  Your Pacemaker is not MRI compatible.  Remote monitoring is used to monitor your pacemaker from home. This monitoring is scheduled every 91 days by our office. It allows us to keep an eye on the functioning of your device to ensure it is working properly. You will routinely see your Electrophysiologist annually (more often if necessary).  

## 2021-08-06 ENCOUNTER — Other Ambulatory Visit: Payer: Self-pay | Admitting: Rehabilitation

## 2021-08-06 DIAGNOSIS — M5416 Radiculopathy, lumbar region: Secondary | ICD-10-CM

## 2021-08-11 ENCOUNTER — Inpatient Hospital Stay
Admission: RE | Admit: 2021-08-11 | Discharge: 2021-08-11 | Disposition: A | Discharge status: Home / Self Care | Payer: BC Managed Care – PPO | Source: Ambulatory Visit | Attending: Rehabilitation | Admitting: Rehabilitation

## 2021-08-11 ENCOUNTER — Other Ambulatory Visit: Payer: BC Managed Care – PPO

## 2021-08-11 NOTE — Discharge Instructions (Signed)

## 2021-08-17 ENCOUNTER — Other Ambulatory Visit: Payer: BC Managed Care – PPO

## 2021-08-17 ENCOUNTER — Inpatient Hospital Stay: Admission: RE | Admit: 2021-08-17 | Payer: BC Managed Care – PPO | Source: Ambulatory Visit

## 2021-08-23 ENCOUNTER — Ambulatory Visit
Admission: RE | Admit: 2021-08-23 | Discharge: 2021-08-23 | Disposition: A | Discharge status: Home / Self Care | Payer: BC Managed Care – PPO | Source: Ambulatory Visit | Attending: Rehabilitation | Admitting: Rehabilitation

## 2021-08-23 DIAGNOSIS — M5416 Radiculopathy, lumbar region: Secondary | ICD-10-CM

## 2021-08-23 MED ORDER — DIAZEPAM 5 MG PO TABS
10.0000 mg | ORAL_TABLET | Freq: Once | ORAL | Status: AC
  Administered 2021-08-23: 10 mg via ORAL

## 2021-08-23 MED ORDER — ONDANSETRON HCL 4 MG/2ML IJ SOLN
4.0000 mg | Freq: Once | INTRAMUSCULAR | Status: DC | PRN
Start: 2021-08-23 — End: 2021-08-24

## 2021-08-23 MED ORDER — MEPERIDINE HCL 50 MG/ML IJ SOLN: 50.0000 mg | Freq: Once | INTRAMUSCULAR | Status: DC | PRN

## 2021-08-23 MED ORDER — IOPAMIDOL (ISOVUE-M 200) INJECTION 41%
15.0000 mL | Freq: Once | INTRAMUSCULAR | Status: AC
  Administered 2021-08-23: 15 mL via INTRATHECAL

## 2021-08-23 NOTE — Discharge Instructions (Signed)

## 2021-08-24 ENCOUNTER — Other Ambulatory Visit (HOSPITAL_COMMUNITY)
Admission: RE | Admit: 2021-08-24 | Discharge: 2021-08-24 | Disposition: A | Discharge status: Home / Self Care | Payer: BC Managed Care – PPO | Source: Ambulatory Visit | Attending: Nurse Practitioner | Admitting: Nurse Practitioner

## 2021-08-24 ENCOUNTER — Ambulatory Visit (HOSPITAL_BASED_OUTPATIENT_CLINIC_OR_DEPARTMENT_OTHER): Payer: BC Managed Care – PPO | Admitting: Nurse Practitioner

## 2021-08-24 ENCOUNTER — Other Ambulatory Visit: Payer: Self-pay

## 2021-08-24 ENCOUNTER — Encounter (HOSPITAL_BASED_OUTPATIENT_CLINIC_OR_DEPARTMENT_OTHER): Payer: Self-pay | Admitting: Nurse Practitioner

## 2021-08-24 VITALS — BP 132/70 | HR 78 | Ht 72.0 in | Wt 258.4 lb

## 2021-08-24 DIAGNOSIS — I10 Essential (primary) hypertension: Secondary | ICD-10-CM

## 2021-08-24 DIAGNOSIS — Z13220 Encounter for screening for lipoid disorders: Secondary | ICD-10-CM

## 2021-08-24 DIAGNOSIS — Z0000 Encounter for general adult medical examination without abnormal findings: Secondary | ICD-10-CM | POA: Insufficient documentation

## 2021-08-24 DIAGNOSIS — G4733 Obstructive sleep apnea (adult) (pediatric): Secondary | ICD-10-CM

## 2021-08-24 DIAGNOSIS — Z950 Presence of cardiac pacemaker: Secondary | ICD-10-CM

## 2021-08-24 DIAGNOSIS — Z113 Encounter for screening for infections with a predominantly sexual mode of transmission: Secondary | ICD-10-CM | POA: Insufficient documentation

## 2021-08-24 DIAGNOSIS — Z1329 Encounter for screening for other suspected endocrine disorder: Secondary | ICD-10-CM

## 2021-08-24 DIAGNOSIS — Z9989 Dependence on other enabling machines and devices: Secondary | ICD-10-CM

## 2021-08-24 DIAGNOSIS — Z114 Encounter for screening for human immunodeficiency virus [HIV]: Secondary | ICD-10-CM

## 2021-08-24 DIAGNOSIS — Z130 Encounter for screening for diseases of the blood and blood-forming organs and certain disorders involving the immune mechanism: Secondary | ICD-10-CM

## 2021-08-24 DIAGNOSIS — Z1211 Encounter for screening for malignant neoplasm of colon: Secondary | ICD-10-CM

## 2021-08-24 DIAGNOSIS — Z1321 Encounter for screening for nutritional disorder: Secondary | ICD-10-CM

## 2021-08-24 DIAGNOSIS — Z139 Encounter for screening, unspecified: Secondary | ICD-10-CM

## 2021-08-24 DIAGNOSIS — D235 Other benign neoplasm of skin of trunk: Secondary | ICD-10-CM | POA: Insufficient documentation

## 2021-08-24 DIAGNOSIS — Z125 Encounter for screening for malignant neoplasm of prostate: Secondary | ICD-10-CM

## 2021-08-24 DIAGNOSIS — Z13228 Encounter for screening for other metabolic disorders: Secondary | ICD-10-CM

## 2021-08-24 NOTE — Progress Notes (Signed)
[name] Render, DNP, AGNP-c [facility] [address]  [place], [place] [address] [phone]  New patient visit   Patient: [name] Fields   DOB: [birth year]   60 y.o. Male  MRN: [id number] Visit Date: 08/24/2021  Patient Care Team: Jefferey Lippmann, Coralee Pesa, NP as PCP - General (Nurse Practitioner) [name], MD as PCP - Sleep Medicine (Cardiology) [name], MD as PCP - Electrophysiology (Cardiology) [name], MD as Attending Physician (Cardiology)  Today's healthcare provider: Orma Render, NP   Chief Complaint  Patient presents with   Establish Care    Prior PCP - [facility]. Also sees [facility]    Patient states he hasnt seen a primary care physician in quite a while and is approaching the age where he needs his general maintenance tasks completed. 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 He is followed by the sleep clinic at [facility] (Dr. Fransico Him) he states its been 3-4 years since his last sleep study and doesn't feel his CPAP is effective. He states "I sleep better without it". He is aware that he may need an updated study to make sure that current regiment is therapeutic.    Subjective    [name] Fields is a 60 y.o. male who presents today as a new patient to establish care.  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 This was replaced last month by cardiology. He denies any concerns or symptoms.   He has a history of back and neck injury with surgical repair. He has been having pain and numbness/tingling in his hands that he feels is related. He is seeing Dr. Earlean Polka for this and working on management.   He has a history of OSA and uses a CPAP, but does not feel it is as helpful as it could be. He feels he may need a new sleep study.   He  has concerns today for two "lumps" on his back that are not painful, but bothersome.    Past Medical History:  Diagnosis Date   Allergy to ACE inhibitors    Atrial tachycardia (HCC)    Cardiomyopathy, nonischemic (HCC)    GERD (gastroesophageal reflux disease)    Hiatal hernia    High-grade second-degree heart block     Qualifier: Diagnosis of  By: Caryl Comes, MD, Remus Blake    Hypertension    Permanent pacemaker-[place]; gen re in the summer he has o pain syndrome BP 135% at time long so is his we have been short of his device is in a in a in a Foley disease since the stents removed about 3.0-4 seconds on the episode the patient is very 76 and a [place] when his pacemaker but when he and his placement 2011   Presence of permanent cardiac pacemaker    Sleep apnea    uses CPAP sometimes   WPW (Wolff-Parkinson-White syndrome)    s/pRFCA   Past Surgical History:  Procedure Laterality Date   CARDIAC DEFIBRILLATOR PLACEMENT  2011   St Jude -- [employer] RF   CARPAL TUNNEL RELEASE Left 08/18/2020   Procedure: CARPAL TUNNEL RELEASE;  Surgeon: Daryll Brod, MD;  Location: [facility];  Service: Orthopedics;  Laterality: Left;  IV REGIONAL FOREARM BLOCK   CARPAL TUNNEL RELEASE Right 12/01/2020   Procedure: CARPAL TUNNEL RELEASE;  Surgeon: Daryll Brod, MD;  Location: [facility];  Service: Orthopedics;  Laterality: Right;  IV REGIONAL FOREARM BLOCK   PACEMAKER INSERTION  1999   explanted 2011   PPM GENERATOR CHANGEOUT N/A 07/07/2021   Procedure: PPM GENERATOR CHANGEOUT;  Surgeon: [name], MD;  Location: [facility];  Service: Cardiovascular;  Laterality: N/A;   Family Status  Relation Name Status   Mother  Alive   Father  Alive   MGM  Deceased   MGF  Deceased   PGM  Deceased   PGF  Deceased   Family History  Problem Relation Age of Onset   Cancer Mother    Heart disease Mother    Heart Problems Mother        pacemaker    Hypertension Father    Social History   Socioeconomic History   Marital status: Married    Spouse name: Not on file   Number of children: Not on file   Years of education: Not on file   Highest education level: Not on file  Occupational History   Occupation: truck driver    Employer: [employer]  Tobacco Use   Smoking status: Every Day    Packs/day: 0.50    Years: 43.00    Pack years: 21.50    Types: Cigarettes    Start date: 1978   Smokeless tobacco: Never   Tobacco comments:    Pt has stopped and satated back  Vaping Use   Vaping Use: Never used  Substance and Sexual Activity   Alcohol use: Yes    Comment: social   Drug use: No   Sexual activity: Yes  Other Topics Concern   Not on file  Social History Narrative   Not on file   Social Determinants of Health   Financial Resource Strain: Not on file  Food Insecurity: Not on file  Transportation Needs: Not on file  Physical Activity: Not on file  Stress: Not on file  Social Connections: Not on file   Outpatient Medications Prior to Visit  Medication Sig   Ascorbic Acid (VITAMIN C) 1000 MG tablet Take 1,000 mg by mouth daily.   cholecalciferol (VITAMIN D3) 25 MCG (1000 UNIT) tablet Take 2,000 Units by mouth daily.   fluticasone (FLONASE) 50 MCG/ACT nasal spray Place into both nostrils as needed for allergies or rhinitis.   hydrochlorothiazide (HYDRODIURIL) 25 MG tablet Take 1 tablet (25 mg total) by mouth daily.   ibuprofen (ADVIL) 800 MG tablet Take by mouth.   Multiple Vitamins-Minerals (MULTIVITAMIN WITH MINERALS) tablet Take 1 tablet by mouth daily.   [DISCONTINUED] amoxicillin (AMOXIL) 500 MG tablet Take 500 mg by mouth 3 (three) times daily. (Patient not taking: Reported on 08/24/2021)   [DISCONTINUED] ibuprofen (ADVIL) 200 MG tablet Take 800 mg by mouth once.   [DISCONTINUED] omeprazole (PRILOSEC) 40 MG capsule TAKE 1 CAPSULE BY MOUTH ONCE DAILY BEFORE  DINNER (Patient not taking: No sig reported)    [DISCONTINUED] traMADol (ULTRAM) 50 MG tablet Take 1 tablet (50 mg total) by mouth every 6 (six) hours as needed. (Patient not taking: No sig reported)   No facility-administered medications prior to visit.   Allergies  Allergen Reactions   Lisinopril     Lip swelling    Immunization History  Administered Date(s) Administered   PFIZER(Purple Top)SARS-COV-2 Vaccination  02/05/2020, 03/04/2020    Health Maintenance  Topic Date Due   Pneumococcal Vaccine 21-60 Years old (1 - PCV) Never done   TETANUS/TDAP  Never done   COLONOSCOPY (Pts 45-62yrs Insurance coverage will need to be confirmed)  Never done   Zoster Vaccines- Shingrix (1 of 2) Never done   COVID-19 Vaccine (3 - Booster for [place] series) 04/29/2020   INFLUENZA VACCINE  08/01/2022 (Originally 05/31/2021)   HIV Screening  Completed   HPV VACCINES  Aged Out   Hepatitis C Screening  Discontinued    Patient Care Team: Scorpio Fortin, Coralee Pesa, NP as PCP - General (Nurse Practitioner) [name], MD as PCP - Sleep Medicine (Cardiology) [name], MD as PCP - Electrophysiology (Cardiology) [name], MD as Attending Physician (Cardiology)  Review of Systems All review of systems negative except what is listed in the HPI    Objective    BP 132/70   Pulse 78   Ht 6' (1.829 m)   Wt 258 lb 6.4 oz (117.2 kg)   SpO2 98%   BMI 35.05 kg/m  Physical Exam Vitals and nursing note reviewed.  Constitutional:      General: He is not in acute distress.    Appearance: Normal appearance.  HENT:     Head: Normocephalic.  Eyes:     Extraocular Movements: Extraocular movements intact.     Conjunctiva/sclera: Conjunctivae normal.     Pupils: Pupils are equal, round, and reactive to light.  Neck:     Vascular: No carotid bruit.  Cardiovascular:     Rate and Rhythm: Normal rate and regular rhythm.     Pulses: Normal pulses.     Heart sounds: Normal heart sounds.  Pulmonary:     Effort: Pulmonary effort is normal. No  respiratory distress.     Breath sounds: Normal breath sounds. No wheezing.  Abdominal:     General: Abdomen is flat. Bowel sounds are normal. There is no distension.     Palpations: Abdomen is soft.     Tenderness: There is no abdominal tenderness. There is no guarding.  Musculoskeletal:        General: Signs of injury present.     Right lower leg: No edema.     Left lower leg: No edema.     Comments: Previous back and cervical injury with surgical repair.   Skin:    General: Skin is warm and dry.     Capillary Refill: Capillary refill takes less than 2 seconds.  Neurological:     General: No focal deficit present.     Mental Status: He is alert and oriented to person, place, and time.  Psychiatric:        Mood and Affect: Mood normal.        Behavior: Behavior normal.        Thought Content: Thought content normal.        Judgment: Judgment normal.     Depression Screen PHQ 2/9 Scores 08/24/2021  PHQ - 2 Score 2  PHQ- 9 Score 10   No results found for any visits on 08/24/21.  Assessment & Plan      Problem List Items Addressed This Visit     Hypertension    Well controlled. No changes      PACEMAKER, PERMANENT    Replaced last month No concerns today      Relevant Orders   CBC with Differential/Platelet   OSA on CPAP    Unclear  when last sleep study performed.  Recommend repeat today- will send referral to [facility] for evaluation and recommendations.        Relevant Orders   CBC with Differential/Platelet   Comprehensive metabolic panel   Lipid panel   Hemoglobin A1c   Ambulatory referral to Neurology   Dilated pore of [place] of back    Drainage to appx 3cm dilated pore and 1cm dilated pore located on the right upper back today.  Large volumes of non-purulent material extracted. Patient tolerated well.  No concerning findings.  Recommend monitoring. We can consider incision and removal of pore capsule if build-up presents again.       Encounter for  medical examination to establish care - Primary    Review of current and past medical history, social history, medication, and family history.  Review of care gaps and health maintenance recommendations.  Records from recent providers to be requested if not available in Chart Review or Care Everywhere.  Recommendations for health maintenance, diet, and exercise provided.  Labs today: CBC, CMP, Lipids, A1c, PSA, STI, VItD HM Recommendations: Colon cancer screen, Shingles vaccine, flu vaccine, covid vaccine, tdap CPE due: In next 6-12 months       Relevant Orders   CBC with Differential/Platelet   Comprehensive metabolic panel   Cytology (oral, anal, urethral) ancillary only   Lipid panel   VITAMIN D 25 Hydroxy (Vit-D Deficiency, Fractures)   Hemoglobin A1c   PSA Total (Reflex To Free)   HIV antibody (with reflex)   Other Visit Diagnoses     Screening for colon cancer       Relevant Orders   Ambulatory referral to Gastroenterology   Screening for deficiency anemia       Relevant Orders   CBC with Differential/Platelet   Screening for lipid disorders       Relevant Orders   Lipid panel   Screening for endocrine, nutritional, metabolic and immunity disorder       Relevant Orders   Comprehensive metabolic panel   Lipid panel   VITAMIN D 25 Hydroxy (Vit-D Deficiency, Fractures)   Hemoglobin A1c   Encounter for health-related screening       Relevant Orders   CBC with Differential/Platelet   Comprehensive metabolic panel   Cytology (oral, anal, urethral) ancillary only   Lipid panel   VITAMIN D 25 Hydroxy (Vit-D Deficiency, Fractures)   Hemoglobin A1c   PSA Total (Reflex To Free)   HIV antibody (with reflex)   Ambulatory referral to Gastroenterology   Screening for prostate cancer       Relevant Orders   PSA Total (Reflex To Free)   Screening for HIV without presence of risk factors       Relevant Orders   HIV antibody (with reflex)        Return in about 1 year  (around 08/24/2022).      Arlesia Kiel, Coralee Pesa, NP, DNP, AGNP-C [facility] at [facility]   .

## 2021-08-24 NOTE — Assessment & Plan Note (Signed)
Replaced last month No concerns today

## 2021-08-24 NOTE — Assessment & Plan Note (Signed)
Unclear when last sleep study performed.  Recommend repeat today- will send referral to [facility] for evaluation and recommendations.

## 2021-08-24 NOTE — Patient Instructions (Addendum)
Recommendations from today's visit: Things look great today! We will let you know if your lab results need any extra attention.  If you decide you want to quit smoking and want to talk about medication options, we can do that.  They will call you to schedule the colonoscopy.   Information on diet, exercise, and health maintenance recommendations are listed below. This is information to help you be sure you are on track for optimal health and monitoring.   Please look over this and let us know if you have any questions or if you have completed any of the health maintenance outside of [place] so that we can be sure your records are up to date.  ___________________________________________________________  Thank you for choosing [place] [facility] for your Primary Care needs. I am excited for the opportunity to partner with you to meet your health care goals. It was a pleasure meeting you today!  I am an Adult-Geriatric Nurse Practitioner with a background in caring for patients for more than 20 years. I provide primary care and sports medicine services to patients age 62 and older within this office. I am also the director of the APP Fellowship with [facility].   I am passionate about providing the best service to you through preventive medicine and supportive care. I consider you a part of the medical team and value your input. I work diligently to ensure that you are heard and your needs are met in a safe and effective manner. I want you to feel comfortable with me as your provider and want you to know that your health concerns are important to me.  For your information, our office hours are Monday- Friday 8:00 AM - 5:00 PM At this time I am not in the office on Wednesdays.  If you have questions or concerns, please call our office at [phone] or send [place] a MyChart message and we will respond as quickly as possible.   For all urgent or time sensitive needs we  ask that you please call the office to avoid delays. MyChart is not constantly monitored and replies may take up to 72 business hours.  MyChart Policy: MyChart allows for you to see your visit notes, after visit summary, provider recommendations, lab and tests results, make an appointment, request refills, and contact your provider or the office for non-urgent questions or concerns. Providers are seeing patients during normal business hours and do not have built in time to review MyChart messages.  We ask that you allow a minimum of 4 business days for responses to [employer]. For this reason, please do not send urgent requests through [place]. Please call the office at [phone]. Complex MyChart concerns may require a visit. Your provider may request you schedule a virtual or in person visit to ensure we are providing the best care possible. MyChart messages sent after 4:00 PM on Friday will not be received by the provider until Monday morning.    Lab and Test Results: You will receive your lab and test results on MyChart as soon as they are completed and results have been sent by the lab or testing facility. Due to this service, you will receive your results BEFORE your provider.  I review lab and tests results each morning prior to seeing patients. Some results require collaboration with other providers to ensure you are receiving the most appropriate care. For this reason, we ask that you please allow a minimum of 4 business days  for your provider to receive and review lab and test results and contact you about these.  Most lab and test result comments from the provider will be sent through [place]. Your provider may recommend changes to the plan of care, follow-up visits, repeat testing, ask questions, or request an office visit to discuss these results. You may reply directly to this message or call the office at [phone] to provide information for the provider or set up an  appointment. In some instances, you will be called with test results and recommendations. Please let us know if this is preferred and we will make note of this in your chart to provide this for you.    If you have not heard a response to your lab or test results in 72 business hours, please call the office to let us know.   After Hours: For all non-emergency after hours needs, please call the office at [phone] and select the option to reach the on-call provider service. On-call services are shared between multiple [place] offices and therefore it will not be possible to speak directly with your provider. On-call providers may provide medical advice and recommendations, but are unable to provide refills for maintenance medications.  For all emergency or urgent medical needs after normal business hours, we recommend that you seek care at the closest Urgent Care or Emergency Department to ensure appropriate treatment in a timely manner.  [facility] [place] has a 24 hour emergency room located on the ground floor for your convenience.    Please do not hesitate to reach out to [place] with concerns.   Thank you, again, for choosing me as your health care partner. I appreciate your trust and look forward to learning more about you.   Worthy Keeler, DNP, AGNP-c ___________________________________________________________  Health Maintenance Recommendations Screening Testing Mammogram Every 1 -2 years based on history and risk factors Starting at age 56 Pap Smear Ages 21-39 every 3 years Ages 69-65 every 5 years with HPV testing More frequent testing may be required based on results and history Colon Cancer Screening Every 1-10 years based on test performed, risk factors, and history Starting at age 56 Bone Density Screening Every 2-10 years based on history Starting at age 93 for women Recommendations for men differ based on medication usage, history, and risk  factors AAA Screening One time ultrasound Men 50-43 years old who have every smoked Lung Cancer Screening Low Dose Lung CT every 12 months Age 72-80 years with a 30 pack-year smoking history who still smoke or who have quit within the last 15 years  Screening Labs Routine  Labs: Complete Blood Count (CBC), Complete Metabolic Panel (CMP), Cholesterol (Lipid Panel) Every 6-12 months based on history and medications May be recommended more frequently based on current conditions or previous results Hemoglobin A1c Lab Every 3-12 months based on history and previous results Starting at age 11 or earlier with diagnosis of diabetes, high cholesterol, BMI >26, and/or risk factors Frequent monitoring for patients with diabetes to ensure blood sugar control Thyroid Panel (TSH w/ T3 & T4) Every 6 months based on history, symptoms, and risk factors May be repeated more often if on medication HIV One time testing for all patients 29 and older May be repeated more frequently for patients with increased risk factors or exposure Hepatitis C One time testing for all patients 68 and older May be repeated more frequently for patients with increased risk factors or exposure Gonorrhea, Chlamydia Every 12 months for  all sexually active persons 13-24 years Additional monitoring may be recommended for those who are considered high risk or who have symptoms PSA Men 55-3 years old with risk factors Additional screening may be recommended from age 33-69 based on risk factors, symptoms, and history  Vaccine Recommendations Tetanus Booster All adults every 10 years Flu Vaccine All patients 6 months and older every year COVID Vaccine All patients 12 years and older Initial dosing with booster May recommend additional booster based on age and health history HPV Vaccine 2 doses all patients age 62-26 Dosing may be considered for patients over 26 Shingles Vaccine (Shingrix) 2 doses all adults 77 years and  older Pneumonia (Pneumovax 23) All adults 80 years and older May recommend earlier dosing based on health history Pneumonia (Prevnar 62) All adults 76 years and older Dosed 1 year after Pneumovax 23  Additional Screening, Testing, and Vaccinations may be recommended on an individualized basis based on family history, health history, risk factors, and/or exposure.  __________________________________________________________  Diet Recommendations for All Patients  I recommend that all patients maintain a diet low in saturated fats, carbohydrates, and cholesterol. While this can be challenging at first, it is not impossible and small changes can make big differences.  Things to try: Decreasing the amount of soda, sweet tea, and/or juice to one or less per day and replace with water While water is always the first choice, if you do not like water you may consider adding a water additive without sugar to improve the taste other sugar free drinks Replace potatoes with a brightly colored vegetable at dinner Use healthy oils, such as canola oil or olive oil, instead of butter or hard margarine Limit your bread intake to two pieces or less a day Replace regular pasta with low carb pasta options Bake, broil, or grill foods instead of frying Monitor portion sizes  Eat smaller, more frequent meals throughout the day instead of large meals  An important thing to remember is, if you love foods that are not great for your health, you don't have to give them up completely. Instead, allow these foods to be a reward when you have done well. Allowing yourself to still have special treats every once in a while is a nice way to tell yourself thank you for working hard to keep yourself healthy.   Also remember that every day is a new day. If you have a bad day and "fall off the wagon", you can still climb right back up and keep moving along on your journey!  We have resources available to help you!  Some  websites that may be helpful include: www.http://carter.biz/  Www.VeryWellFit.com _____________________________________________________________  Activity Recommendations for All Patients  I recommend that all adults get at least 20 minutes of moderate physical activity that elevates your heart rate at least 5 days out of the week.  Some examples include: Walking or jogging at a pace that allows you to carry on a conversation Cycling (stationary bike or outdoors) Water aerobics Yoga Weight lifting Dancing If physical limitations prevent you from putting stress on your joints, exercise in a pool or seated in a chair are excellent options.  Do determine your MAXIMUM heart rate for activity: YOUR AGE - 220 = MAX HeartRate   Remember! Do not push yourself too hard.  Start slowly and build up your pace, speed, weight, time in exercise, etc.  Allow your body to rest between exercise and get good sleep. You will need more water than  normal when you are exerting yourself. Do not wait until you are thirsty to drink. Drink with a purpose of getting in at least 8, 8 ounce glasses of water a day plus more depending on how much you exercise and sweat.    If you begin to develop dizziness, chest pain, abdominal pain, jaw pain, shortness of breath, headache, vision changes, lightheadedness, or other concerning symptoms, stop the activity and allow your body to rest. If your symptoms are severe, seek emergency evaluation immediately. If your symptoms are concerning, but not severe, please let us know so that we can recommend further evaluation.   ________________________________________________________________

## 2021-08-24 NOTE — Assessment & Plan Note (Signed)
Drainage to appx 3cm dilated pore and 1cm dilated pore located on the right upper back today.  Large volumes of non-purulent material extracted. Patient tolerated well.  No concerning findings.  Recommend monitoring. We can consider incision and removal of pore capsule if build-up presents again.

## 2021-08-24 NOTE — Assessment & Plan Note (Addendum)
Review of current and past medical history, social history, medication, and family history.  Review of care gaps and health maintenance recommendations.  Records from recent providers to be requested if not available in Chart Review or Care Everywhere.  Recommendations for health maintenance, diet, and exercise provided.  Labs today: CBC, CMP, Lipids, A1c, PSA, STI, VItD HM Recommendations: Colon cancer screen, Shingles vaccine, flu vaccine, covid vaccine, tdap CPE due: In 12 months

## 2021-08-24 NOTE — Assessment & Plan Note (Signed)
Well controlled. No changes. 

## 2021-08-25 LAB — CBC WITH DIFFERENTIAL/PLATELET
Basophils Absolute: 0 10*3/uL (ref 0.0–0.2)
Basos: 0 %
EOS (ABSOLUTE): 0.1 10*3/uL (ref 0.0–0.4)
Eos: 1 %
Hematocrit: 43.1 % (ref 37.5–51.0)
Hemoglobin: 14.4 g/dL (ref 13.0–17.7)
Immature Grans (Abs): 0 10*3/uL (ref 0.0–0.1)
Immature Granulocytes: 0 %
Lymphocytes Absolute: 4.4 10*3/uL — ABNORMAL HIGH (ref 0.7–3.1)
Lymphs: 49 %
MCH: 28.5 pg (ref 26.6–33.0)
MCHC: 33.4 g/dL (ref 31.5–35.7)
MCV: 85 fL (ref 79–97)
Monocytes Absolute: 0.6 10*3/uL (ref 0.1–0.9)
Monocytes: 6 %
Neutrophils Absolute: 4 10*3/uL (ref 1.4–7.0)
Neutrophils: 44 %
Platelets: 341 10*3/uL (ref 150–450)
RBC: 5.06 x10E6/uL (ref 4.14–5.80)
RDW: 13.2 % (ref 11.6–15.4)
WBC: 9.1 10*3/uL (ref 3.4–10.8)

## 2021-08-25 LAB — COMPREHENSIVE METABOLIC PANEL
ALT: 23 IU/L (ref 0–44)
AST: 22 IU/L (ref 0–40)
Albumin/Globulin Ratio: 1.3 (ref 1.2–2.2)
Albumin: 4.3 g/dL (ref 3.8–4.9)
Alkaline Phosphatase: 150 IU/L — ABNORMAL HIGH (ref 44–121)
BUN/Creatinine Ratio: 11 (ref 9–20)
BUN: 11 mg/dL (ref 6–24)
Bilirubin Total: 0.3 mg/dL (ref 0.0–1.2)
CO2: 22 mmol/L (ref 20–29)
Calcium: 9.9 mg/dL (ref 8.7–10.2)
Chloride: 102 mmol/L (ref 96–106)
Creatinine, Ser: 1.03 mg/dL (ref 0.76–1.27)
Globulin, Total: 3.3 g/dL (ref 1.5–4.5)
Glucose: 75 mg/dL (ref 70–99)
Potassium: 4.2 mmol/L (ref 3.5–5.2)
Sodium: 137 mmol/L (ref 134–144)
Total Protein: 7.6 g/dL (ref 6.0–8.5)
eGFR: 84 mL/min/{1.73_m2} (ref >59)

## 2021-08-25 LAB — CYTOLOGY, (ORAL, ANAL, URETHRAL) ANCILLARY ONLY
Chlamydia: NEGATIVE
Comment: NEGATIVE
Comment: NEGATIVE
Comment: NORMAL
Neisseria Gonorrhea: NEGATIVE
Trichomonas: NEGATIVE

## 2021-08-25 LAB — HIV ANTIBODY (ROUTINE TESTING W REFLEX): HIV Screen 4th Generation wRfx: NONREACTIVE

## 2021-08-25 LAB — LIPID PANEL
Chol/HDL Ratio: 5.1 ratio — ABNORMAL HIGH (ref 0.0–5.0)
Cholesterol, Total: 215 mg/dL — ABNORMAL HIGH (ref 100–199)
HDL: 42 mg/dL (ref >39)
LDL Chol Calc (NIH): 126 mg/dL — ABNORMAL HIGH (ref 0–99)
Triglycerides: 264 mg/dL — ABNORMAL HIGH (ref 0–149)
VLDL Cholesterol Cal: 47 mg/dL — ABNORMAL HIGH (ref 5–40)

## 2021-08-25 LAB — HEMOGLOBIN A1C
Est. average glucose Bld gHb Est-mCnc: 137 mg/dL
Hgb A1c MFr Bld: 6.4 % — ABNORMAL HIGH (ref 4.8–5.6)

## 2021-08-25 LAB — VITAMIN D 25 HYDROXY (VIT D DEFICIENCY, FRACTURES): Vit D, 25-Hydroxy: 22.2 ng/mL — ABNORMAL LOW (ref 30.0–100.0)

## 2021-08-26 ENCOUNTER — Other Ambulatory Visit (HOSPITAL_BASED_OUTPATIENT_CLINIC_OR_DEPARTMENT_OTHER): Payer: Self-pay | Admitting: Nurse Practitioner

## 2021-08-26 DIAGNOSIS — E782 Mixed hyperlipidemia: Secondary | ICD-10-CM

## 2021-08-26 DIAGNOSIS — R7303 Prediabetes: Secondary | ICD-10-CM

## 2021-08-26 DIAGNOSIS — E559 Vitamin D deficiency, unspecified: Secondary | ICD-10-CM

## 2021-08-26 MED ORDER — VITAMIN D (ERGOCALCIFEROL) 1.25 MG (50000 UNIT) PO CAPS: 50000.0000 [IU] | ORAL_CAPSULE | ORAL | 0 refills | Status: DC

## 2021-09-13 ENCOUNTER — Encounter: Payer: Self-pay | Admitting: Gastroenterology

## 2021-09-20 ENCOUNTER — Other Ambulatory Visit: Payer: Self-pay

## 2021-09-20 ENCOUNTER — Encounter (HOSPITAL_BASED_OUTPATIENT_CLINIC_OR_DEPARTMENT_OTHER): Payer: Self-pay | Admitting: Nurse Practitioner

## 2021-09-20 ENCOUNTER — Ambulatory Visit
Admission: RE | Admit: 2021-09-20 | Discharge: 2021-09-20 | Disposition: A | Discharge status: Home / Self Care | Payer: BC Managed Care – PPO | Source: Ambulatory Visit | Attending: Nurse Practitioner | Admitting: Nurse Practitioner

## 2021-09-20 ENCOUNTER — Ambulatory Visit (HOSPITAL_BASED_OUTPATIENT_CLINIC_OR_DEPARTMENT_OTHER): Payer: BC Managed Care – PPO | Admitting: Nurse Practitioner

## 2021-09-20 VITALS — BP 140/100 | HR 87 | Ht 73.0 in | Wt 257.0 lb

## 2021-09-20 DIAGNOSIS — M79671 Pain in right foot: Secondary | ICD-10-CM

## 2021-09-20 MED ORDER — TRAMADOL HCL 50 MG PO TABS
50.0000 mg | ORAL_TABLET | Freq: Three times a day (TID) | ORAL | 0 refills | Status: AC | PRN
Start: 2021-09-20 — End: 2021-09-25

## 2021-09-20 NOTE — Assessment & Plan Note (Addendum)
Injury to the right great toe with suspected fracture of the phalanges and metatarsals. Significant edema is present with ecchymosis present.  Recommend x-ray for evaluation to ensure proper alignment of bones.  Patient placed in foot cast/post-op shoe and provided with instructions on ice, elevation, and rest.  Tramadol for pain with strict precautions on use while driving.  Will monitor for results and make changes to plan of care as necessary. Otherwise, follow-up in 3-4 weeks for re-evaluation.  Plan to start therapy once injury has healed.

## 2021-09-20 NOTE — Patient Instructions (Addendum)
I have sent the order for the x-ray to [facility].   [place] [facility] Address: [employer] [employer], [place], [place] [address] Hours: 8am -5:30pm Monday through Friday You can walk in for x-rays and don't need an appointment.   I have sent in medication for pain to the [facility] on file. Do not take this and drive as it can make you sleepy.  You can take ibuprofen if you are driving and having pain.   I want you to ice the foot three times a day for 20 minutes each time at least for the next 3 days.  Try to keep the foot propped up and avoid walking a lot on it to allow the injury to heal.  You will likely need to wear the shoe for several weeks until healing has occurred.  We will plan to have you come back in 4 weeks to see how you are doing.   I have included some information on the cast shoe and both foot and toe fracture. We will know more once we get the x-rays back.

## 2021-09-20 NOTE — Progress Notes (Signed)
Acute Office Visit  Subjective:    Patient ID: [name] Fields, male    DOB: Mar 06, 1961, 60 y.o.   MRN: [id number]  Chief Complaint  Patient presents with   acute foot pain    Patient is her today for Right foot, Great toe. Pain is a 10. Swollen and bruised. He hit foot on a wooden bed rail in motel room. He has taken nothing for pain. BP is elevated at todays visit.      HPI Patient is in today for pain and swelling in the right foot after hitting his great toe on a bed frame last week. 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 He endorses 10/10 pain in the great toe and to the dorsal surface of the foot towards the medial side. He has not tried anything to make it better. He did continue to drive after the injury. He has all sensation in his foot.   Past Medical History:  Diagnosis Date   Allergy to ACE inhibitors    Atrial tachycardia (HCC)    Cardiomyopathy, nonischemic (HCC)    GERD (gastroesophageal reflux disease)    Hiatal hernia    High-grade second-degree heart block     Qualifier: Diagnosis of  By: Caryl Comes, MD, Remus Blake    Hypertension    Permanent pacemaker-[employer]; gen re in the summer he has o pain syndrome BP 135% at time long so is his we have been short of his device is in a in a in a Foley disease since the stents removed about 3.0-4 seconds on the episode the patient is very 53 and a [place] when his pacemaker but when he and his placement 2011   Presence of permanent cardiac pacemaker    Sleep apnea    uses CPAP sometimes   WPW (Wolff-Parkinson-White syndrome)    s/pRFCA    Past Surgical History:  Procedure Laterality Date   CARDIAC DEFIBRILLATOR PLACEMENT  2011   St Jude -- [employer] RF   CARPAL TUNNEL RELEASE Left 08/18/2020   Procedure: CARPAL TUNNEL RELEASE;  Surgeon: Daryll Brod, MD;  Location: [facility];  Service: Orthopedics;  Laterality: Left;  IV  REGIONAL FOREARM BLOCK   CARPAL TUNNEL RELEASE Right 12/01/2020   Procedure: CARPAL TUNNEL RELEASE;  Surgeon: Daryll Brod, MD;  Location: [facility];  Service: Orthopedics;  Laterality: Right;  IV REGIONAL FOREARM BLOCK   PACEMAKER INSERTION  1999   explanted 2011   PPM GENERATOR CHANGEOUT N/A 07/07/2021   Procedure: PPM GENERATOR CHANGEOUT;  Surgeon: [name], MD;  Location: [facility];  Service: Cardiovascular;  Laterality: N/A;    Family History  Problem Relation Age of Onset   Cancer Mother    Heart disease Mother    Heart Problems Mother        pacemaker   Hypertension Father     Social History   Socioeconomic History   Marital status: Married    Spouse name: Not on file   Number of children: Not on file   Years of education: Not on file   Highest education level: Not on file  Occupational History   Occupation: truck driver    Employer: [employer]  Tobacco Use   Smoking status: Every Day    Packs/day: 0.50    Years: 43.00    Pack years:  21.50    Types: Cigarettes    Start date: 1978   Smokeless tobacco: Never   Tobacco comments:    Pt has stopped and satated back  Vaping Use   Vaping Use: Never used  Substance and Sexual Activity   Alcohol use: Yes    Comment: social   Drug use: No   Sexual activity: Yes  Other Topics Concern   Not on file  Social History Narrative   Not on file   Social Determinants of Health   Financial Resource Strain: Not on file  Food Insecurity: Not on file  Transportation Needs: Not on file  Physical Activity: Not on file  Stress: Not on file  Social Connections: Not on file  Intimate Partner Violence: Not on file    Outpatient Medications Prior to Visit  Medication Sig Dispense Refill   Ascorbic Acid (VITAMIN C) 1000 MG tablet Take 1,000 mg by mouth daily.     cholecalciferol (VITAMIN D3) 25 MCG (1000 UNIT) tablet Take 2,000 Units by mouth daily.     fluticasone (FLONASE) 50 MCG/ACT nasal  spray Place into both nostrils as needed for allergies or rhinitis.     hydrochlorothiazide (HYDRODIURIL) 25 MG tablet Take 1 tablet (25 mg total) by mouth daily. 90 tablet 3   ibuprofen (ADVIL) 800 MG tablet Take by mouth.     Multiple Vitamins-Minerals (MULTIVITAMIN WITH MINERALS) tablet Take 1 tablet by mouth daily.     Vitamin D, Ergocalciferol, (DRISDOL) 1.25 MG (50000 UNIT) CAPS capsule Take 1 capsule (50,000 Units total) by mouth every 7 (seven) days. Take for 8 total doses(weeks) 8 capsule 0   No facility-administered medications prior to visit.    Allergies  Allergen Reactions   Lisinopril     Lip swelling    Review of Systems  All other systems reviewed and are negative.     Objective:    Physical Exam Vitals and nursing note reviewed.  Constitutional:      Appearance: Normal appearance.  Eyes:     Extraocular Movements: Extraocular movements intact.     Conjunctiva/sclera: Conjunctivae normal.     Pupils: Pupils are equal, round, and reactive to light.  Cardiovascular:     Rate and Rhythm: Normal rate.     Pulses: Normal pulses.          Dorsalis pedis pulses are 2+ on the right side.  Pulmonary:     Effort: Pulmonary effort is normal.  Musculoskeletal:        General: Swelling and tenderness present.     Cervical back: Normal range of motion.     Left lower leg: No edema.     Right foot: Decreased range of motion. No prominent metatarsal heads.       Feet:  Feet:     Right foot:     Skin integrity: Skin integrity normal.     Toenail Condition: Right toenails are abnormally thick and long.     Comments: Edema and tenderness to light palpation noted to the right great toe and metatarsals on both dorsal and plantar surface.  Skin:    General: Skin is warm and dry.     Capillary Refill: Capillary refill takes less than 2 seconds.  Neurological:     General: No focal deficit present.     Mental Status: He is alert and oriented to person, place, and time.   Psychiatric:        Mood and Affect: Mood normal.  Behavior: Behavior normal.        Thought Content: Thought content normal.        Judgment: Judgment normal.    BP (!) 140/100   Pulse 87   Ht 6' 1"  (1.854 m)   Wt 257 lb (116.6 kg)   SpO2 92%   BMI 33.91 kg/m  Wt Readings from Last 3 Encounters:  09/20/21 257 lb (116.6 kg)  08/24/21 258 lb 6.4 oz (117.2 kg)  07/07/21 253 lb (114.8 kg)    Health Maintenance Due  Topic Date Due   Pneumococcal Vaccine 60-69 Years old (1 - PCV) Never done   TETANUS/TDAP  Never done   Zoster Vaccines- Shingrix (1 of 2) Never done   COLONOSCOPY (Pts 45-51yr Insurance coverage will need to be confirmed)  Never done   COVID-19 Vaccine (3 - Pfizer risk series) 04/01/2020    There are no preventive care reminders to display for this patient.   Lab Results  Component Value Date   TSH 1.80 05/29/2015   Lab Results  Component Value Date   WBC 9.1 08/24/2021   HGB 14.4 08/24/2021   HCT 43.1 08/24/2021   MCV 85 08/24/2021   PLT 341 08/24/2021   Lab Results  Component Value Date   NA 137 08/24/2021   K 4.2 08/24/2021   CO2 22 08/24/2021   GLUCOSE 75 08/24/2021   BUN 11 08/24/2021   CREATININE 1.03 08/24/2021   BILITOT 0.3 08/24/2021   ALKPHOS 150 (H) 08/24/2021   AST 22 08/24/2021   ALT 23 08/24/2021   PROT 7.6 08/24/2021   ALBUMIN 4.3 08/24/2021   CALCIUM 9.9 08/24/2021   ANIONGAP 9 11/27/2020   EGFR 84 08/24/2021   GFR 95.81 05/29/2015   Lab Results  Component Value Date   CHOL 215 (H) 08/24/2021   Lab Results  Component Value Date   HDL 42 08/24/2021   Lab Results  Component Value Date   LDLCALC 126 (H) 08/24/2021   Lab Results  Component Value Date   TRIG 264 (H) 08/24/2021   Lab Results  Component Value Date   CHOLHDL 5.1 (H) 08/24/2021   Lab Results  Component Value Date   HGBA1C 6.4 (H) 08/24/2021       Assessment & Plan:   Problem List Items Addressed This Visit     Foot pain, right -  Primary    Injury to the right great toe with suspected fracture of the phalanges and metatarsals. Significant edema is present with ecchymosis present.  Recommend x-ray for evaluation to ensure proper alignment of bones.  Patient placed in foot cast/post-op shoe and provided with instructions on ice, elevation, and rest.  Tramadol for pain with strict precautions on use while driving.  Will monitor for results and make changes to plan of care as necessary. Otherwise, follow-up in 3-4 weeks for re-evaluation.  Plan to start therapy once injury has healed.       Relevant Medications   traMADol (ULTRAM) 50 MG tablet   Other Relevant Orders   DG Foot Complete Right     Meds ordered this encounter  Medications   traMADol (ULTRAM) 50 MG tablet    Sig: Take 1 tablet (50 mg total) by mouth every 8 (eight) hours as needed for up to 5 days.    Dispense:  15 tablet    Refill:  0     SOrma Render NP

## 2021-09-21 ENCOUNTER — Telehealth (HOSPITAL_BASED_OUTPATIENT_CLINIC_OR_DEPARTMENT_OTHER): Payer: Self-pay

## 2021-09-21 NOTE — Telephone Encounter (Signed)
Received a call from [facility]iology about this patients xray's that were taken yesterday Per Radiologist there is a "small acute mildly displaced intraarticular fracture at the base of the 1st proximal phalanges" Will route to Worthy Keeler, DNP for advisement

## 2021-09-29 ENCOUNTER — Ambulatory Visit (AMBULATORY_SURGERY_CENTER): Payer: BC Managed Care – PPO | Admitting: *Deleted

## 2021-09-29 ENCOUNTER — Other Ambulatory Visit: Payer: Self-pay

## 2021-09-29 VITALS — Ht 73.0 in | Wt 257.0 lb

## 2021-09-29 DIAGNOSIS — Z1211 Encounter for screening for malignant neoplasm of colon: Secondary | ICD-10-CM

## 2021-09-29 MED ORDER — PEG 3350-KCL-NA BICARB-NACL 420 G PO SOLR: 4000.0000 mL | Freq: Once | ORAL | 0 refills | Status: AC

## 2021-09-29 NOTE — Progress Notes (Signed)

## 2021-10-01 DIAGNOSIS — S92411A Displaced fracture of proximal phalanx of right great toe, initial encounter for closed fracture: Secondary | ICD-10-CM | POA: Insufficient documentation

## 2021-10-05 ENCOUNTER — Telehealth: Payer: Self-pay | Admitting: Gastroenterology

## 2021-10-05 ENCOUNTER — Other Ambulatory Visit: Payer: Self-pay

## 2021-10-05 ENCOUNTER — Telehealth: Payer: Self-pay

## 2021-10-05 NOTE — Telephone Encounter (Signed)
Patient is informed.

## 2021-10-05 NOTE — Telephone Encounter (Signed)
Pre-operative Risk Assessment     Request for surgical clearance:     Endoscopy Procedure  What type of surgery is being performed?     Colonoscopy   When is this surgery scheduled?     10/13/21  What type of clearance is required ?  Medical - could the patient potentially be a difficult intubation due to his cervical neck injury?   Practice name and name of physician performing surgery?      [facility] Dr Kavitha Nandigam, MD   What is your office phone and fax number?      Phone- [phone]  Fax- [phone]  Anesthesia type (None, local, MAC, general) ?       MAC    Thank you for your attention to this concern. 

## 2021-10-05 NOTE — Telephone Encounter (Signed)
Ok [employer].  Beth, please advise patient to proceed with prep as per instructions

## 2021-10-05 NOTE — Telephone Encounter (Signed)
Patient is having an injection "similar to a joint injection" for neck pain. He is scheduled for a colonoscopy 10/13/21. Any contraindications?

## 2021-10-05 NOTE — Telephone Encounter (Signed)
I have reached out to the specialist, Dr Maia Petties at the [facility]  Phone [phone] fax [phone]

## 2021-10-05 NOTE — Telephone Encounter (Signed)
Inbound call from patient, states that he is having a shot in his neck tomorrow. Wanted to speak with someone to make sure that the shot will not interferer with his procedure next week. Please advise.

## 2021-10-05 NOTE — Telephone Encounter (Signed)
Can you please check what exactly he getting done and if he has any significant arthritis or deformity of C-spine. I am ccing Osvaldo Angst

## 2021-10-05 NOTE — Telephone Encounter (Signed)
Pre-operative Risk Assessment     Request for surgical clearance:     Endoscopy Procedure  What type of surgery is being performed?     Colonoscopy   When is this surgery scheduled?     10/13/21  What type of clearance is required ?  Medical - could the patient potentially be a difficult intubation due to his cervical neck injury?   Practice name and name of physician performing surgery?      [facility] Dr Harl Bowie, MD   What is your office phone and fax number?      Phone- [phone]  [phone]  Anesthesia type (None, local, MAC, general) ?       MAC    Thank you for your attention to this concern.

## 2021-10-06 ENCOUNTER — Encounter: Payer: Self-pay | Admitting: Gastroenterology

## 2021-10-06 ENCOUNTER — Ambulatory Visit: Payer: BC Managed Care – PPO | Admitting: Nurse Practitioner

## 2021-10-08 ENCOUNTER — Ambulatory Visit (INDEPENDENT_AMBULATORY_CARE_PROVIDER_SITE_OTHER): Payer: BC Managed Care – PPO

## 2021-10-08 ENCOUNTER — Telehealth: Payer: Self-pay | Admitting: Gastroenterology

## 2021-10-08 DIAGNOSIS — I441 Atrioventricular block, second degree: Secondary | ICD-10-CM | POA: Diagnosis not present

## 2021-10-08 NOTE — Telephone Encounter (Signed)
Spoke with the patient. He does not start the prep until Tuesday. Reviewed the instructions and the low residue diet that starts today. He will access My Chart and review the instructions when he is off the road. No further questions at this time.

## 2021-10-08 NOTE — Telephone Encounter (Signed)
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 He is wondering if he could take the prep on Sunday due to the hold up. Seeking advise.   [phone]

## 2021-10-12 ENCOUNTER — Encounter: Payer: BC Managed Care – PPO | Admitting: Internal Medicine

## 2021-10-12 DIAGNOSIS — I441 Atrioventricular block, second degree: Secondary | ICD-10-CM

## 2021-10-13 ENCOUNTER — Encounter: Payer: Self-pay | Admitting: Gastroenterology

## 2021-10-13 ENCOUNTER — Ambulatory Visit (AMBULATORY_SURGERY_CENTER): Payer: BC Managed Care – PPO | Admitting: Gastroenterology

## 2021-10-13 ENCOUNTER — Other Ambulatory Visit: Payer: Self-pay

## 2021-10-13 VITALS — BP 143/74 | HR 62 | Temp 98.6°F | Resp 14 | Ht 73.0 in | Wt 257.0 lb

## 2021-10-13 DIAGNOSIS — D123 Benign neoplasm of transverse colon: Secondary | ICD-10-CM | POA: Diagnosis not present

## 2021-10-13 DIAGNOSIS — Z1211 Encounter for screening for malignant neoplasm of colon: Secondary | ICD-10-CM

## 2021-10-13 DIAGNOSIS — D125 Benign neoplasm of sigmoid colon: Secondary | ICD-10-CM

## 2021-10-13 DIAGNOSIS — D122 Benign neoplasm of ascending colon: Secondary | ICD-10-CM | POA: Diagnosis not present

## 2021-10-13 DIAGNOSIS — D124 Benign neoplasm of descending colon: Secondary | ICD-10-CM

## 2021-10-13 LAB — CUP PACEART REMOTE DEVICE CHECK
Battery Remaining Longevity: 114 mo
Battery Remaining Percentage: 95.5 %
Battery Voltage: 3.02 V
Brady Statistic AP VP Percent: 1 %
Brady Statistic AP VS Percent: 1 %
Brady Statistic AS VP Percent: 12 %
Brady Statistic AS VS Percent: 86 %
Brady Statistic RA Percent Paced: 1.4 %
Brady Statistic RV Percent Paced: 13 %
Date Time Interrogation Session: 20221214114430
Implantable Lead Implant Date: 19990719
Implantable Lead Implant Date: 19990719
Implantable Lead Location: 753859
Implantable Lead Location: 753860
Implantable Pulse Generator Implant Date: 20220907
Lead Channel Impedance Value: 380 Ohm
Lead Channel Impedance Value: 400 Ohm
Lead Channel Pacing Threshold Amplitude: 0.5 V
Lead Channel Pacing Threshold Amplitude: 0.5 V
Lead Channel Pacing Threshold Pulse Width: 0.5 ms
Lead Channel Pacing Threshold Pulse Width: 0.5 ms
Lead Channel Sensing Intrinsic Amplitude: 10.3 mV
Lead Channel Sensing Intrinsic Amplitude: 3.2 mV
Lead Channel Setting Pacing Amplitude: 2 V
Lead Channel Setting Pacing Amplitude: 2.5 V
Lead Channel Setting Pacing Pulse Width: 0.5 ms
Lead Channel Setting Sensing Sensitivity: 2 mV
Pulse Gen Model: 2272
Pulse Gen Serial Number: 6519619

## 2021-10-13 MED ORDER — SODIUM CHLORIDE 0.9 % IV SOLN: 500.0000 mL | Freq: Once | INTRAVENOUS | Status: DC

## 2021-10-13 NOTE — Op Note (Signed)
[facility] Patient Name: [name] Fields Procedure Date: 10/13/2021 9:47 AM MRN: [id number] Endoscopist: Mauri Pole , MD Age: 60 Referring MD:  Date of Birth: 10-Aug-1961 Gender: Male Account #: [id number] Procedure:                Colonoscopy Indications:              Screening for colorectal malignant neoplasm Medicines:                Monitored Anesthesia Care Procedure:                Pre-Anesthesia Assessment:                           - Prior to the procedure, a History and Physical                            was performed, and patient medications and                            allergies were reviewed. The patient's tolerance of                            previous anesthesia was also reviewed. The risks                            and benefits of the procedure and the sedation                            options and risks were discussed with the patient.                            All questions were answered, and informed consent                            was obtained. Prior Anticoagulants: The patient has                            taken no previous anticoagulant or antiplatelet                            agents. ASA Grade Assessment: III - A patient with                            severe systemic disease. After reviewing the risks                            and benefits, the patient was deemed in                            satisfactory condition to undergo the procedure.                           After obtaining informed consent, the colonoscope  was passed under direct vision. Throughout the                            procedure, the patient's blood pressure, pulse, and                            oxygen saturations were monitored continuously. The                            Olympus PCF-H190DL ([id number]) Colonoscope was                            introduced through the anus and advanced to the the                            cecum,  identified by appendiceal orifice and                            ileocecal valve. The colonoscopy was performed                            without difficulty. The patient tolerated the                            procedure well. The quality of the bowel                            preparation was adequate. The ileocecal valve,                            appendiceal orifice, and rectum were photographed. Scope In: 9:58:38 AM Scope Out: 10:17:43 AM Scope Withdrawal Time: 0 hours 13 minutes 9 seconds  Total Procedure Duration: 0 hours 19 minutes 5 seconds  Findings:                 The perianal and digital rectal examinations were                            normal.                           Six sessile polyps were found in the sigmoid colon                            X 1, descending colon X1 , transverse colon X2 and                            ascending colon X2. The polyps were 5 to 11 mm in                            size. These polyps were removed with a cold snare.                            Resection and retrieval were complete.  A few small-mouthed diverticula were found in the                            sigmoid colon, descending colon and ascending colon.                           Non-bleeding internal hemorrhoids were found during                            retroflexion. The hemorrhoids were medium-sized. Complications:            No immediate complications. Estimated Blood Loss:     Estimated blood loss was minimal. Impression:               - Six 5 to 11 mm polyps in the sigmoid colon, in                            the descending colon, in the transverse colon and                            in the ascending colon, removed with a cold snare.                            Resected and retrieved.                           - Diverticulosis in the sigmoid colon, in the                            descending colon and in the ascending colon.                            - Non-bleeding internal hemorrhoids. Recommendation:           - Patient has a contact number available for                            emergencies. The signs and symptoms of potential                            delayed complications were discussed with the                            patient. Return to normal activities tomorrow.                            Written discharge instructions were provided to the                            patient.                           - Resume previous diet.                           - Continue present  medications.                           - Await pathology results.                           - Repeat colonoscopy in 3 - 5 years for                            surveillance based on pathology results. Mauri Pole, MD 10/13/2021 10:26:07 AM This report has been signed electronically.

## 2021-10-13 NOTE — Patient Instructions (Addendum)
Read all of the handouts given to you by your recovery room nurse.  YOU HAD AN ENDOSCOPIC PROCEDURE TODAY AT THE [facility]:   Refer to the procedure report that was given to you for any specific questions about what was found during the examination.  If the procedure report does not answer your questions, please call your gastroenterologist to clarify.  If you requested that your care partner not be given the details of your procedure findings, then the procedure report has been included in a sealed envelope for you to review at your convenience later.  YOU SHOULD EXPECT: Some feelings of bloating in the abdomen. Passage of more gas than usual.  Walking can help get rid of the air that was put into your GI tract during the procedure and reduce the bloating. If you had a lower endoscopy (such as a colonoscopy or flexible sigmoidoscopy) you may notice spotting of blood in your stool or on the toilet paper. If you underwent a bowel prep for your procedure, you may not have a normal bowel movement for a few days.  Please Note:  You might notice some irritation and congestion in your nose or some drainage.  This is from the oxygen used during your procedure.  There is no need for concern and it should clear up in a day or so.  SYMPTOMS TO REPORT IMMEDIATELY:  Following lower endoscopy (colonoscopy or flexible sigmoidoscopy):  Excessive amounts of blood in the stool  Significant tenderness or worsening of abdominal pains  Swelling of the abdomen that is new, acute  Fever of 100F or higher   For urgent or emergent issues, a gastroenterologist can be reached at any hour by calling [phone]. Do not use MyChart messaging for urgent concerns.    DIET:  We do recommend a small meal at first, but then you may proceed to your regular diet.  Drink plenty of fluids but you should avoid alcoholic beverages for 24 hours. Try to increase the fiber in your diet, and drink plenty of  water.  ACTIVITY:  You should plan to take it easy for the rest of today and you should NOT DRIVE or use heavy machinery until tomorrow (because of the sedation medicines used during the test).    FOLLOW UP: Our staff will call the number listed on your records 48-72 hours following your procedure to check on you and address any questions or concerns that you may have regarding the information given to you following your procedure. If we do not reach you, we will leave a message.  We will attempt to reach you two times.  During this call, we will ask if you have developed any symptoms of COVID 19. If you develop any symptoms (ie: fever, flu-like symptoms, shortness of breath, cough etc.) before then, please call [phone].  If you test positive for Covid 19 in the 2 weeks post procedure, please call and report this information to us.    If any biopsies were taken you will be contacted by phone or by letter within the next 1-3 weeks.  Please call us at [phone] if you have not heard about the biopsies in 3 weeks.    SIGNATURES/CONFIDENTIALITY: You and/or your care partner have signed paperwork which will be entered into your electronic medical record.  These signatures attest to the fact that that the information above on your After Visit Summary has been reviewed and is understood.  Full responsibility of the confidentiality of this   discharge information lies with you and/or your care-partner.  

## 2021-10-13 NOTE — Progress Notes (Signed)
C.W. vital signs. 

## 2021-10-13 NOTE — Progress Notes (Signed)
[facility] History and Physical   Primary Care Physician:  Early, Coralee Pesa, NP   Reason for Procedure:  Colorectal cancer screening  Plan:    Screening colonoscopy with possible interventions as needed     HPI: [name] Fields is a very pleasant 60 y.o. male here for screening colonoscopy. Denies any nausea, vomiting, abdominal pain, melena or bright red blood per rectum  The risks and benefits as well as alternatives of endoscopic procedure(s) have been discussed and reviewed. All questions answered. The patient agrees to proceed.    Past Medical History:  Diagnosis Date   Allergy to ACE inhibitors    Atrial tachycardia (HCC)    Cardiomyopathy, nonischemic (HCC)    GERD (gastroesophageal reflux disease)    Hiatal hernia    High-grade second-degree heart block     Qualifier: Diagnosis of  By: Caryl Comes, MD, Remus Blake    Hypertension    Permanent pacemaker-[employer] [place]; gen re in the summer he has o pain syndrome BP 135% at time long so is his we have been short of his device is in a in a in a Foley disease since the stents removed about 3.0-4 seconds on the episode the patient is very 59 and a [place] when his pacemaker but when he and his placement 2011   Presence of permanent cardiac pacemaker    Sleep apnea    uses CPAP sometimes   WPW (Wolff-Parkinson-White syndrome)    s/pRFCA    Past Surgical History:  Procedure Laterality Date   CARDIAC DEFIBRILLATOR PLACEMENT  2011   St Jude -- [employer] RF   CARPAL TUNNEL RELEASE Left 08/18/2020   Procedure: CARPAL TUNNEL RELEASE;  Surgeon: Daryll Brod, MD;  Location: [facility];  Service: Orthopedics;  Laterality: Left;  IV REGIONAL FOREARM BLOCK   CARPAL TUNNEL RELEASE Right 12/01/2020   Procedure: CARPAL TUNNEL RELEASE;  Surgeon: Daryll Brod, MD;  Location: [facility];  Service: Orthopedics;  Laterality: Right;  IV REGIONAL FOREARM BLOCK   PACEMAKER INSERTION  1999    explanted 2011   PPM GENERATOR CHANGEOUT N/A 07/07/2021   Procedure: PPM GENERATOR CHANGEOUT;  Surgeon: [name], MD;  Location: [facility];  Service: Cardiovascular;  Laterality: N/A;    Prior to Admission medications   Medication Sig Start Date End Date Taking? Authorizing Provider  ergocalciferol (VITAMIN D2) 1.25 MG (50000 UT) capsule ergocalciferol (vitamin D2) 1,250 mcg (50,000 unit) capsule  TAKE 1 CAPSULE BY MOUTH ONCE A WEEK. TAKE FOR 8 TOTAL DOSES (WEEKS)   Yes [provider]  hydrochlorothiazide (HYDRODIURIL) 25 MG tablet Take 1 tablet (25 mg total) by mouth daily. 03/12/21  Yes [name], MD  Ascorbic Acid (VITAMIN C) 1000 MG tablet Take 1,000 mg by mouth daily.    [provider]  fluticasone (FLONASE) 50 MCG/ACT nasal spray Place into both nostrils as needed for allergies or rhinitis.    [provider]  traMADol (ULTRAM) 50 MG tablet Take by mouth every 6 (six) hours as needed.    [provider]    Current Outpatient Medications  Medication Sig Dispense Refill   ergocalciferol (VITAMIN D2) 1.25 MG (50000 UT) capsule ergocalciferol (vitamin D2) 1,250 mcg (50,000 unit) capsule  TAKE 1 CAPSULE BY MOUTH ONCE A WEEK. TAKE FOR 8 TOTAL DOSES (WEEKS)     hydrochlorothiazide (HYDRODIURIL) 25 MG tablet Take 1 tablet (25 mg total) by mouth daily. 90 tablet  3   Ascorbic Acid (VITAMIN C) 1000 MG tablet Take 1,000 mg by mouth daily.     fluticasone (FLONASE) 50 MCG/ACT nasal spray Place into both nostrils as needed for allergies or rhinitis.     traMADol (ULTRAM) 50 MG tablet Take by mouth every 6 (six) hours as needed.     Current Facility-Administered Medications  Medication Dose Route Frequency Provider Last Rate Last Admin   0.9 %  sodium chloride infusion  500 mL Intravenous Once Mauri Pole, MD        Allergies as of 10/13/2021 - Review Complete 10/13/2021  Allergen Reaction Noted   Lisinopril  11/25/2020     Family History  Problem Relation Age of Onset   Breast cancer Mother    Heart disease Mother    Heart Problems Mother        pacemaker   Prostate cancer Father    Hypertension Father     Social History   Socioeconomic History   Marital status: Married    Spouse name: Not on file   Number of children: Not on file   Years of education: Not on file   Highest education level: Not on file  Occupational History   Occupation: truck driver    Employer: [employer]  Tobacco Use   Smoking status: Every Day    Packs/day: 0.50    Years: 43.00    Pack years: 21.50    Types: Cigarettes    Start date: 1978   Smokeless tobacco: Never   Tobacco comments:    Pt has stopped and satated back  Vaping Use   Vaping Use: Never used  Substance and Sexual Activity   Alcohol use: Yes    Alcohol/week: 7.0 standard drinks    Types: 7 Standard drinks or equivalent per week    Comment: social   Drug use: No   Sexual activity: Yes  Other Topics Concern   Not on file  Social History Narrative   Not on file   Social Determinants of Health   Financial Resource Strain: Not on file  Food Insecurity: Not on file  Transportation Needs: Not on file  Physical Activity: Not on file  Stress: Not on file  Social Connections: Not on file  Intimate Partner Violence: Not on file    Review of Systems:  All other review of systems negative except as mentioned in the HPI.  Physical Exam: Vital signs in last 24 hours: BP 128/85    Pulse 70    Temp 98.6 F (37 C)    Ht 6\' 1"  (1.854 m)    Wt 257 lb (116.6 kg)    SpO2 98%    BMI 33.91 kg/m  General:   Alert, NAD Lungs:  Clear .   Heart:  Regular rate and rhythm Abdomen:  Soft, nontender and nondistended. Neuro/Psych:  Alert and cooperative. Normal mood and affect. A and O x 3  Reviewed labs, radiology imaging, old records and pertinent past GI work up  Patient is appropriate for planned procedure(s) and anesthesia in an ambulatory  setting   K. Denzil Magnuson , MD [phone]

## 2021-10-13 NOTE — Progress Notes (Signed)
Called to room to assist during endoscopic procedure.  Patient ID and intended procedure confirmed with present staff. Received instructions for my participation in the procedure from the performing physician.  

## 2021-10-13 NOTE — Progress Notes (Signed)
Pt's states no medical or surgical changes since previsit or office visit. 

## 2021-10-13 NOTE — Progress Notes (Signed)
To PACU VSS. Report to [email or web address] 

## 2021-10-14 ENCOUNTER — Ambulatory Visit: Payer: BC Managed Care – PPO | Admitting: Internal Medicine

## 2021-10-14 ENCOUNTER — Encounter: Payer: Self-pay | Admitting: Internal Medicine

## 2021-10-14 VITALS — BP 128/80 | HR 88 | Ht 72.0 in | Wt 257.0 lb

## 2021-10-14 DIAGNOSIS — I440 Atrioventricular block, first degree: Secondary | ICD-10-CM | POA: Diagnosis not present

## 2021-10-14 DIAGNOSIS — I429 Cardiomyopathy, unspecified: Secondary | ICD-10-CM | POA: Diagnosis not present

## 2021-10-14 DIAGNOSIS — I441 Atrioventricular block, second degree: Secondary | ICD-10-CM

## 2021-10-14 DIAGNOSIS — Z950 Presence of cardiac pacemaker: Secondary | ICD-10-CM | POA: Diagnosis not present

## 2021-10-14 DIAGNOSIS — I517 Cardiomegaly: Secondary | ICD-10-CM | POA: Diagnosis not present

## 2021-10-14 LAB — CUP PACEART INCLINIC DEVICE CHECK
Battery Remaining Longevity: 129 mo
Battery Voltage: 3.02 V
Brady Statistic RA Percent Paced: 1.3 %
Brady Statistic RV Percent Paced: 13 %
Date Time Interrogation Session: 20221215162628
Implantable Lead Implant Date: 19990719
Implantable Lead Implant Date: 19990719
Implantable Lead Location: 753859
Implantable Lead Location: 753860
Implantable Pulse Generator Implant Date: 20220907
Lead Channel Impedance Value: 375 Ohm
Lead Channel Impedance Value: 375 Ohm
Lead Channel Pacing Threshold Amplitude: 0.5 V
Lead Channel Pacing Threshold Amplitude: 0.5 V
Lead Channel Pacing Threshold Amplitude: 1 V
Lead Channel Pacing Threshold Amplitude: 1 V
Lead Channel Pacing Threshold Pulse Width: 0.5 ms
Lead Channel Pacing Threshold Pulse Width: 0.5 ms
Lead Channel Pacing Threshold Pulse Width: 0.5 ms
Lead Channel Pacing Threshold Pulse Width: 0.5 ms
Lead Channel Sensing Intrinsic Amplitude: 10.8 mV
Lead Channel Sensing Intrinsic Amplitude: 5 mV
Lead Channel Setting Pacing Amplitude: 2 V
Lead Channel Setting Pacing Amplitude: 2.5 V
Lead Channel Setting Pacing Pulse Width: 0.5 ms
Lead Channel Setting Sensing Sensitivity: 2 mV
Pulse Gen Model: 2272
Pulse Gen Serial Number: 6519619

## 2021-10-14 NOTE — Patient Instructions (Signed)
Medication Instructions:  Your physician recommends that you continue on your current medications as directed. Please refer to the Current Medication list given to you today.  You may take Prilosec OTC   *If you need a refill on your cardiac medications before your next appointment, please call your pharmacy*   Lab Work: None ordered.  If you have labs (blood work) drawn today and your tests are completely normal, you will receive your results only by: [facility] (if you have MyChart) OR A paper copy in the mail If you have any lab test that is abnormal or we need to change your treatment, we will call you to review the results.   Testing/Procedures: None ordered.    Follow-Up: At [facility], you and your health needs are our priority.  As part of our continuing mission to provide you with exceptional heart care, we have created designated Provider Care Teams.  These Care Teams include your primary Cardiologist (physician) and Advanced Practice Providers (APPs -  Physician Assistants and Nurse Practitioners) who all work together to provide you with the care you need, when you need it.  We recommend signing up for the patient portal called "MyChart".  Sign up information is provided on this After Visit Summary.  MyChart is used to connect with patients for Virtual Visits (Telemedicine).  Patients are able to view lab/test results, encounter notes, upcoming appointments, etc.  Non-urgent messages can be sent to your provider as well.   To learn more about what you can do with MyChart, go to [email or web address].    Your next appointment:   9 months per Dr Caryl Comes

## 2021-10-14 NOTE — Progress Notes (Signed)
Patient Care Team: Early, Coralee Pesa, NP as PCP - General (Nurse Practitioner) [name], MD as PCP - Sleep Medicine (Cardiology) [name], MD as PCP - Electrophysiology (Cardiology) [name], MD as Attending Physician (Cardiology)   HPI  [name] Fields is a 60 y.o. male Seen in followup for pacemaker implanted 1999 for high-grade heart block for which he underwent generator replacement in 7/11 and then again 9/22 Vp 19 % >>Vp 9%    The patient denies, shortness of breath, nocturnal dyspnea, orthopnea or peripheral edema.  There have been no palpitations or syncope.  Complains of a chest discomfort that is midsternal response to rubbing typically last about 10 minutes unassociated with exertion.  Associated temporally with a brackish taste in the back of his mouth.  Feels like his pacemaker is a little bit different.Marland Kitchen        DATE TEST EF   10/14 Echo  60-65%   7/21 Myoview 52% No ischemia  6/22 Echo  60-65%        Date Cr K Hgb  7/16 1.04 3.9    5/19 1.26 4.6    1/22 1.16 4.0 14.0 (7/21)  10/22 1.03 4.2 14.4     Past Medical History:  Diagnosis Date   Allergy to ACE inhibitors    Atrial tachycardia (HCC)    Cardiomyopathy, nonischemic (HCC)    GERD (gastroesophageal reflux disease)    Hiatal hernia    High-grade second-degree heart block     Qualifier: Diagnosis of  By: Caryl Comes, MD, Remus Blake    Hypertension    Permanent pacemaker-[employer] Jude     DOI 1999; gen re in the summer he has o pain syndrome BP 135% at time long so is his we have been short of his device is in a in a in a Foley disease since the stents removed about 3.0-4 seconds on the episode the patient is very 33 and a [place] when his pacemaker but when he and his placement 2011   Presence of permanent cardiac pacemaker    Sleep apnea    uses CPAP sometimes   WPW (Wolff-Parkinson-White syndrome)    s/pRFCA    Past Surgical History:  Procedure Laterality Date   CARDIAC  DEFIBRILLATOR PLACEMENT  2011   St Jude -- [employer] RF   CARPAL TUNNEL RELEASE Left 08/18/2020   Procedure: CARPAL TUNNEL RELEASE;  Surgeon: Daryll Brod, MD;  Location: [facility];  Service: Orthopedics;  Laterality: Left;  IV REGIONAL FOREARM BLOCK   CARPAL TUNNEL RELEASE Right 12/01/2020   Procedure: CARPAL TUNNEL RELEASE;  Surgeon: Daryll Brod, MD;  Location: [facility];  Service: Orthopedics;  Laterality: Right;  IV REGIONAL FOREARM BLOCK   PACEMAKER INSERTION  1999   explanted 2011   PPM GENERATOR CHANGEOUT N/A 07/07/2021   Procedure: PPM GENERATOR CHANGEOUT;  Surgeon: [name], MD;  Location: [facility];  Service: Cardiovascular;  Laterality: N/A;    Current Outpatient Medications  Medication Sig Dispense Refill   Ascorbic Acid (VITAMIN C) 1000 MG tablet Take 1,000 mg by mouth daily.     ergocalciferol (VITAMIN D2) 1.25 MG (50000 UT) capsule ergocalciferol (vitamin D2) 1,250 mcg (50,000 unit) capsule  TAKE 1 CAPSULE BY MOUTH ONCE A WEEK. TAKE FOR 8 TOTAL DOSES (WEEKS)     fluticasone (FLONASE) 50 MCG/ACT nasal spray Place into both nostrils as needed for allergies or rhinitis.     hydrochlorothiazide (  HYDRODIURIL) 25 MG tablet Take 1 tablet (25 mg total) by mouth daily. 90 tablet 3   traMADol (ULTRAM) 50 MG tablet Take by mouth every 6 (six) hours as needed.     No current facility-administered medications for this visit.    Allergies  Allergen Reactions   Lisinopril     Lip swelling    Review of Systems negative except from HPI and PMH  Physical Exam BP 128/80    Pulse 88    Ht 6' (1.829 m)    Wt 257 lb (116.6 kg)    SpO2 95%    BMI 34.86 kg/m  Well developed and well nourished in no acute distress HENT normal Neck supple with JVP-flat Clear Device pocket well healed; without hematoma or erythema.  There is no tethering  Regular rate and rhythm, no  gallop No / murmur Abd-soft with active BS No Clubbing cyanosis  edema Skin-warm  and dry A & Oriented  Grossly normal sensory and motor function  ECG sinus at 88 Intervals 19/11/36  Assessment and  Plan  2AVBlock intermittent  Pacemaker St Jude                                                                                                                                                                                                                       Cardiomyopathy-resolved-- LV hypertrophy  Hypertension -well-controlled  Chest pain-atypical/brackish taste    Blood pressure is well controlled.  Continue him on hydrochlorothiazide 25 mg a day.  Chest pain syndrome is atypical.  With brackish taste suspect it is GE reflux disease we will treat with Prilosec  Reviewed device programming unchanged from prior to reimplantation  Some brief pains over his device pocket.  No evidence of infection.

## 2021-10-15 ENCOUNTER — Telehealth: Payer: Self-pay

## 2021-10-15 ENCOUNTER — Encounter: Payer: Self-pay | Admitting: Gastroenterology

## 2021-10-15 NOTE — Telephone Encounter (Signed)
°  Follow up Call-  Call back number 10/13/2021 08/23/2021 09/03/2019 03/26/2019  Post procedure Call Back phone  # [phone]  Permission to leave phone message Yes - - -  Some recent data might be hidden     Patient questions:  Do you have a fever, pain , or abdominal swelling? No. Pain Score  0 *  Have you tolerated food without any problems? Yes.    Have you been able to return to your normal activities? Yes.    Do you have any questions about your discharge instructions: Diet   No. Medications  No. Follow up visit  No.  Do you have questions or concerns about your Care? No.  Actions: * If pain score is 4 or above: No action needed, pain <4.

## 2021-10-18 ENCOUNTER — Ambulatory Visit (INDEPENDENT_AMBULATORY_CARE_PROVIDER_SITE_OTHER): Payer: BC Managed Care – PPO | Admitting: Nurse Practitioner

## 2021-10-18 ENCOUNTER — Encounter (HOSPITAL_BASED_OUTPATIENT_CLINIC_OR_DEPARTMENT_OTHER): Payer: Self-pay

## 2021-10-18 DIAGNOSIS — S92411A Displaced fracture of proximal phalanx of right great toe, initial encounter for closed fracture: Secondary | ICD-10-CM | POA: Insufficient documentation

## 2021-10-18 DIAGNOSIS — Z91199 Patient's noncompliance with other medical treatment and regimen due to unspecified reason: Secondary | ICD-10-CM

## 2021-10-18 NOTE — Progress Notes (Signed)
Remote pacemaker transmission.   

## 2021-10-18 NOTE — Patient Instructions (Signed)
Patient not seen for f/u

## 2021-10-18 NOTE — Progress Notes (Signed)
Patient not seen today

## 2021-10-19 IMAGING — XA DG MYELOGRAPHY LUMBAR INJ CERVICAL
11 series · 11 of 11 positions shown · non-contrast
Comparison: CT cervical myelogram dated March 26, 2019.

CLINICAL DATA: Progressively worsening posterior neck pain
radiating into both shoulders and arms with new bilateral thumb and
second through fourth finger numbness. Prior cervical fusion.
TECHNIQUE: Contiguous axial images were obtained through the cervical spine
after the intrathecal infusion of contrast. Coronal and sagittal
reconstructions were obtained of the axial image sets.

[Series 1: vasc adipose · 1 of 1 slices shown (1 of 11)]
[im 1/1]
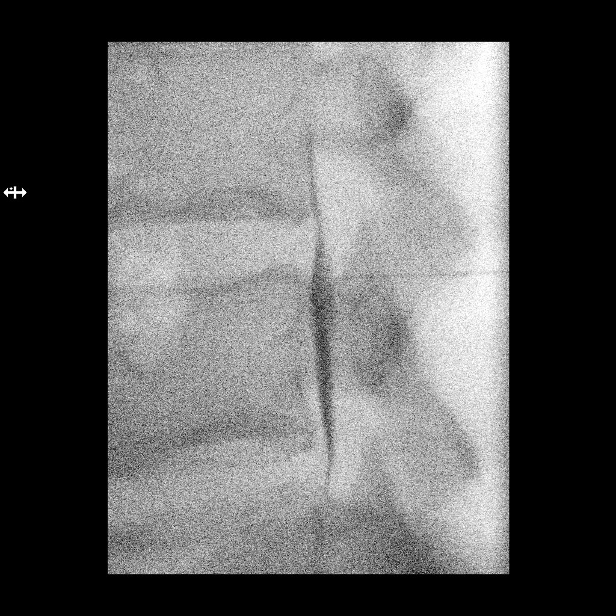

[Series 2: vasc adipose · 1 of 1 slices shown (2 of 11)]
[im 1/1]
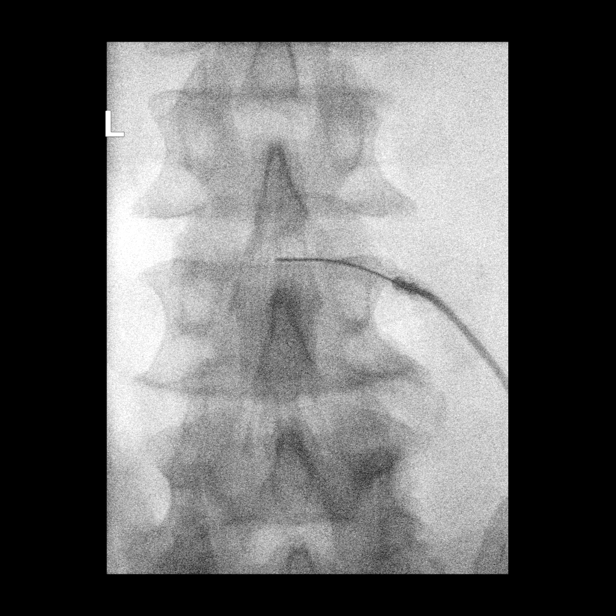

[Series 6: vasc adipose · 1 of 1 slices shown (3 of 11)]
[im 1/1]
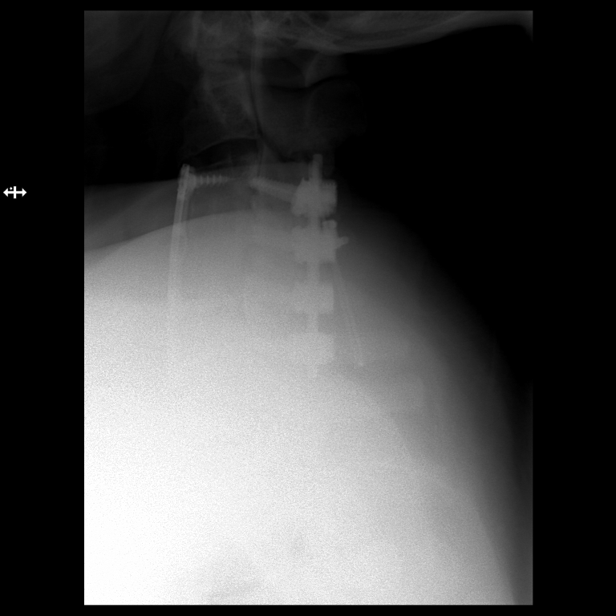

[Series 7: vasc adipose · 1 of 1 slices shown (4 of 11)]
[im 1/1]
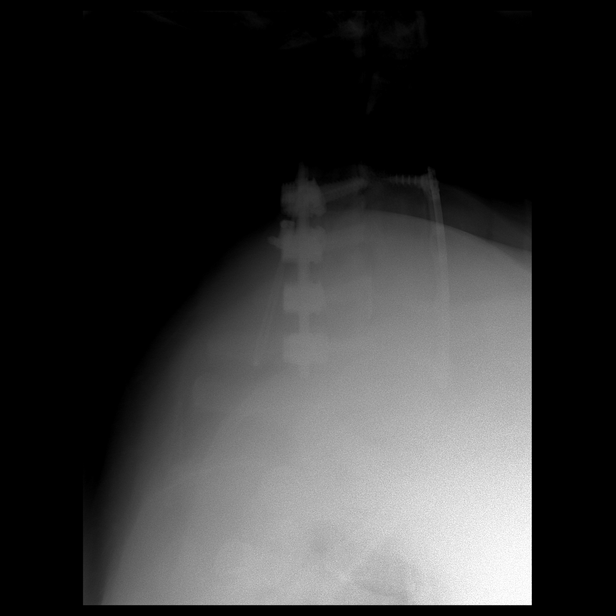

[Series 8: vasc adipose · 1 of 1 slices shown (5 of 11)]
[im 1/1]
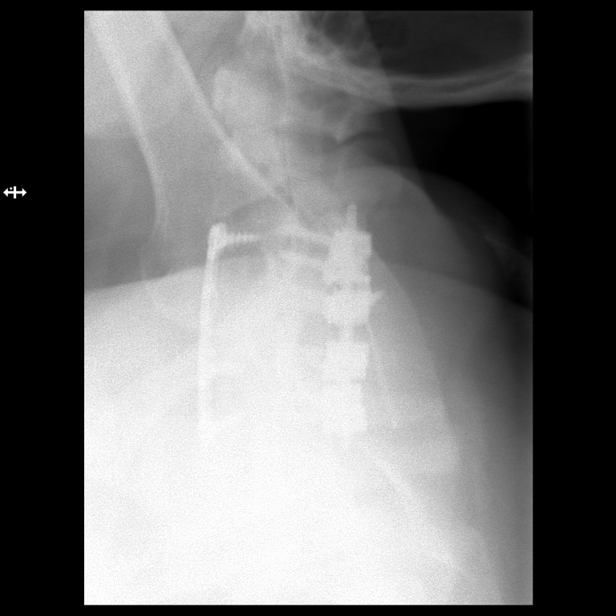

[Series 9: vasc adipose · 1 of 1 slices shown (6 of 11)]
[im 1/1]
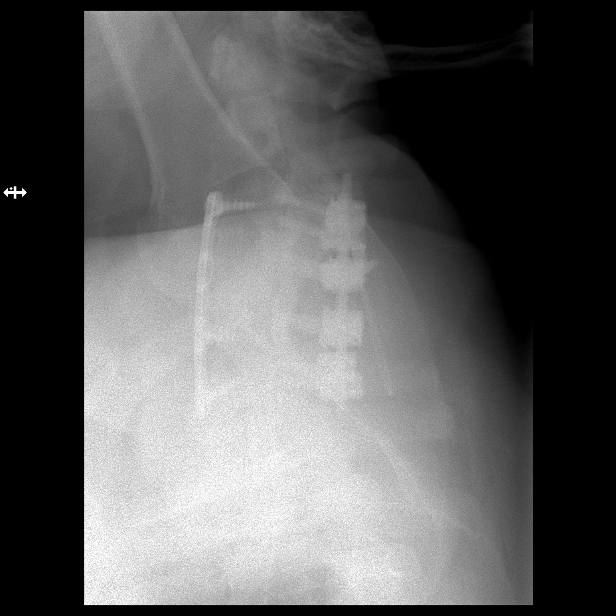

[Series 10: vasc adipose · 1 of 1 slices shown (7 of 11)]
[im 1/1]
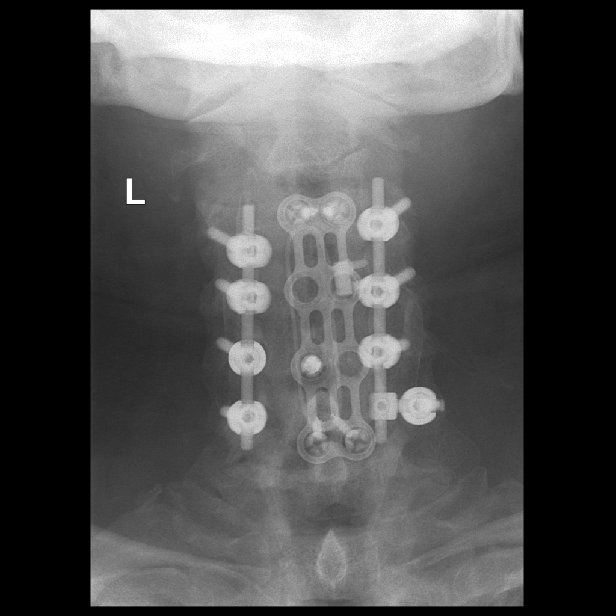

[Series 11: vasc adipose · 1 of 1 slices shown (8 of 11)]
[im 1/1]
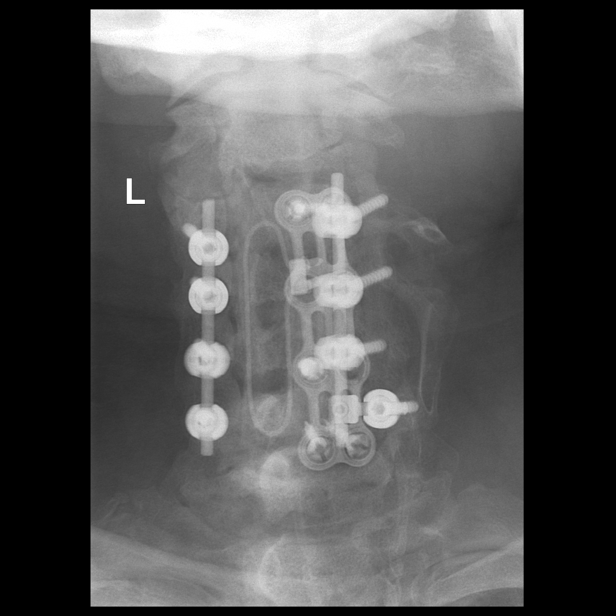

[Series 12: vasc adipose · 1 of 1 slices shown (9 of 11)]
[im 1/1]
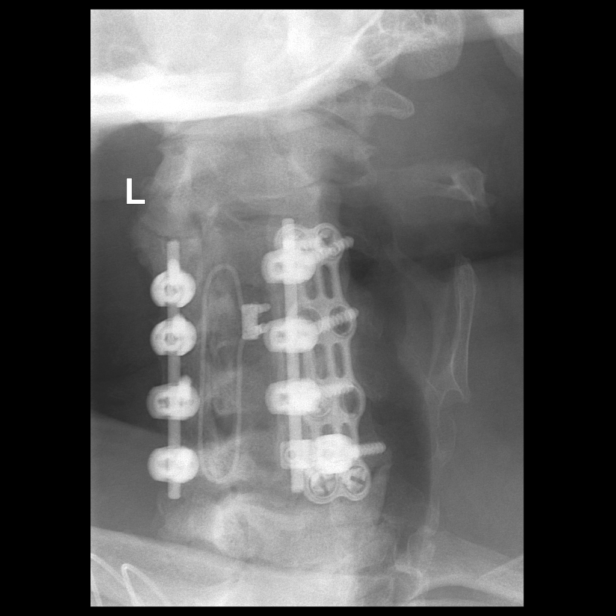

[Series 13: vasc adipose · 1 of 1 slices shown (10 of 11)]
[im 1/1]
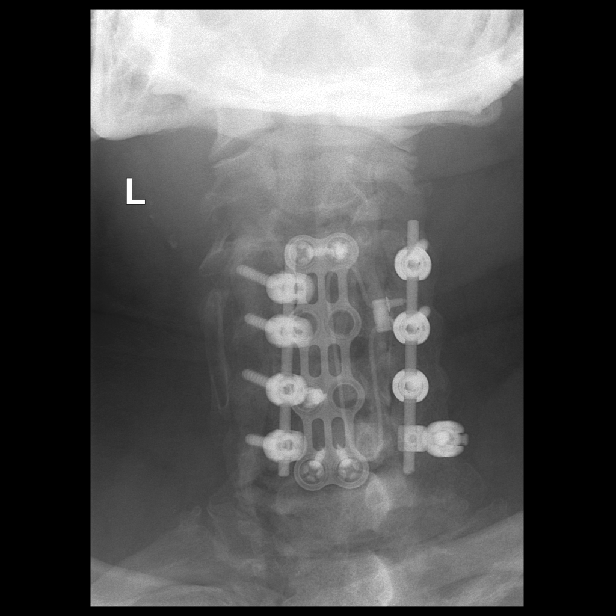

[Series 14: vasc adipose · 1 of 1 slices shown (11 of 11)]
[im 1/1]
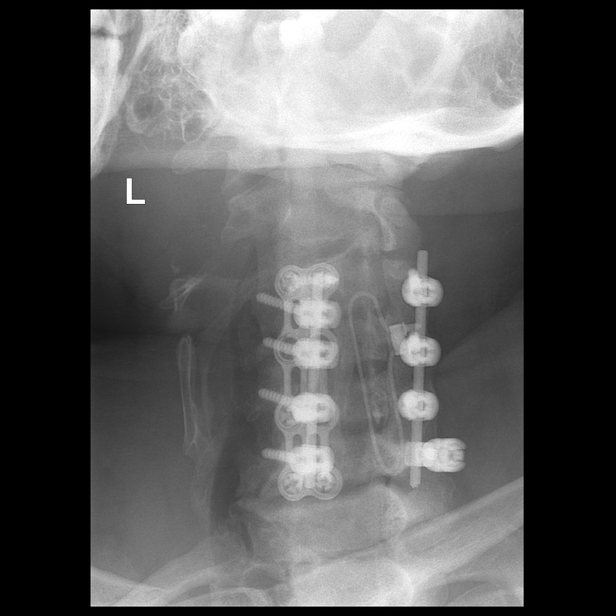

[11 of 11 positions shown; findings below may reference images not displayed]

EXAM:
CERVICAL MYELOGRAM

CT CERVICAL MYELOGRAM

FLUOROSCOPY TIME:  Radiation Exposure Index (as provided by the
fluoroscopic device): 12.4 mGy

Fluoroscopy Time:  29 seconds

Number of Acquired Images:  9

PROCEDURE:
LUMBAR PUNCTURE FOR CERVICAL MYELOGRAM

After thorough discussion of risks and benefits of the procedure
including bleeding, infection, injury to nerves, blood vessels,
adjacent structures as well as headache and CSF leak, written and
oral informed consent was obtained. Consent was obtained by Dr.
Keyli Tiger. We discussed the high likelihood of obtaining a
diagnostic study.

Patient was positioned prone on the fluoroscopy table. Local
anesthesia was provided with 1% lidocaine without epinephrine after
prepped and draped in the usual sterile fashion. Puncture was
performed at L2-L3 using a 5 inch 22-gauge spinal needle via right
interlaminar approach. Using a single pass through the dura, the
needle was placed within the thecal sac, with return of clear CSF.
10 mL of Isovue L-CII was injected into the thecal sac, with normal
opacification of the nerve roots and cauda equina consistent with
free flow within the subarachnoid space. The patient was then moved
to the trendelenburg position and contrast flowed into the cervical
spine region.

I personally performed the lumbar puncture and administered the
intrathecal contrast. I also personally supervised acquisition of
the myelogram images.
FINDINGS: CERVICAL MYELOGRAM FINDINGS:

Prior C3-C6 anterior and posterior fusion. No significant ventral
extradural defect or high-grade spinal canal stenosis. Evaluation of
nerve root sleeve filling is limited due to superimposed spinal
hardware.

CT CERVICAL MYELOGRAM FINDINGS:

Alignment: Unchanged straightening of the normal cervical lordosis
with trace anterolisthesis at C7-T1.

Skull base and vertebrae: Prior C3-C6 anterior and posterior fusion
with partial corpectomies at C4 and C5. Solid fusion. No acute
fracture or other focal pathologic process. Progressive severe
osteoarthritis between the right C1 and C2 lateral masses.

Cord: Unchanged myelomalacia in the left hemicord at C4-C5.

Soft tissues and upper chest: Mild paraseptal emphysema in the lung
apices. Partially visualized pacemaker leads.

Disc levels:

C2-C3: Negative disc. Unchanged right greater than left
uncovertebral hypertrophy. Unchanged moderate right greater than
left neuroforaminal stenosis. No spinal canal stenosis.

C3-C4:  Prior anterior and posterior fusion.  No residual stenosis.

C4-C5: Prior anterior and posterior fusion. Unchanged left-sided
osteophytic ridging contacting the left ventral cord. Unchanged
residual mild left greater than right neuroforaminal stenosis. No
spinal canal stenosis.

C5-C6:  Prior anterior and posterior fusion.  No stenosis.

C6-C7: Unchanged broad-based posterior disc osteophyte complex,
moderate uncovertebral hypertrophy, and mild bilateral facet
arthropathy. Unchanged moderate spinal canal and bilateral
neuroforaminal stenosis.

C7-T1: Unchanged small central disc protrusion, mild bilateral
uncovertebral hypertrophy, moderate to severe bilateral facet
arthropathy. Unchanged mild spinal stenosis. Unchanged severe right
and moderate left neuroforaminal stenosis.

T1-T2: Unchanged circumferential disc osteophyte complex and mild
bilateral facet arthropathy. Unchanged mild spinal canal stenosis.
Unchanged severe bilateral neuroforaminal stenosis.
IMPRESSION: 1. Sequelae of prior C3-C6 anterior and posterior fusion without
significant residual stenosis. Unchanged left hemicord myelomalacia
at C4-C5.
2. Unchanged advanced lower cervical and upper thoracic spondylosis
as described above. Moderate spinal canal stenosis at C6-C7. Severe
neuroforaminal stenosis on the right at C7-T1 and bilaterally at
T1-T2.
3. Progressive severe right lateral osteoarthritis at C1-C2.

## 2021-10-19 IMAGING — CT CT CERVICAL SPINE W/ CM
2 series · 10 of 14 positions shown, 12 images · non-contrast
Comparison: CT cervical myelogram dated March 26, 2019.

CLINICAL DATA: Progressively worsening posterior neck pain
radiating into both shoulders and arms with new bilateral thumb and
second through fourth finger numbness. Prior cervical fusion.
TECHNIQUE: Contiguous axial images were obtained through the cervical spine
after the intrathecal infusion of contrast. Coronal and sagittal
reconstructions were obtained of the axial image sets.

[Series 3: cspine soft · axial · 0.35mm/px · z∈[-229,-113]mm · 5 of 88 slices shown]
[im 15/88  soft-tissue]
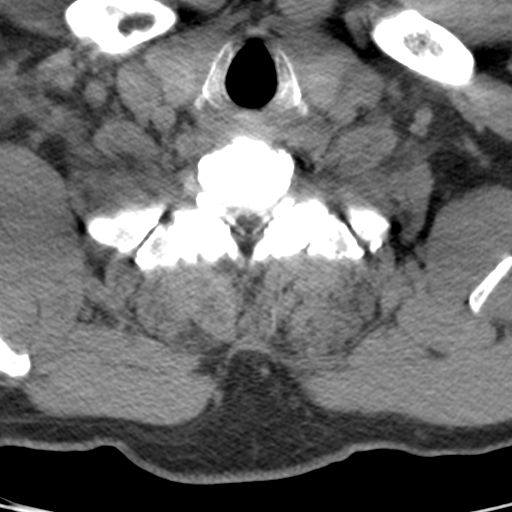
[im 30/88  soft-tissue]
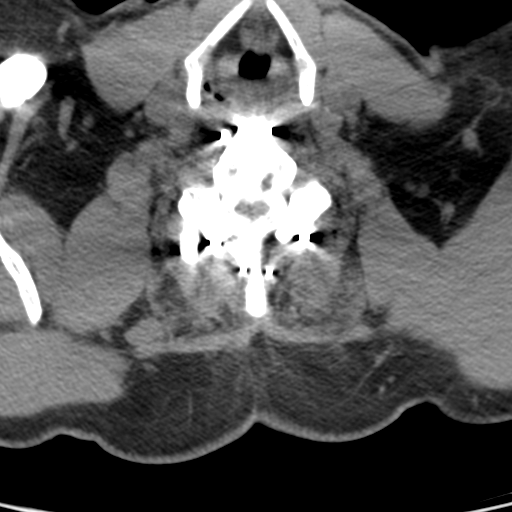
[im 44/88  soft-tissue]
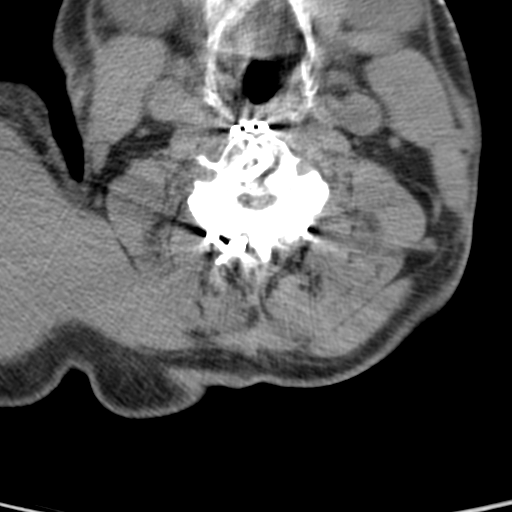
[im 59/88  soft-tissue]
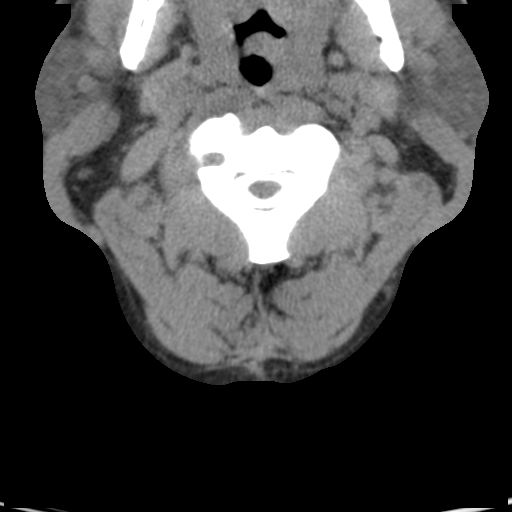
[im 73/88  soft-tissue]
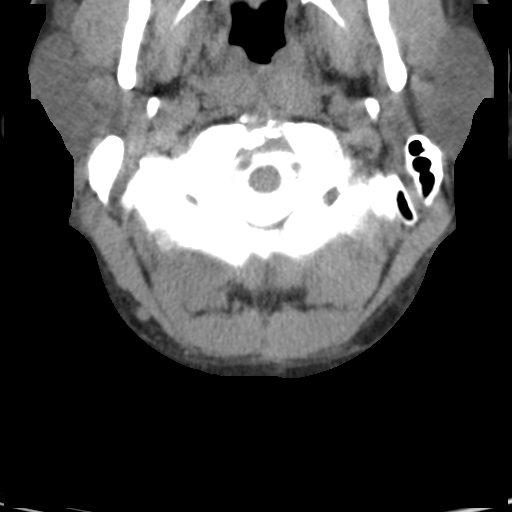

[Series 9: angled axial (person_name) · axial · 0.25mm/px · z∈[-243,-131]mm · 5 of 87 slices shown, 7 images]
[im 15/87  soft-tissue]
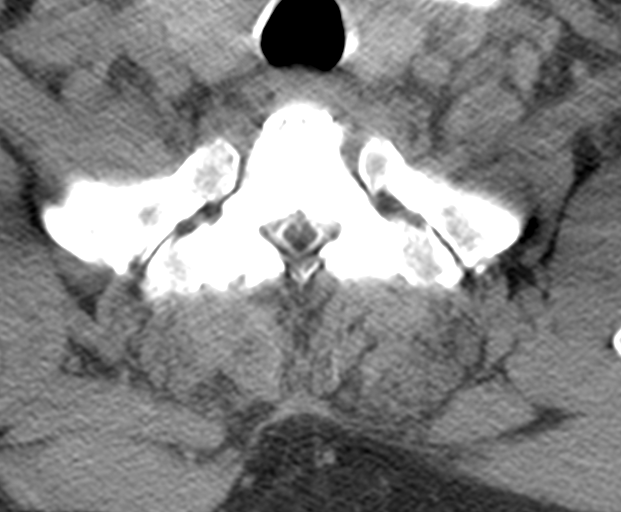
[im 15/87  bone]
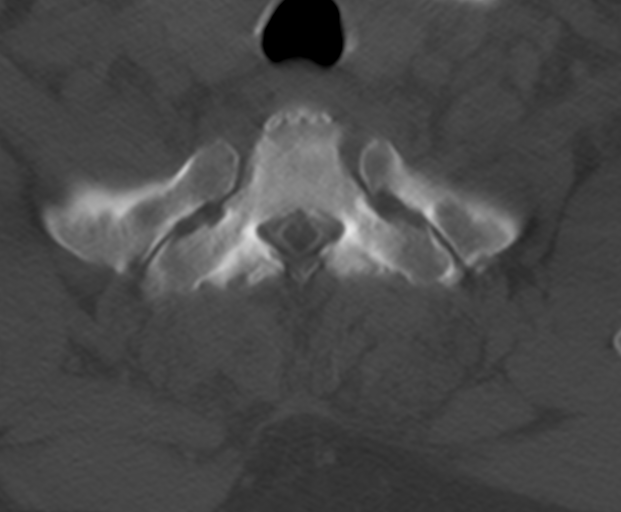
[im 29/87  bone]
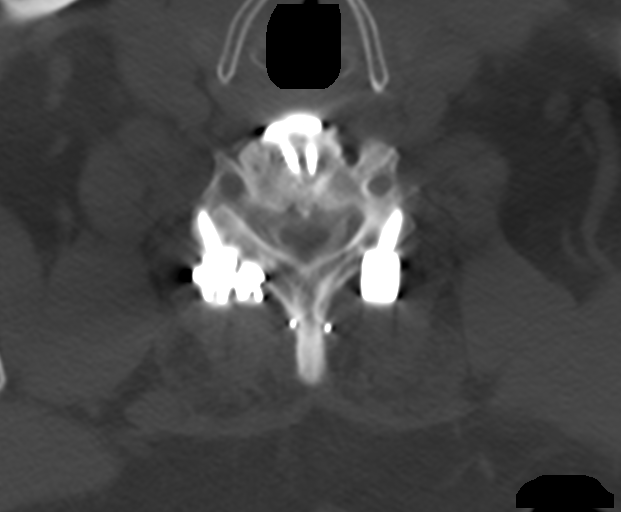
[im 44/87  bone]
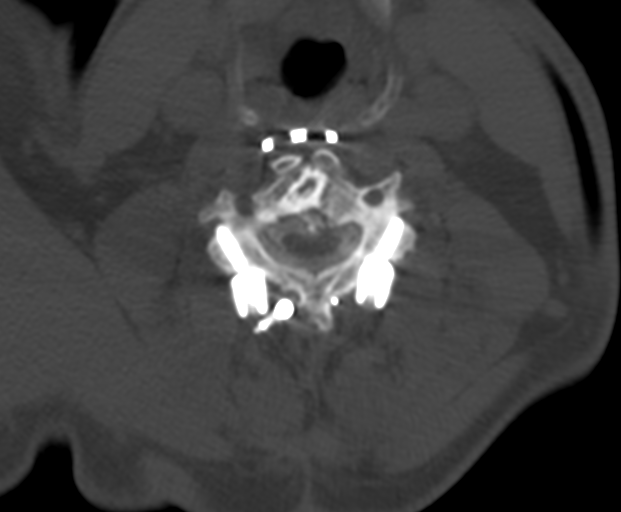
[im 58/87  bone]
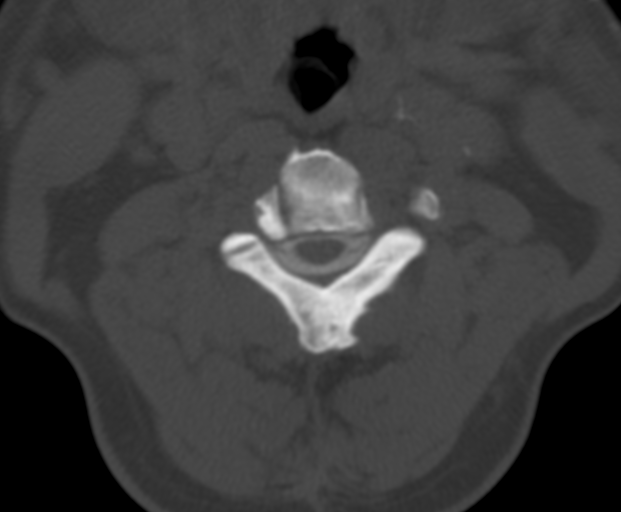
[im 72/87  soft-tissue]
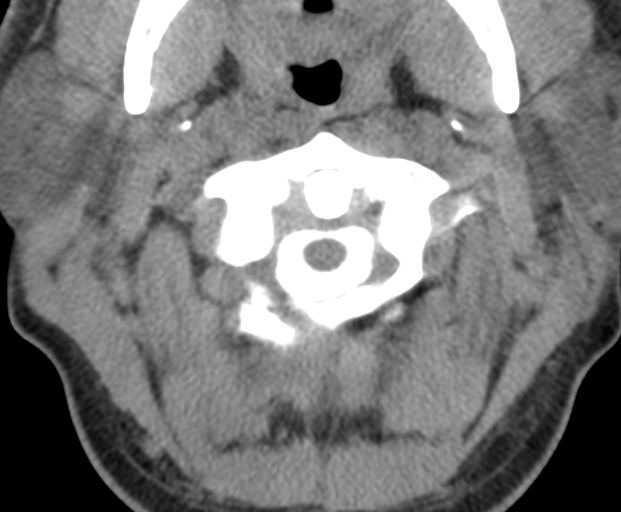
[im 72/87  bone]
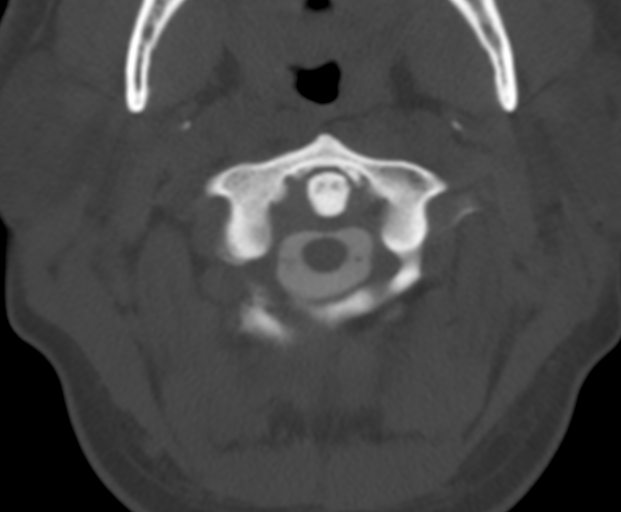

[10 of 14 positions shown; findings below may reference images not displayed]

EXAM:
CERVICAL MYELOGRAM

CT CERVICAL MYELOGRAM

FLUOROSCOPY TIME:  Radiation Exposure Index (as provided by the
fluoroscopic device): 12.4 mGy

Fluoroscopy Time:  29 seconds

Number of Acquired Images:  9

PROCEDURE:
LUMBAR PUNCTURE FOR CERVICAL MYELOGRAM

After thorough discussion of risks and benefits of the procedure
including bleeding, infection, injury to nerves, blood vessels,
adjacent structures as well as headache and CSF leak, written and
oral informed consent was obtained. Consent was obtained by Dr.
Keyli Tiger. We discussed the high likelihood of obtaining a
diagnostic study.

Patient was positioned prone on the fluoroscopy table. Local
anesthesia was provided with 1% lidocaine without epinephrine after
prepped and draped in the usual sterile fashion. Puncture was
performed at L2-L3 using a 5 inch 22-gauge spinal needle via right
interlaminar approach. Using a single pass through the dura, the
needle was placed within the thecal sac, with return of clear CSF.
10 mL of Isovue L-CII was injected into the thecal sac, with normal
opacification of the nerve roots and cauda equina consistent with
free flow within the subarachnoid space. The patient was then moved
to the trendelenburg position and contrast flowed into the cervical
spine region.

I personally performed the lumbar puncture and administered the
intrathecal contrast. I also personally supervised acquisition of
the myelogram images.
FINDINGS: CERVICAL MYELOGRAM FINDINGS:

Prior C3-C6 anterior and posterior fusion. No significant ventral
extradural defect or high-grade spinal canal stenosis. Evaluation of
nerve root sleeve filling is limited due to superimposed spinal
hardware.

CT CERVICAL MYELOGRAM FINDINGS:

Alignment: Unchanged straightening of the normal cervical lordosis
with trace anterolisthesis at C7-T1.

Skull base and vertebrae: Prior C3-C6 anterior and posterior fusion
with partial corpectomies at C4 and C5. Solid fusion. No acute
fracture or other focal pathologic process. Progressive severe
osteoarthritis between the right C1 and C2 lateral masses.

Cord: Unchanged myelomalacia in the left hemicord at C4-C5.

Soft tissues and upper chest: Mild paraseptal emphysema in the lung
apices. Partially visualized pacemaker leads.

Disc levels:

C2-C3: Negative disc. Unchanged right greater than left
uncovertebral hypertrophy. Unchanged moderate right greater than
left neuroforaminal stenosis. No spinal canal stenosis.

C3-C4:  Prior anterior and posterior fusion.  No residual stenosis.

C4-C5: Prior anterior and posterior fusion. Unchanged left-sided
osteophytic ridging contacting the left ventral cord. Unchanged
residual mild left greater than right neuroforaminal stenosis. No
spinal canal stenosis.

C5-C6:  Prior anterior and posterior fusion.  No stenosis.

C6-C7: Unchanged broad-based posterior disc osteophyte complex,
moderate uncovertebral hypertrophy, and mild bilateral facet
arthropathy. Unchanged moderate spinal canal and bilateral
neuroforaminal stenosis.

C7-T1: Unchanged small central disc protrusion, mild bilateral
uncovertebral hypertrophy, moderate to severe bilateral facet
arthropathy. Unchanged mild spinal stenosis. Unchanged severe right
and moderate left neuroforaminal stenosis.

T1-T2: Unchanged circumferential disc osteophyte complex and mild
bilateral facet arthropathy. Unchanged mild spinal canal stenosis.
Unchanged severe bilateral neuroforaminal stenosis.
IMPRESSION: 1. Sequelae of prior C3-C6 anterior and posterior fusion without
significant residual stenosis. Unchanged left hemicord myelomalacia
at C4-C5.
2. Unchanged advanced lower cervical and upper thoracic spondylosis
as described above. Moderate spinal canal stenosis at C6-C7. Severe
neuroforaminal stenosis on the right at C7-T1 and bilaterally at
T1-T2.
3. Progressive severe right lateral osteoarthritis at C1-C2.

## 2021-10-29 ENCOUNTER — Encounter (HOSPITAL_BASED_OUTPATIENT_CLINIC_OR_DEPARTMENT_OTHER): Payer: Self-pay | Admitting: Nurse Practitioner

## 2022-01-07 ENCOUNTER — Ambulatory Visit (INDEPENDENT_AMBULATORY_CARE_PROVIDER_SITE_OTHER): Payer: BC Managed Care – PPO

## 2022-01-07 DIAGNOSIS — I440 Atrioventricular block, first degree: Secondary | ICD-10-CM | POA: Diagnosis not present

## 2022-01-10 LAB — CUP PACEART REMOTE DEVICE CHECK
Battery Remaining Longevity: 112 mo
Battery Remaining Percentage: 95.5 %
Battery Voltage: 3.02 V
Brady Statistic AP VP Percent: 1 %
Brady Statistic AP VS Percent: 1 %
Brady Statistic AS VP Percent: 8.9 %
Brady Statistic AS VS Percent: 90 %
Brady Statistic RA Percent Paced: 1 %
Brady Statistic RV Percent Paced: 9.5 %
Date Time Interrogation Session: 20230312172531
Implantable Lead Implant Date: 19990719
Implantable Lead Implant Date: 19990719
Implantable Lead Location: 753859
Implantable Lead Location: 753860
Implantable Pulse Generator Implant Date: 20220907
Lead Channel Impedance Value: 360 Ohm
Lead Channel Impedance Value: 380 Ohm
Lead Channel Pacing Threshold Amplitude: 0.5 V
Lead Channel Pacing Threshold Amplitude: 1 V
Lead Channel Pacing Threshold Pulse Width: 0.5 ms
Lead Channel Pacing Threshold Pulse Width: 0.5 ms
Lead Channel Sensing Intrinsic Amplitude: 3.6 mV
Lead Channel Sensing Intrinsic Amplitude: 8.2 mV
Lead Channel Setting Pacing Amplitude: 2 V
Lead Channel Setting Pacing Amplitude: 2.5 V
Lead Channel Setting Pacing Pulse Width: 0.5 ms
Lead Channel Setting Sensing Sensitivity: 2 mV
Pulse Gen Model: 2272
Pulse Gen Serial Number: 6519619

## 2022-01-11 NOTE — Progress Notes (Signed)
Remote pacemaker transmission.   

## 2022-03-08 ENCOUNTER — Other Ambulatory Visit: Payer: Self-pay | Admitting: Internal Medicine

## 2022-03-10 ENCOUNTER — Ambulatory Visit (HOSPITAL_BASED_OUTPATIENT_CLINIC_OR_DEPARTMENT_OTHER): Payer: BC Managed Care – PPO | Admitting: Nurse Practitioner

## 2022-03-10 ENCOUNTER — Encounter (HOSPITAL_BASED_OUTPATIENT_CLINIC_OR_DEPARTMENT_OTHER): Payer: Self-pay | Admitting: Nurse Practitioner

## 2022-03-10 VITALS — HR 73 | Ht 73.0 in | Wt 249.0 lb

## 2022-03-10 DIAGNOSIS — D239 Other benign neoplasm of skin, unspecified: Secondary | ICD-10-CM

## 2022-03-10 DIAGNOSIS — L723 Sebaceous cyst: Secondary | ICD-10-CM

## 2022-03-11 ENCOUNTER — Encounter (HOSPITAL_BASED_OUTPATIENT_CLINIC_OR_DEPARTMENT_OTHER): Payer: Self-pay | Admitting: Nurse Practitioner

## 2022-03-14 ENCOUNTER — Other Ambulatory Visit (HOSPITAL_BASED_OUTPATIENT_CLINIC_OR_DEPARTMENT_OTHER): Payer: Self-pay

## 2022-03-14 ENCOUNTER — Other Ambulatory Visit (HOSPITAL_BASED_OUTPATIENT_CLINIC_OR_DEPARTMENT_OTHER): Payer: Self-pay | Admitting: Nurse Practitioner

## 2022-03-14 ENCOUNTER — Encounter (HOSPITAL_BASED_OUTPATIENT_CLINIC_OR_DEPARTMENT_OTHER): Payer: Self-pay | Admitting: Nurse Practitioner

## 2022-03-14 ENCOUNTER — Ambulatory Visit (INDEPENDENT_AMBULATORY_CARE_PROVIDER_SITE_OTHER): Payer: BC Managed Care – PPO | Admitting: Nurse Practitioner

## 2022-03-14 VITALS — BP 128/88 | HR 98 | Ht 73.0 in | Wt 253.6 lb

## 2022-03-14 DIAGNOSIS — G4733 Obstructive sleep apnea (adult) (pediatric): Secondary | ICD-10-CM

## 2022-03-14 DIAGNOSIS — E669 Obesity, unspecified: Secondary | ICD-10-CM

## 2022-03-14 DIAGNOSIS — Z0000 Encounter for general adult medical examination without abnormal findings: Secondary | ICD-10-CM | POA: Diagnosis not present

## 2022-03-14 DIAGNOSIS — L723 Sebaceous cyst: Secondary | ICD-10-CM

## 2022-03-14 DIAGNOSIS — I429 Cardiomyopathy, unspecified: Secondary | ICD-10-CM

## 2022-03-14 DIAGNOSIS — E559 Vitamin D deficiency, unspecified: Secondary | ICD-10-CM

## 2022-03-14 DIAGNOSIS — Z113 Encounter for screening for infections with a predominantly sexual mode of transmission: Secondary | ICD-10-CM

## 2022-03-14 NOTE — Progress Notes (Signed)
BP 128/88   Pulse 98   Ht 6' 1" (1.854 m)   Wt 253 lb 9.6 oz (115 kg)   SpO2 98%   BMI 33.46 kg/m    Subjective:    Patient ID: [name] Fields, male    DOB: Mar 24, 1961, 61 y.o.   MRN: [id number]  HPI: [name] Fields is a 61 y.o. male presenting on 03/14/2022 for comprehensive medical examination.   Current medical concerns include: Re-evaluation of I&D areas from last week. Drainage present to most superior incision site. No erythema, purulence, fluctuation, fevers, chills, pain, or odor present.   He reports regular vision exams q1-5y: yes He reports regular dental exams q 77m yes His diet consists of:  Working on healthy dietary habits and has recently changed diet. He endorses exercise and/or activity of:  30-45 minutes daily at the gym- started about 1 month ago. Has [employer] membership that will allow him to go to the gym when he is working out of town.  He works as:  TNeurosurgeon He endorses ETOH use ( about 6 drinks a week ) He endorses nictoine use ( 1/2 PPD cigarettes ) He denies illegal substance use  He is sexually active and would like routine STI screening today  He denies concerns about skin changes today He denies concerns about bowel changes today  He endorses concerns about bladder changes today ( odor )  Most Recent Depression Screen:     03/14/2022    3:03 PM 03/10/2022    8:39 AM 08/24/2021    7:52 PM  Depression screen PHQ 2/9  Decreased Interest 0 0 2  Down, Depressed, Hopeless 0 0 0  PHQ - 2 Score 0 0 2  Altered sleeping   2  Tired, decreased energy   2  Change in appetite   3  Feeling bad or failure about yourself    0  Trouble concentrating   0  Moving slowly or fidgety/restless   1  Suicidal thoughts   0  PHQ-9 Score   10   Most Recent Anxiety Screen:     View : No data to display.         Most Recent Falls Screen:    03/14/2022    3:03 PM 03/10/2022    8:38 AM 08/24/2021    7:52 PM  Fall Risk   Falls in the  past year? 0 0 0  Number falls in past yr: 0 0 0  Injury with Fall? 0 0 0  Risk for fall due to : Other (Comment) No Fall Risks No Fall Risks  Follow up Falls evaluation completed;Education provided Falls evaluation completed;Education provided Falls evaluation completed;Education provided    Past medical history, surgical history, medications, allergies, family history and social history reviewed with patient today and changes made to appropriate areas of the chart.  Past Medical History:  Past Medical History:  Diagnosis Date   Allergy to ACE inhibitors    Atrial tachycardia (HCC)    Cardiomyopathy, nonischemic (HCC)    GERD (gastroesophageal reflux disease)    Hiatal hernia    High-grade second-degree heart block     Qualifier: Diagnosis of  By: KCaryl Comes MD, FRemus Blake   Hypertension    Permanent pacemaker-[employer] [place] [facility] re in the summer he has o pain syndrome BP 135% at time long so is his we have been short of his device is in a in  a in a Foley disease since the stents removed about 3.0-4 seconds on the episode the patient is very 16 and a [place] when his pacemaker but when he and his placement 2011   Presence of permanent cardiac pacemaker    Sleep apnea    uses CPAP sometimes   WPW (Wolff-Parkinson-White syndrome)    s/pRFCA   Medications:  Current Outpatient Medications on File Prior to Visit  Medication Sig   Ascorbic Acid (VITAMIN C) 1000 MG tablet Take 1,000 mg by mouth daily.   fluticasone (FLONASE) 50 MCG/ACT nasal spray Place into both nostrils as needed for allergies or rhinitis.   hydrochlorothiazide (HYDRODIURIL) 25 MG tablet TAKE ONE TABLET BY MOUTH ONE TIME DAILY   traMADol (ULTRAM) 50 MG tablet Take by mouth every 6 (six) hours as needed.   No current facility-administered medications on file prior to visit.   Surgical History:  Past Surgical History:  Procedure Laterality Date   CARDIAC DEFIBRILLATOR PLACEMENT  2011   [employer] --  [employer]   CARPAL TUNNEL RELEASE Left 08/18/2020   Procedure: CARPAL TUNNEL RELEASE;  Surgeon: Daryll Brod, MD;  Location: [facility];  Service: Orthopedics;  Laterality: Left;  IV REGIONAL FOREARM BLOCK   CARPAL TUNNEL RELEASE Right 12/01/2020   Procedure: CARPAL TUNNEL RELEASE;  Surgeon: Daryll Brod, MD;  Location: [facility];  Service: Orthopedics;  Laterality: Right;  IV REGIONAL FOREARM BLOCK   PACEMAKER INSERTION  1999   explanted 2011   PPM GENERATOR CHANGEOUT N/A 07/07/2021   Procedure: PPM GENERATOR CHANGEOUT;  Surgeon: [name], MD;  Location: [facility];  Service: Cardiovascular;  Laterality: N/A;   Allergies:  Allergies  Allergen Reactions   Lisinopril Swelling    Lip swelling   Social History:  Social History   Socioeconomic History   Marital status: Married    Spouse name: Not on file   Number of children: Not on file   Years of education: Not on file   Highest education level: Not on file  Occupational History   Occupation: truck driver    Employer: [employer]  Tobacco Use   Smoking status: Every Day    Packs/day: 0.50    Years: 43.00    Pack years: 21.50    Types: Cigarettes    Start date: 1978   Smokeless tobacco: Never   Tobacco comments:    Pt has stopped and satated back  Vaping Use   Vaping Use: Never used  Substance and Sexual Activity   Alcohol use: Yes    Alcohol/week: 7.0 standard drinks    Types: 7 Standard drinks or equivalent per week    Comment: social   Drug use: No   Sexual activity: Yes  Other Topics Concern   Not on file  Social History Narrative   Not on file   Social Determinants of Health   Financial Resource Strain: Not on file  Food Insecurity: Not on file  Transportation Needs: Not on file  Physical Activity: Not on file  Stress: Not on file  Social Connections: Not on file  Intimate Partner Violence: Not on file   Social History   Tobacco Use  Smoking Status Every Day    Packs/day: 0.50   Years: 43.00   Pack years: 21.50   Types: Cigarettes   Start date: 1978  Smokeless Tobacco Never  Tobacco Comments   Pt has stopped and satated back   Social History  Substance and Sexual Activity  Alcohol Use Yes   Alcohol/week: 7.0 standard drinks   Types: 7 Standard drinks or equivalent per week   Comment: social   Family History:  Family History  Problem Relation Age of Onset   Breast cancer Mother    Heart disease Mother    Heart Problems Mother        pacemaker   Prostate cancer Father    Hypertension Father      All ROS negative except what is listed above and in the HPI.      Objective:    BP 128/88   Pulse 98   Ht 6' 1" (1.854 m)   Wt 253 lb 9.6 oz (115 kg)   SpO2 98%   BMI 33.46 kg/m   Wt Readings from Last 3 Encounters:  03/14/22 253 lb 9.6 oz (115 kg)  03/10/22 249 lb (112.9 kg)  10/14/21 257 lb (116.6 kg)    Physical Exam Vitals and nursing note reviewed.  Constitutional:      General: He is not in acute distress.    Appearance: Normal appearance.  HENT:     Head: Normocephalic and atraumatic.     Right Ear: Hearing, tympanic membrane, ear canal and external ear normal.     Left Ear: Hearing, tympanic membrane, ear canal and external ear normal.     Nose: Nose normal.     Right Sinus: No maxillary sinus tenderness or frontal sinus tenderness.     Left Sinus: No maxillary sinus tenderness or frontal sinus tenderness.     Mouth/Throat:     Lips: Pink.     Mouth: Mucous membranes are moist.     Pharynx: Oropharynx is clear.  Eyes:     General: Lids are normal. Vision grossly intact.     Extraocular Movements: Extraocular movements intact.     Conjunctiva/sclera: Conjunctivae normal.     Pupils: Pupils are equal, round, and reactive to light.     Funduscopic exam:    Right eye: No hemorrhage. Red reflex present.        Left eye: No hemorrhage. Red reflex present.    Visual Fields: Right eye visual fields normal and  left eye visual fields normal.  Neck:     Thyroid: No thyromegaly.     Vascular: No carotid bruit or JVD.  Cardiovascular:     Rate and Rhythm: Normal rate and regular rhythm.     Chest Wall: PMI is not displaced.     Pulses: Normal pulses.          Dorsalis pedis pulses are 2+ on the right side and 2+ on the left side.       Posterior tibial pulses are 2+ on the right side and 2+ on the left side.     Heart sounds: Normal heart sounds. No murmur heard. Pulmonary:     Effort: Pulmonary effort is normal. No respiratory distress.     Breath sounds: Normal breath sounds.  Chest:  Breasts:    Breasts are symmetrical.  Abdominal:     General: Bowel sounds are normal. There is no distension or abdominal bruit.     Palpations: Abdomen is soft. There is no hepatomegaly, splenomegaly or mass.     Tenderness: There is no abdominal tenderness. There is no right CVA tenderness, left CVA tenderness, guarding or rebound.  Musculoskeletal:        General: Normal range of motion.     Cervical back: Full passive range  of motion without pain and neck supple. No tenderness. No spinous process tenderness or muscular tenderness.     Right lower leg: No edema.     Left lower leg: No edema.  Feet:     Right foot:     Toenail Condition: Right toenails are normal.     Left foot:     Toenail Condition: Left toenails are normal.  Lymphadenopathy:     Cervical: No cervical adenopathy.     Upper Body:     Right upper body: No supraclavicular adenopathy.     Left upper body: No supraclavicular adenopathy.  Skin:    General: Skin is warm and dry.     Capillary Refill: Capillary refill takes less than 2 seconds.     Nails: There is no clubbing.     Comments: Well healing incisions to the upper back from recent I&D.  Superior incision is open and draining small amount of serosanguinous fluid with no odor. Area healing nicely with no signs of infection.  Inferior incision dry and covered in skin glue with no  signs of infection.   Neurological:     General: No focal deficit present.     Mental Status: He is alert and oriented to person, place, and time.     Cranial Nerves: No cranial nerve deficit.     Sensory: Sensation is intact. No sensory deficit.     Motor: Motor function is intact. No weakness.     Coordination: Coordination is intact. Coordination normal.     Gait: Gait is intact. Gait normal.  Psychiatric:        Attention and Perception: Attention normal.        Mood and Affect: Mood normal.        Speech: Speech normal.        Behavior: Behavior normal. Behavior is cooperative.        Thought Content: Thought content normal.        Cognition and Memory: Cognition and memory normal.        Judgment: Judgment normal.    Results for orders placed or performed in visit on 03/14/22  CBC with Differential/Platelet  Result Value Ref Range   WBC 8.1 3.4 - 10.8 x10E3/uL   RBC 5.26 4.14 - 5.80 x10E6/uL   Hemoglobin 15.4 13.0 - 17.7 g/dL   Hematocrit 44.9 37.5 - 51.0 %   MCV 85 79 - 97 fL   MCH 29.3 26.6 - 33.0 pg   MCHC 34.3 31.5 - 35.7 g/dL   RDW 13.1 11.6 - 15.4 %   Platelets 345 150 - 450 x10E3/uL   Neutrophils 40 Not Estab. %   Lymphs 51 Not Estab. %   Monocytes 7 Not Estab. %   Eos 1 Not Estab. %   Basos 1 Not Estab. %   Neutrophils Absolute 3.2 1.4 - 7.0 x10E3/uL   Lymphocytes Absolute 4.2 (H) 0.7 - 3.1 x10E3/uL   Monocytes Absolute 0.6 0.1 - 0.9 x10E3/uL   EOS (ABSOLUTE) 0.1 0.0 - 0.4 x10E3/uL   Basophils Absolute 0.0 0.0 - 0.2 x10E3/uL   Immature Granulocytes 0 Not Estab. %   Immature Grans (Abs) 0.0 0.0 - 0.1 x10E3/uL  Comprehensive metabolic panel  Result Value Ref Range   Glucose 82 70 - 99 mg/dL   BUN 15 8 - 27 mg/dL   Creatinine, Ser 1.24 0.76 - 1.27 mg/dL   eGFR 67 >59 mL/min/1.73   BUN/Creatinine Ratio 12 10 - 24   Sodium 139  134 - 144 mmol/L   Potassium 4.2 3.5 - 5.2 mmol/L   Chloride 102 96 - 106 mmol/L   CO2 23 20 - 29 mmol/L   Calcium 9.8 8.6 - 10.2  mg/dL   Total Protein 7.5 6.0 - 8.5 g/dL   Albumin 4.6 3.8 - 4.9 g/dL   Globulin, Total 2.9 1.5 - 4.5 g/dL   Albumin/Globulin Ratio 1.6 1.2 - 2.2   Bilirubin Total 0.3 0.0 - 1.2 mg/dL   Alkaline Phosphatase 149 (H) 44 - 121 IU/L   AST 26 0 - 40 IU/L   ALT 31 0 - 44 IU/L  Hemoglobin A1c  Result Value Ref Range   Hgb A1c MFr Bld 6.2 (H) 4.8 - 5.6 %   Est. average glucose Bld gHb Est-mCnc 131 mg/dL  VITAMIN D 25 Hydroxy (Vit-D Deficiency, Fractures)  Result Value Ref Range   Vit D, 25-Hydroxy 23.3 (L) 30.0 - 100.0 ng/mL  HIV Antibody (routine testing w rflx)  Result Value Ref Range   HIV Screen 4th Generation wRfx Non Reactive Non Reactive      Assessment & Plan:   Problem List Items Addressed This Visit     Inflamed epidermoid cyst of skin    Healing well. Drainage present consistent with normal healing from the inside out. No signs of infection. Areas cleansed and covered in bandage today. Recommend keeping bandage over area until drainage stops. Monitor for signs of infection or recurrence of cystic lesion and report immediately.        Relevant Orders   CBC with Differential/Platelet (Completed)   Health maintenance examination - Primary   Relevant Orders   CBC with Differential/Platelet (Completed)   Comprehensive metabolic panel (Completed)   Hemoglobin A1c (Completed)   VITAMIN D 25 Hydroxy (Vit-D Deficiency, Fractures) (Completed)   HIV Antibody (routine testing w rflx) (Completed)   Urine Microalbumin w/creat. ratio   Ct, Ng, Mycoplasmas NAA, Urine   UA/M w/rflx Culture, Comp   Vitamin D deficiency   Relevant Medications   Vitamin D, Ergocalciferol, (DRISDOL) 1.25 MG (50000 UNIT) CAPS capsule   Other Relevant Orders   VITAMIN D 25 Hydroxy (Vit-D Deficiency, Fractures) (Completed)   Routine screening for STI (sexually transmitted infection)   Relevant Orders   HIV Antibody (routine testing w rflx) (Completed)   Urine Microalbumin w/creat. ratio   Ct, Ng,  Mycoplasmas NAA, Urine   UA/M w/rflx Culture, Comp   OSA on CPAP   Relevant Orders   CBC with Differential/Platelet (Completed)   Comprehensive metabolic panel (Completed)   Hemoglobin A1c (Completed)   Cardiomyopathy ([place])   Relevant Orders   CBC with Differential/Platelet (Completed)   Comprehensive metabolic panel (Completed)   Hemoglobin A1c (Completed)   Obesity (BMI 35.0-39.9 without comorbidity)   Relevant Orders   CBC with Differential/Platelet (Completed)   Comprehensive metabolic panel (Completed)   Hemoglobin A1c (Completed)     Follow up plan: NEXT PREVENTATIVE PHYSICAL DUE IN 1 YEAR. Return in about 6 months (around 09/14/2022) for HTN, Vitamin D.  LABORATORY TESTING:  Health maintenance labs ordered today, if applicable.  - STI testing: done today  IMMUNIZATIONS:   - Tdap: Tetanus vaccination status reviewed: tetanus status unknown to the patient, declined. - Influenza: Postponed to flu season - Pneumovax: Not applicable - Prevnar: Not applicable - HPV: Not applicable - Zostavax vaccine:  script provided today  SCREENING: - Colonoscopy: Up to date  Discussed with patient purpose of the colonoscopy is to detect colon cancer at curable precancerous  or Jamilya Sarrazin stages  - AAA Screening: Not applicable  - Hearing Test: Not applicable  - Spirometry: Not applicable  - PSA: Not applicable   PATIENT COUNSELING:   For all adult patients, I recommend A well balanced diet low in saturated fats, cholesterol, and moderation in carbohydrates.   This can be as simple as monitoring portion sizes and cutting back on sugary beverages such as soda and juice to start with.    Daily water consumption of at least 64 ounces.  Physical activity at least 180 minutes per week, if just starting out.   This can be as simple as taking the stairs instead of the elevator and walking 2-3 laps around the office  purposefully every day.   STD protection, partner selection, and regular  testing if high risk.  Limited consumption of alcoholic beverages if alcohol is consumed.  For women, I recommend no more than 7 alcoholic beverages per week, spread out throughout the week.  Avoid "binge" drinking or consuming large quantities of alcohol in one setting.   Please let me know if you feel you may need help with reduction or quitting alcohol consumption.   Avoidance of nicotine, if used.  Please let me know if you feel you may need help with reduction or quitting nicotine use.   Daily mental health attention.  This can be in the form of 5 minute daily meditation, prayer, journaling, yoga, reflection, etc.   Purposeful attention to your emotions and mental state can significantly improve your overall wellbeing  and  Health.  Please know that I am here to help you with all of your health care goals and am happy to work with you to find a solution that works best for you.  The greatest advice I have received with any changes in life are to take it one step at a time, that even means if all you can focus on is the next 60 seconds, then do that and celebrate your victories.  With any changes in life, you will have set backs, and that is OK. The important thing to remember is, if you have a set back, it is not a failure, it is an opportunity to try again!  Health Maintenance Recommendations Screening Testing Mammogram Every 1 -2 years based on history and risk factors Starting at age 57 Pap Smear Ages 21-39 every 3 years Ages 100-65 every 5 years with HPV testing More frequent testing may be required based on results and history Colon Cancer Screening Every 1-10 years based on test performed, risk factors, and history Starting at age 69 Bone Density Screening Every 2-10 years based on history Starting at age 74 for women Recommendations for men differ based on medication usage, history, and risk factors AAA Screening One time ultrasound Men 43-50 years old who have every  smoked Lung Cancer Screening Low Dose Lung CT every 12 months Age 34-80 years with a 30 pack-year smoking history who still smoke or who have quit within the last 15 years  Screening Labs Routine  Labs: Complete Blood Count (CBC), Complete Metabolic Panel (CMP), Cholesterol (Lipid Panel) Every 6-12 months based on history and medications May be recommended more frequently based on current conditions or previous results Hemoglobin A1c Lab Every 3-12 months based on history and previous results Starting at age 16 or earlier with diagnosis of diabetes, high cholesterol, BMI >26, and/or risk factors Frequent monitoring for patients with diabetes to ensure blood sugar control Thyroid Panel (TSH w/ T3 &  T4) Every 6 months based on history, symptoms, and risk factors May be repeated more often if on medication HIV One time testing for all patients 54 and older May be repeated more frequently for patients with increased risk factors or exposure Hepatitis C One time testing for all patients 40 and older May be repeated more frequently for patients with increased risk factors or exposure Gonorrhea, Chlamydia Every 12 months for all sexually active persons 13-24 years Additional monitoring may be recommended for those who are considered high risk or who have symptoms PSA Men 63-56 years old with risk factors Additional screening may be recommended from age 20-69 based on risk factors, symptoms, and history  Vaccine Recommendations Tetanus Booster All adults every 10 years Flu Vaccine All patients 6 months and older every year COVID Vaccine All patients 12 years and older Initial dosing with booster May recommend additional booster based on age and health history HPV Vaccine 2 doses all patients age 68-26 Dosing may be considered for patients over 26 Shingles Vaccine (Shingrix) 2 doses all adults 8 years and older Pneumonia (Pneumovax 23) All adults 73 years and older May recommend  earlier dosing based on health history Pneumonia (Prevnar 29) All adults 24 years and older Dosed 1 year after Pneumovax 23  Additional Screening, Testing, and Vaccinations may be recommended on an individualized basis based on family history, health history, risk factors, and/or exposure.

## 2022-03-14 NOTE — Patient Instructions (Signed)
It was a pleasure seeing you today. I hope your time spent with [place] was pleasant and helpful. Please let us know if there is anything we can do to improve the service you receive.  ? ?You are doing awesome! Keep up the great work! ? ? ?Important Office Information ?Lab Results ?If labs were ordered, please note that you will see results through [facility] as soon as they come available from [facility].  ?It takes up to 5 business days for the results to be routed to me and for me to review them once all of the lab results have come through from [facility]. I will make recommendations based on your results and send these through [facility] or someone from the office will call you to discuss. If your labs are abnormal, we may contact you to schedule a visit to discuss the results and make recommendations.  ?If you have not heard from [place] within 5 business days or you have waited longer than a week and your lab results have not come through on [place], please feel free to call the office or send a message through [facility] to follow-up on these labs.  ? ?Referrals ?If referrals were placed today, the office where the referral was sent will contact you either by phone or through [facility] to set up scheduling. Please note that it can take up to a week for the referral office to contact you. If you do not hear from them in a week, please contact the referral office directly to inquire about scheduling.  ? ?Condition Treated ?If your condition worsens or you begin to have new symptoms, please schedule a follow-up appointment for further evaluation. If you are not sure if an appointment is needed, you may call the office to leave a message for the nurse and someone will contact you with recommendations.  ?If you have an urgent or life threatening emergency, please do not call the office, but seek emergency evaluation by calling 911 or going to the nearest emergency room for evaluation.  ? ?MyChart and Phone Calls ?Please do not use MyChart  for urgent messages. It may take up to 3 business days for MyChart messages to be read by staff and if they are unable to handle the request, an additional 3 business days for them to be routed to me and for my response.  ?Messages sent to the provider through [place] do not come directly to the provider, please allow time for these messages to be routed and for me to respond.  ?We get a large volume of MyChart messages daily and these are responded to in the order received.  ? ?For urgent messages, please call the office at [phone] and speak with the front office staff or leave a message on the line of my assistant for guidance.  ?We are seeing patients from the hours of 8:00 am through 5:00 pm and calls directly to the nurse may not be answered immediately due to seeing patients, but your call will be returned as soon as possible.  ?Phone  messages received after 4:00 PM Monday through Thursday may not be returned until the following business day. Phone messages received after 11:00 AM on Friday may not be returned until Monday.  ? ?After Hours ?We share on call hours with providers from other offices. If you have an urgent need after hours that cannot wait until the next business day, please contact the on call provider by calling the office number. A nurse will speak with  you and contact the provider if needed for recommendations.  ?If you have an urgent or life threatening emergency after hours, please do not call the on call provider, but seek emergency evaluation by calling 911 or going to the nearest emergency room for evaluation.  ? ?Paperwork ?All paperwork requires a minimum of 5 days to complete and return to you or the designated personnel. Please keep this in mind when bringing in forms or sending requests for paperwork completion to the office.  ?  ?

## 2022-03-15 LAB — UA/M W/RFLX CULTURE, COMP
Bilirubin, UA: NEGATIVE
Glucose, UA: NEGATIVE
Leukocytes,UA: NEGATIVE
Nitrite, UA: NEGATIVE
Protein,UA: NEGATIVE
RBC, UA: NEGATIVE
Specific Gravity, UA: 1.023 (ref 1.005–1.030)
Urobilinogen, Ur: 0.2 mg/dL (ref 0.2–1.0)
pH, UA: 5.5 (ref 5.0–7.5)

## 2022-03-15 LAB — CBC WITH DIFFERENTIAL/PLATELET
Basophils Absolute: 0 10*3/uL (ref 0.0–0.2)
Basos: 1 %
EOS (ABSOLUTE): 0.1 10*3/uL (ref 0.0–0.4)
Eos: 1 %
Hematocrit: 44.9 % (ref 37.5–51.0)
Hemoglobin: 15.4 g/dL (ref 13.0–17.7)
Immature Grans (Abs): 0 10*3/uL (ref 0.0–0.1)
Immature Granulocytes: 0 %
Lymphocytes Absolute: 4.2 10*3/uL — ABNORMAL HIGH (ref 0.7–3.1)
Lymphs: 51 %
MCH: 29.3 pg (ref 26.6–33.0)
MCHC: 34.3 g/dL (ref 31.5–35.7)
MCV: 85 fL (ref 79–97)
Monocytes Absolute: 0.6 10*3/uL (ref 0.1–0.9)
Monocytes: 7 %
Neutrophils Absolute: 3.2 10*3/uL (ref 1.4–7.0)
Neutrophils: 40 %
Platelets: 345 10*3/uL (ref 150–450)
RBC: 5.26 x10E6/uL (ref 4.14–5.80)
RDW: 13.1 % (ref 11.6–15.4)
WBC: 8.1 10*3/uL (ref 3.4–10.8)

## 2022-03-15 LAB — COMPREHENSIVE METABOLIC PANEL
ALT: 31 IU/L (ref 0–44)
AST: 26 IU/L (ref 0–40)
Albumin/Globulin Ratio: 1.6 (ref 1.2–2.2)
Albumin: 4.6 g/dL (ref 3.8–4.9)
Alkaline Phosphatase: 149 IU/L — ABNORMAL HIGH (ref 44–121)
BUN/Creatinine Ratio: 12 (ref 10–24)
BUN: 15 mg/dL (ref 8–27)
Bilirubin Total: 0.3 mg/dL (ref 0.0–1.2)
CO2: 23 mmol/L (ref 20–29)
Calcium: 9.8 mg/dL (ref 8.6–10.2)
Chloride: 102 mmol/L (ref 96–106)
Creatinine, Ser: 1.24 mg/dL (ref 0.76–1.27)
Globulin, Total: 2.9 g/dL (ref 1.5–4.5)
Glucose: 82 mg/dL (ref 70–99)
Potassium: 4.2 mmol/L (ref 3.5–5.2)
Sodium: 139 mmol/L (ref 134–144)
Total Protein: 7.5 g/dL (ref 6.0–8.5)
eGFR: 67 mL/min/{1.73_m2} (ref >59)

## 2022-03-15 LAB — MICROSCOPIC EXAMINATION
Bacteria, UA: NONE SEEN
Casts: NONE SEEN /lpf
Epithelial Cells (non renal): NONE SEEN /hpf (ref 0–10)
RBC, Urine: NONE SEEN /hpf (ref 0–2)
WBC, UA: NONE SEEN /hpf (ref 0–5)

## 2022-03-15 LAB — MICROALBUMIN / CREATININE URINE RATIO
Creatinine, Urine: 232.3 mg/dL
Microalb/Creat Ratio: 4 mg/g creat (ref 0–29)
Microalbumin, Urine: 9 ug/mL

## 2022-03-15 LAB — VITAMIN D 25 HYDROXY (VIT D DEFICIENCY, FRACTURES): Vit D, 25-Hydroxy: 23.3 ng/mL — ABNORMAL LOW (ref 30.0–100.0)

## 2022-03-15 LAB — HEMOGLOBIN A1C
Est. average glucose Bld gHb Est-mCnc: 131 mg/dL
Hgb A1c MFr Bld: 6.2 % — ABNORMAL HIGH (ref 4.8–5.6)

## 2022-03-15 LAB — HIV ANTIBODY (ROUTINE TESTING W REFLEX): HIV Screen 4th Generation wRfx: NONREACTIVE

## 2022-03-16 DIAGNOSIS — D239 Other benign neoplasm of skin, unspecified: Secondary | ICD-10-CM | POA: Insufficient documentation

## 2022-03-16 DIAGNOSIS — L723 Sebaceous cyst: Secondary | ICD-10-CM | POA: Insufficient documentation

## 2022-03-16 NOTE — Progress Notes (Signed)
?  Orma Render, DNP, AGNP-c ?Primary Care & Sports Medicine ?[place], [place] [address] ?[phone] ? ?Subjective:  ? ?[name] Fields is a 61 y.o. male presents to day for evaluation of 2 masses on the back. ?He has previously had 1 area drained.  No warmth, drainage, erythema, fevers, chills, muscle aches present.  ? ?PMH, Medications, and Allergies reviewed and updated in chart.  ? ?ROS negative except for what is listed in HPI. ?Objective:  ?Pulse 73   Ht '6\' 1"'$  (1.854 m)   Wt 249 lb (112.9 kg)   SpO2 97%   BMI 32.85 kg/m?  ?Physical Exam ?Skin: ? ?    ? ? ? ?    ? ?Assessment & Plan:  ? ?Problem List Items Addressed This Visit   ? ? Inflamed epidermoid cyst of skin - Primary  ?  Diagnosis: epidermal inclusion cyst - Location: Superior portion of posterior thoracic spine ?Procedure: Incision & drainage ?Informed consent:  Discussed risks (infection, pain, bleeding, bruising, numbness, and recurrence of the condition) and benefits of the procedure, as well as the alternatives.  Informed consent was obtained verbally. ?Anesthesia: 1% lidocaine with epinephrine buffered with 1:3 ratio of HCO3 ?Type: total ?The area was prepared and draped in a standard fashion. ?The lesion drained pus, blood and clots. ?A large amount of fluid was drained. ?Pieces of cyst wall were extracted. ?Antibiotic ointment and a sterile pressure dressing were applied. ?The patient tolerated the procedure well. ?The patient was instructed on post-op care. ?Recheck area in 1 week.  ? ?  ?  ? Dilated pore of Winer  ?  Diagnosis: Dilated Pore of Winer - Location: Posterior upper thoracic region- just inferior to epidermoid cyst ?Procedure: Incision & drainage ?Informed consent:  Discussed risks (infection, pain, bleeding, bruising, numbness, and recurrence of the condition) and benefits of the procedure, as well as the alternatives.  Informed consent was obtained verbally ?Anesthesia: 1%  lidocaine with epinephrine buffered with 1:3 ratio of HCO3 ?Type: total ?The area was prepared and draped in a standard fashion. ?The lesion drained white, chalky, cyst material. ?The lesion was multiloculated. ?Multiple cavities were opened and drained. ?Pieces of cyst wall were extracted. ?Antibiotic ointment and a sterile pressure dressing were applied. ?The patient tolerated the procedure well. ?The patient was instructed on post-op care. ?Patient to return in 1 week for monitoring.  ? ?  ?  ? ? ? ?History and Medication reviewed and updated this encounter.  ? ?Orma Render, DNP, AGNP-c ?03/16/2022  7:02 PM   ? ?

## 2022-03-16 NOTE — Assessment & Plan Note (Signed)
Diagnosis: Dilated Pore of Winer - Location: Posterior upper thoracic region- just inferior to epidermoid cyst ?Procedure: Incision & drainage ?Informed consent:  Discussed risks (infection, pain, bleeding, bruising, numbness, and recurrence of the condition) and benefits of the procedure, as well as the alternatives.  Informed consent was obtained verbally ?Anesthesia: 1% lidocaine with epinephrine buffered with 1:3 ratio of HCO3 ?Type: total ?The area was prepared and draped in a standard fashion. ?The lesion drained white, chalky, cyst material. ?The lesion was multiloculated. ?Multiple cavities were opened and drained. ?Pieces of cyst wall were extracted. ?Antibiotic ointment and a sterile pressure dressing were applied. ?The patient tolerated the procedure well. ?The patient was instructed on post-op care. ?Patient to return in 1 week for monitoring.  ? ?

## 2022-03-16 NOTE — Assessment & Plan Note (Addendum)
Diagnosis: epidermal inclusion cyst - Location: Superior portion of posterior thoracic spine ?Procedure: Incision & drainage ?Informed consent:  Discussed risks (infection, pain, bleeding, bruising, numbness, and recurrence of the condition) and benefits of the procedure, as well as the alternatives.  Informed consent was obtained verbally. ?Anesthesia: 1% lidocaine with epinephrine buffered with 1:3 ratio of HCO3 ?Type: total ?The area was prepared and draped in a standard fashion. ?The lesion drained pus, blood and clots. ?A large amount of fluid was drained. ?Pieces of cyst wall were extracted. ?Antibiotic ointment and a sterile pressure dressing were applied. ?The patient tolerated the procedure well. ?The patient was instructed on post-op care. ?Recheck area in 1 week.  ? ?

## 2022-03-17 ENCOUNTER — Encounter (HOSPITAL_BASED_OUTPATIENT_CLINIC_OR_DEPARTMENT_OTHER): Payer: Self-pay | Admitting: Nurse Practitioner

## 2022-03-18 MED ORDER — VITAMIN D (ERGOCALCIFEROL) 1.25 MG (50000 UNIT) PO CAPS: 50000.0000 [IU] | ORAL_CAPSULE | ORAL | 0 refills | Status: DC

## 2022-03-20 DIAGNOSIS — Z113 Encounter for screening for infections with a predominantly sexual mode of transmission: Secondary | ICD-10-CM | POA: Insufficient documentation

## 2022-03-20 DIAGNOSIS — Z0000 Encounter for general adult medical examination without abnormal findings: Secondary | ICD-10-CM | POA: Insufficient documentation

## 2022-03-20 DIAGNOSIS — E559 Vitamin D deficiency, unspecified: Secondary | ICD-10-CM | POA: Insufficient documentation

## 2022-03-20 DIAGNOSIS — Z122 Encounter for screening for malignant neoplasm of respiratory organs: Secondary | ICD-10-CM | POA: Insufficient documentation

## 2022-03-20 NOTE — Assessment & Plan Note (Signed)
Healing well. Drainage present consistent with normal healing from the inside out. No signs of infection. Areas cleansed and covered in bandage today. Recommend keeping bandage over area until drainage stops. Monitor for signs of infection or recurrence of cystic lesion and report immediately.

## 2022-04-05 NOTE — Telephone Encounter (Signed)
Urine sample has been schedule for this Friday 6/9

## 2022-04-08 ENCOUNTER — Ambulatory Visit (HOSPITAL_BASED_OUTPATIENT_CLINIC_OR_DEPARTMENT_OTHER): Payer: BC Managed Care – PPO

## 2022-04-08 ENCOUNTER — Ambulatory Visit (INDEPENDENT_AMBULATORY_CARE_PROVIDER_SITE_OTHER): Payer: BC Managed Care – PPO

## 2022-04-08 DIAGNOSIS — I429 Cardiomyopathy, unspecified: Secondary | ICD-10-CM | POA: Diagnosis not present

## 2022-04-11 ENCOUNTER — Ambulatory Visit (HOSPITAL_BASED_OUTPATIENT_CLINIC_OR_DEPARTMENT_OTHER): Payer: BC Managed Care – PPO

## 2022-04-11 DIAGNOSIS — Z113 Encounter for screening for infections with a predominantly sexual mode of transmission: Secondary | ICD-10-CM

## 2022-04-12 LAB — CUP PACEART REMOTE DEVICE CHECK
Battery Remaining Longevity: 109 mo
Battery Remaining Percentage: 95.5 %
Battery Voltage: 3.02 V
Brady Statistic AP VP Percent: 1 %
Brady Statistic AP VS Percent: 1 %
Brady Statistic AS VP Percent: 9.5 %
Brady Statistic AS VS Percent: 89 %
Brady Statistic RA Percent Paced: 1.4 %
Brady Statistic RV Percent Paced: 10 %
Date Time Interrogation Session: 20230612015022
Implantable Lead Implant Date: 19990719
Implantable Lead Implant Date: 19990719
Implantable Lead Location: 753859
Implantable Lead Location: 753860
Implantable Pulse Generator Implant Date: 20220907
Lead Channel Impedance Value: 380 Ohm
Lead Channel Impedance Value: 380 Ohm
Lead Channel Pacing Threshold Amplitude: 0.5 V
Lead Channel Pacing Threshold Amplitude: 1 V
Lead Channel Pacing Threshold Pulse Width: 0.5 ms
Lead Channel Pacing Threshold Pulse Width: 0.5 ms
Lead Channel Sensing Intrinsic Amplitude: 5 mV
Lead Channel Sensing Intrinsic Amplitude: 9.7 mV
Lead Channel Setting Pacing Amplitude: 2 V
Lead Channel Setting Pacing Amplitude: 2.5 V
Lead Channel Setting Pacing Pulse Width: 0.5 ms
Lead Channel Setting Sensing Sensitivity: 2 mV
Pulse Gen Model: 2272
Pulse Gen Serial Number: 6519619

## 2022-04-14 NOTE — Progress Notes (Signed)
Remote pacemaker transmission.   

## 2022-04-15 LAB — CT, NG, MYCOPLASMAS NAA, URINE
Chlamydia trachomatis, NAA: NEGATIVE
Mycoplasma genitalium NAA: NEGATIVE
Mycoplasma hominis NAA: NEGATIVE
Neisseria gonorrhoeae, NAA: NEGATIVE
Ureaplasma spp NAA: POSITIVE — AB

## 2022-04-16 ENCOUNTER — Encounter (HOSPITAL_BASED_OUTPATIENT_CLINIC_OR_DEPARTMENT_OTHER): Payer: Self-pay | Admitting: Nurse Practitioner

## 2022-04-20 ENCOUNTER — Telehealth (HOSPITAL_BASED_OUTPATIENT_CLINIC_OR_DEPARTMENT_OTHER): Payer: Self-pay

## 2022-04-20 DIAGNOSIS — A493 Mycoplasma infection, unspecified site: Secondary | ICD-10-CM

## 2022-04-20 MED ORDER — AZITHROMYCIN 250 MG PO TABS: ORAL_TABLET | ORAL | 0 refills | Status: AC

## 2022-05-26 ENCOUNTER — Ambulatory Visit (HOSPITAL_BASED_OUTPATIENT_CLINIC_OR_DEPARTMENT_OTHER)
Admission: RE | Admit: 2022-05-26 | Discharge: 2022-05-26 | Disposition: A | Discharge status: Home / Self Care | Payer: No Typology Code available for payment source | Source: Ambulatory Visit | Attending: Family Medicine | Admitting: Family Medicine

## 2022-05-26 ENCOUNTER — Other Ambulatory Visit: Payer: Self-pay

## 2022-05-26 ENCOUNTER — Encounter (HOSPITAL_BASED_OUTPATIENT_CLINIC_OR_DEPARTMENT_OTHER): Payer: Self-pay | Admitting: Family Medicine

## 2022-05-26 ENCOUNTER — Ambulatory Visit (HOSPITAL_BASED_OUTPATIENT_CLINIC_OR_DEPARTMENT_OTHER): Payer: No Typology Code available for payment source

## 2022-05-26 ENCOUNTER — Encounter (HOSPITAL_BASED_OUTPATIENT_CLINIC_OR_DEPARTMENT_OTHER): Payer: Self-pay

## 2022-05-26 ENCOUNTER — Ambulatory Visit (INDEPENDENT_AMBULATORY_CARE_PROVIDER_SITE_OTHER): Payer: No Typology Code available for payment source | Admitting: Family Medicine

## 2022-05-26 ENCOUNTER — Ambulatory Visit (INDEPENDENT_AMBULATORY_CARE_PROVIDER_SITE_OTHER): Payer: No Typology Code available for payment source

## 2022-05-26 DIAGNOSIS — M25561 Pain in right knee: Secondary | ICD-10-CM

## 2022-05-26 DIAGNOSIS — M25461 Effusion, right knee: Secondary | ICD-10-CM | POA: Diagnosis not present

## 2022-05-26 NOTE — Progress Notes (Signed)
    Procedures performed today:    None.  Independent interpretation of notes and tests performed by another provider:   None.  Brief History, Exam, Impression, and Recommendations:    BP 137/85   Pulse 79   Ht '6\' 1"'$  (1.854 m)   Wt 250 lb (113.4 kg)   SpO2 100%   BMI 32.98 kg/m   Right knee pain Patient presents for evaluation following recent fall. A couple days ago, he was walking down stairs and tripped and fell down the stairs. Injured his right leg and knee. No head injury. Has been having pain in his right knee up into right thigh. Has "just dealt with it" when it comes to the pain. He has notable tenderness to palpation along anterior joint line of the right knee, primarily medial aspect.  There is mild swelling of the joint as well.  Pain with passive range of motion, particularly at extension.  He does not have any tenderness about the right hip.  No tenderness over posterior calf.  No edema noted. Given recent trauma, notable pain, knee swelling, there is concern for potential underlying fracture.  We will proceed with initial x-ray imaging of right knee.  In the meantime, pain is generally under control, could utilize OTC medications for pain control as needed, has not been requiring at present Further recommendations regarding management pending results of x-ray imaging If fracture observed, may need referral to orthopedic surgeon for further management If no fracture observed, will need to manage pain in the acute time period and monitor progress moving forward  Return if symptoms worsen or fail to improve.   ___________________________________________ Zaya Kessenich de [place], MD, ABFM, CAQSM [facility]

## 2022-05-26 NOTE — Patient Instructions (Signed)
  Medication Instructions:  Your physician recommends that you continue on your current medications as directed. Please refer to the Current Medication list given to you today. --If you need a refill on any your medications before your next appointment, please call your pharmacy first. If no refills are authorized on file call the office.-- Lab Work: Your physician has recommended that you have lab work today: No If you have labs (blood work) drawn today and your tests are completely normal, you will receive your results via MyChart message OR a phone call from our staff.  Please ensure you check your voicemail in the event that you authorized detailed messages to be left on a delegated number. If you have any lab test that is abnormal or we need to change your treatment, we will call you to review the results.  Referrals/Procedures/Imaging: Yes  Follow-Up: Your next appointment:   Your physician recommends that you schedule a follow-up appointment as needed with Dr. de [place].  You will receive a text message or e-mail with a link to a survey about your care and experience with us today! We would greatly appreciate your feedback!   Thanks for letting us be apart of your health journey!!  Primary Care and Sports Medicine   Dr. Raymond de Cuba   We encourage you to activate your patient portal called "MyChart".  Sign up information is provided on this After Visit Summary.  MyChart is used to connect with patients for Virtual Visits (Telemedicine).  Patients are able to view lab/test results, encounter notes, upcoming appointments, etc.  Non-urgent messages can be sent to your provider as well. To learn more about what you can do with MyChart, please visit --  [email or web address].    

## 2022-05-26 NOTE — Assessment & Plan Note (Addendum)
Patient presents for evaluation following recent fall. A couple days ago, he was walking down stairs and tripped and fell down the stairs. Injured his right leg and knee. No head injury. Has been having pain in his right knee up into right thigh. Has "just dealt with it" when it comes to the pain. He has notable tenderness to palpation along anterior joint line of the right knee, primarily medial aspect.  There is mild swelling of the joint as well.  Pain with passive range of motion, particularly at extension.  He does not have any tenderness about the right hip.  No tenderness over posterior calf.  No edema noted. Given recent trauma, notable pain, knee swelling, there is concern for potential underlying fracture.  We will proceed with initial x-ray imaging of right knee.  In the meantime, pain is generally under control, could utilize OTC medications for pain control as needed, has not been requiring at present Further recommendations regarding management pending results of x-ray imaging If fracture observed, may need referral to orthopedic surgeon for further management If no fracture observed, will need to manage pain in the acute time period and monitor progress moving forward

## 2022-05-27 ENCOUNTER — Telehealth (HOSPITAL_BASED_OUTPATIENT_CLINIC_OR_DEPARTMENT_OTHER): Payer: Self-pay | Admitting: Nurse Practitioner

## 2022-05-27 NOTE — Telephone Encounter (Signed)
Pt called regarding wanting to know the results from imaging he had done yesterday 7/27 downstairs in imaging.   Let pt know the process for imaging results that we have to wait for a radiologist to look at it then it goes to the provider and we give the provider 7-10 days to look over results and then the provider will call the patient of have the [facility] call with report and their notes on it. Pt showed understanding. Would like a call when it is reviewed.  Please advise.

## 2022-06-16 NOTE — Telephone Encounter (Signed)
Pt is calling back to advise that he would like to go forward with the MRI    Per Dr Ma Hillock notes  If pain does persist or if any worsening does occur, we will proceed with ordering MRI.  He will let us know if any of these changes do occur.

## 2022-06-17 NOTE — Addendum Note (Signed)
Addended by: DE [place], Kyung Rudd J on: 06/17/2022 11:58 AM   Modules accepted: Orders

## 2022-06-23 ENCOUNTER — Telehealth (HOSPITAL_BASED_OUTPATIENT_CLINIC_OR_DEPARTMENT_OTHER): Payer: Self-pay

## 2022-06-23 DIAGNOSIS — M25461 Effusion, right knee: Secondary | ICD-10-CM

## 2022-06-23 DIAGNOSIS — M25561 Pain in right knee: Secondary | ICD-10-CM

## 2022-07-05 ENCOUNTER — Telehealth: Payer: Self-pay | Admitting: Internal Medicine

## 2022-07-05 NOTE — Telephone Encounter (Signed)
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 He ask that it be fax to [phone]. Please advise

## 2022-07-06 NOTE — Telephone Encounter (Addendum)
Spoke with pt and advised he is due to be seen this month by Dr Caryl Comes or his PA.  Pt states he needs a letter, same as last year to provide to his employer to continue working.  Pt denies CP, SOB, dizziness or swelling.  He does report he fell down some steps in July and injured his knee.  He is awaiting clearance for an MRI by his PCP.  He states does have localized swelling in his knee and has to take Ibuprofen from time to time for the pain and swelling. Pt advised to continue follow up with PCP re: pain and swelling of knee. Pt advised will have Dr Olin Pia scheduler call him for an appointment and RN will complete letter and contact pt when letter has been signed by Dr Caryl Comes.  Pt verbalizes understanding and agrees with current plan.

## 2022-07-07 ENCOUNTER — Telehealth: Payer: Self-pay | Admitting: Internal Medicine

## 2022-07-07 NOTE — Telephone Encounter (Signed)
Letter completed and faxed to number provided by pt as requested.  See letter for complete details.  Hard copy of letter mailed to pt.  Pt notified of the above information and thanked Therapist, sports for the assistance.

## 2022-07-07 NOTE — Telephone Encounter (Signed)
Pt states that he fell and hurt his leg and had been taking Motrin with Ibuprofen. Pt states that he read that medication may not be good for him to take. He would like a callback regarding what he is able to take for pain. Please advise

## 2022-07-07 NOTE — Telephone Encounter (Addendum)
RN spoke with pt earlier and advised him to contact his PCP who is managing knee injury to discuss medication as he has been taking Ibuprofen for pain and swelling.  He is awaiting MRI.  Pt states he is no longer taking Tramadol.  Pt verbalized understanding and agreed with current plan.  See also encounter dated 07/06/2022.

## 2022-07-08 ENCOUNTER — Ambulatory Visit (INDEPENDENT_AMBULATORY_CARE_PROVIDER_SITE_OTHER): Payer: BC Managed Care – PPO

## 2022-07-08 DIAGNOSIS — I440 Atrioventricular block, first degree: Secondary | ICD-10-CM

## 2022-07-12 ENCOUNTER — Encounter (HOSPITAL_BASED_OUTPATIENT_CLINIC_OR_DEPARTMENT_OTHER): Payer: Self-pay | Admitting: Family Medicine

## 2022-07-12 DIAGNOSIS — M25561 Pain in right knee: Secondary | ICD-10-CM

## 2022-07-12 DIAGNOSIS — M25461 Effusion, right knee: Secondary | ICD-10-CM

## 2022-07-12 LAB — CUP PACEART REMOTE DEVICE CHECK
Battery Remaining Longevity: 107 mo
Battery Remaining Percentage: 94 %
Battery Voltage: 3.02 V
Brady Statistic AP VP Percent: 1 %
Brady Statistic AP VS Percent: 1 %
Brady Statistic AS VP Percent: 9.4 %
Brady Statistic AS VS Percent: 89 %
Brady Statistic RA Percent Paced: 1.4 %
Brady Statistic RV Percent Paced: 10 %
Date Time Interrogation Session: 20230909024807
Implantable Lead Implant Date: 19990719
Implantable Lead Implant Date: 19990719
Implantable Lead Location: 753859
Implantable Lead Location: 753860
Implantable Pulse Generator Implant Date: 20220907
Lead Channel Impedance Value: 380 Ohm
Lead Channel Impedance Value: 390 Ohm
Lead Channel Pacing Threshold Amplitude: 0.5 V
Lead Channel Pacing Threshold Amplitude: 1 V
Lead Channel Pacing Threshold Pulse Width: 0.5 ms
Lead Channel Pacing Threshold Pulse Width: 0.5 ms
Lead Channel Sensing Intrinsic Amplitude: 10.5 mV
Lead Channel Sensing Intrinsic Amplitude: 3.9 mV
Lead Channel Setting Pacing Amplitude: 2 V
Lead Channel Setting Pacing Amplitude: 2.5 V
Lead Channel Setting Pacing Pulse Width: 0.5 ms
Lead Channel Setting Sensing Sensitivity: 2 mV
Pulse Gen Model: 2272
Pulse Gen Serial Number: 6519619

## 2022-07-14 NOTE — Progress Notes (Unsigned)
Electrophysiology Office Note Date: 07/15/2022  ID:  Fanny Bien, DOB 1961-09-07, MRN [id number]  PCP: Orma Render, NP Primary Cardiologist: None Electrophysiologist: Virl Axe, MD   CC: Pacemaker follow-up  [name] Fields is a 61 y.o. male seen today for Virl Axe, MD for routine electrophysiology followup. Since last being seen in our clinic the patient reports doing very well.  he denies chest pain, palpitations, dyspnea, PND, orthopnea, nausea, vomiting, dizziness, syncope, edema, weight gain, or early satiety.   Device History: St. [employer], minerals PPM implanted 1999 and gen change 2011 for high grade heart block  Past Medical History:  Diagnosis Date   Allergy to ACE inhibitors    Atrial tachycardia (HCC)    Cardiomyopathy, nonischemic (HCC)    GERD (gastroesophageal reflux disease)    Hiatal hernia    High-grade second-degree heart block     Qualifier: Diagnosis of  By: Caryl Comes, MD, Remus Blake    Hypertension    Permanent pacemaker-St. [employer]; gen re in the summer he has o pain syndrome BP 135% at time long so is his we have been short of his device is in a in a in a Foley disease since the stents removed about 3.0-4 seconds on the episode the patient is very 1 and a [place] when his pacemaker but when he and his placement 2011   Presence of permanent cardiac pacemaker    Sleep apnea    uses CPAP sometimes   WPW (Wolff-Parkinson-White syndrome)    s/pRFCA   Past Surgical History:  Procedure Laterality Date   CARDIAC DEFIBRILLATOR PLACEMENT  2011   St Jude -- [employer] RF   CARPAL TUNNEL RELEASE Left 08/18/2020   Procedure: CARPAL TUNNEL RELEASE;  Surgeon: Daryll Brod, MD;  Location: [facility];  Service: Orthopedics;  Laterality: Left;  IV REGIONAL FOREARM BLOCK   CARPAL TUNNEL RELEASE Right 12/01/2020   Procedure: CARPAL TUNNEL RELEASE;  Surgeon: Daryll Brod, MD;  Location: [facility];  Service: Orthopedics;   Laterality: Right;  IV REGIONAL FOREARM BLOCK   PACEMAKER INSERTION  1999   explanted 2011   PPM GENERATOR CHANGEOUT N/A 07/07/2021   Procedure: PPM GENERATOR CHANGEOUT;  Surgeon: [name], MD;  Location: [facility];  Service: Cardiovascular;  Laterality: N/A;    Current Outpatient Medications  Medication Sig Dispense Refill   Ascorbic Acid (VITAMIN C) 1000 MG tablet Take 1,000 mg by mouth Fields.     fluticasone (FLONASE) 50 MCG/ACT nasal spray Place into both nostrils as needed for allergies or rhinitis.     hydrochlorothiazide (HYDRODIURIL) 25 MG tablet TAKE ONE TABLET BY MOUTH ONE TIME Fields 90 tablet 0   Vitamin D, Ergocalciferol, (DRISDOL) 1.25 MG (50000 UNIT) CAPS capsule Take 1 capsule (50,000 Units total) by mouth every 7 (seven) days. Continue for 6 months.  We will recheck labs when complete. 24 capsule 0   No current facility-administered medications for this visit.    Allergies:   Lisinopril   Social History: Social History   Socioeconomic History   Marital status: Married    Spouse name: Not on file   Number of children: Not on file   Years of education: Not on file   Highest education level: Not on file  Occupational History   Occupation: truck driver    Employer: [employer]  Tobacco Use   Smoking status: Every Day    Packs/day:  0.50    Years: 43.00    Total pack years: 21.50    Types: Cigarettes    Start date: 1978   Smokeless tobacco: Never   Tobacco comments:    Pt has stopped and satated back  Vaping Use   Vaping Use: Never used  Substance and Sexual Activity   Alcohol use: Yes    Alcohol/week: 7.0 standard drinks of alcohol    Types: 7 Standard drinks or equivalent per week    Comment: social   Drug use: No   Sexual activity: Yes  Other Topics Concern   Not on file  Social History Narrative   Not on file   Social Determinants of Health   Financial Resource Strain: Not on file  Food Insecurity: Not on file  Transportation  Needs: Not on file  Physical Activity: Not on file  Stress: Not on file  Social Connections: Not on file  Intimate Partner Violence: Not on file    Family History: Family History  Problem Relation Age of Onset   Breast cancer Mother    Heart disease Mother    Heart Problems Mother        pacemaker   Prostate cancer Father    Hypertension Father      Review of Systems: All other systems reviewed and are otherwise negative except as noted above.  Physical Exam: Vitals:   07/15/22 0857 07/15/22 0925  BP: (!) 154/78 130/80  Pulse: 66   SpO2: 98%   Weight: 240 lb 9.6 oz (109.1 kg)   Height: '6\' 1"'$  (1.854 m)      GEN- The patient is well appearing, alert and oriented x 3 today.   HEENT: normocephalic, atraumatic; sclera clear, conjunctiva pink; hearing intact; oropharynx clear; neck supple, no JVP Lymph- no cervical lymphadenopathy Lungs- Clear to ausculation bilaterally, normal work of breathing.  No wheezes, rales, rhonchi Heart- Regular  rate and rhythm, no murmurs, rubs or gallops, PMI not laterally displaced GI- soft, non-tender, non-distended, bowel sounds present, no hepatosplenomegaly Extremities- no clubbing or cyanosis. No peripheral edema; DP/PT/radial pulses 2+ bilaterally MS- no significant deformity or atrophy Skin- warm and dry, no rash or lesion; PPM pocket well healed Psych- euthymic mood, full affect Neuro- strength and sensation are intact  PPM Interrogation-  reviewed in detail today,  See PACEART report.  EKG:  EKG is not ordered today.  Recent Labs: 03/14/2022: ALT 31; BUN 15; Creatinine, Ser 1.24; Hemoglobin 15.4; Platelets 345; Potassium 4.2; Sodium 139   Wt Readings from Last 3 Encounters:  07/15/22 240 lb 9.6 oz (109.1 kg)  05/26/22 250 lb (113.4 kg)  03/14/22 253 lb 9.6 oz (115 kg)     Other studies Reviewed: Additional studies/ records that were reviewed today include: Previous EP office notes, Previous remote checks, Most recent labwork.    Assessment and Plan:  1. Second Degree AV block s/p  St Jude  PPM  Normal PPM function See Pace Art report No changes today  2. Cardiomyopathy - resolved LV hypertrophy Echo 03/2021 with LVEF 60-65% No symptoms or concern at this time.    3. HTN Elevated on arrival, pt states he only just took [facility] before coming.  Recheck BP 130/80. Discussed addition of beta blocker with rare NST/SVT. Pt declines  Current medicines are reviewed at length with the patient today.    Disposition:   Follow up with Dr. Caryl Comes in 12 months    Signed, Shirley Friar, PA-C  07/15/2022 9:26 AM  [facility]  [address] [place] [address] [address] [phone] (office) [phone] (fax)

## 2022-07-15 ENCOUNTER — Encounter: Payer: Self-pay | Admitting: Student

## 2022-07-15 ENCOUNTER — Ambulatory Visit: Payer: BC Managed Care – PPO | Attending: Student | Admitting: Student

## 2022-07-15 VITALS — BP 130/80 | HR 66 | Ht 73.0 in | Wt 240.6 lb

## 2022-07-15 DIAGNOSIS — I428 Other cardiomyopathies: Secondary | ICD-10-CM | POA: Diagnosis not present

## 2022-07-15 DIAGNOSIS — I441 Atrioventricular block, second degree: Secondary | ICD-10-CM | POA: Diagnosis not present

## 2022-07-15 DIAGNOSIS — I429 Cardiomyopathy, unspecified: Secondary | ICD-10-CM

## 2022-07-15 LAB — CUP PACEART INCLINIC DEVICE CHECK
Battery Remaining Longevity: 121 mo
Battery Voltage: 3.02 V
Brady Statistic RA Percent Paced: 1.5 %
Brady Statistic RV Percent Paced: 10 %
Date Time Interrogation Session: 20230915092703
Implantable Lead Implant Date: 19990719
Implantable Lead Implant Date: 19990719
Implantable Lead Location: 753859
Implantable Lead Location: 753860
Implantable Pulse Generator Implant Date: 20220907
Lead Channel Impedance Value: 375 Ohm
Lead Channel Impedance Value: 375 Ohm
Lead Channel Pacing Threshold Amplitude: 0.5 V
Lead Channel Pacing Threshold Amplitude: 0.5 V
Lead Channel Pacing Threshold Amplitude: 1 V
Lead Channel Pacing Threshold Amplitude: 1 V
Lead Channel Pacing Threshold Pulse Width: 0.5 ms
Lead Channel Pacing Threshold Pulse Width: 0.5 ms
Lead Channel Pacing Threshold Pulse Width: 0.5 ms
Lead Channel Pacing Threshold Pulse Width: 0.5 ms
Lead Channel Sensing Intrinsic Amplitude: 10.9 mV
Lead Channel Sensing Intrinsic Amplitude: 5 mV
Lead Channel Setting Pacing Amplitude: 2 V
Lead Channel Setting Pacing Amplitude: 2.5 V
Lead Channel Setting Pacing Pulse Width: 0.5 ms
Lead Channel Setting Sensing Sensitivity: 2 mV
Pulse Gen Model: 2272
Pulse Gen Serial Number: 6519619

## 2022-07-15 NOTE — Patient Instructions (Signed)
Medication Instructions:  Your physician recommends that you continue on your current medications as directed. Please refer to the Current Medication list given to you today.  *If you need a refill on your cardiac medications before your next appointment, please call your pharmacy*   Lab Work: None If you have labs (blood work) drawn today and your tests are completely normal, you will receive your results only by: [place] (if you have MyChart) OR A paper copy in the mail If you have any lab test that is abnormal or we need to change your treatment, we will call you to review the results.   Follow-Up: At [facility], you and your health needs are our priority.  As part of our continuing mission to provide you with exceptional heart care, we have created designated Provider Care Teams.  These Care Teams include your primary Cardiologist (physician) and Advanced Practice Providers (APPs -  Physician Assistants and Nurse Practitioners) who all work together to provide you with the care you need, when you need it.   Your next appointment:   1 year(s)  The format for your next appointment:   In Person  Provider:   Virl Axe, MD

## 2022-07-21 ENCOUNTER — Telehealth: Payer: Self-pay | Admitting: *Deleted

## 2022-07-21 NOTE — Telephone Encounter (Signed)
   Pre-operative Risk Assessment    Patient Name: [name] Fields  DOB: 1961-07-07 MRN: [id number]      Request for Surgical Clearance    Procedure:   REDO RT CTR POSSIBLE HYPOTHENOR FAT PAD FLAP VS NERVE WRAP RT LACECTUS FIBROSIS RELEASE RT ELBOW  Date of Surgery:  Clearance 08/24/22                                 Surgeon:  DR. Charlotte Crumb Surgeon's Group or Practice Name:  [facility] Phone number:  [phone] Fax number:  [phone]   Type of Clearance Requested:   - Medical    Type of Anesthesia:  General    Additional requests/questions:    Astrid Divine   07/21/2022, 10:28 AM

## 2022-07-21 NOTE — Telephone Encounter (Signed)
Needing more information regarding procedure to make a decision whether it will interfere with pt's Pacemaker.    Will route to requesting surgeon's office to  make them aware.

## 2022-07-22 NOTE — Progress Notes (Signed)
Remote pacemaker transmission.   

## 2022-07-22 NOTE — Telephone Encounter (Signed)
Caller is following up on what further information they need to provide to get clearance for this patient.

## 2022-07-22 NOTE — Telephone Encounter (Signed)
I s/w Rhonda at the [facility]. Clarified procedure to be done.  RE-DO ON RIGHT CARPAL TUNNEL RELEASE, WITH FLAP PLACED OVER FAT PAD; AS WELL AS RIGHT ELBOW RELEASE.   I WILL FORWARD THIS BACK TO PRE OP AS PROCEDURE HAS BEEN CLARIFIED

## 2022-07-24 ENCOUNTER — Ambulatory Visit (HOSPITAL_BASED_OUTPATIENT_CLINIC_OR_DEPARTMENT_OTHER)
Admission: RE | Admit: 2022-07-24 | Discharge: 2022-07-24 | Disposition: A | Discharge status: Home / Self Care | Payer: BC Managed Care – PPO | Source: Ambulatory Visit | Attending: Family Medicine | Admitting: Family Medicine

## 2022-07-24 DIAGNOSIS — M25561 Pain in right knee: Secondary | ICD-10-CM | POA: Diagnosis present

## 2022-07-24 DIAGNOSIS — M25461 Effusion, right knee: Secondary | ICD-10-CM | POA: Diagnosis present

## 2022-07-25 NOTE — Telephone Encounter (Signed)
Place a magnet over the device.

## 2022-07-26 NOTE — Telephone Encounter (Signed)
   Patient Name: [name] Fields  DOB: 11/01/1960 MRN: [id number]  Primary Cardiologist: None  Chart reviewed as part of pre-operative protocol coverage. Given past medical history and time since last visit, based on ACC/AHA guidelines, [name] Fields would be at acceptable risk for the planned procedure without further cardiovascular testing.   Per EP team, place magnet over PPM during procedure.   The patient was advised that if he develops new symptoms prior to surgery to contact our office to arrange for a follow-up visit, and he verbalized understanding.  I will route this recommendation to the requesting party via Epic fax function and remove from pre-op pool.  Please call with questions.  Loel Dubonnet, NP 07/26/2022, 12:55 PM

## 2022-07-29 ENCOUNTER — Other Ambulatory Visit (HOSPITAL_BASED_OUTPATIENT_CLINIC_OR_DEPARTMENT_OTHER): Payer: Self-pay | Admitting: Family Medicine

## 2022-07-29 DIAGNOSIS — M25561 Pain in right knee: Secondary | ICD-10-CM

## 2022-07-29 DIAGNOSIS — M25461 Effusion, right knee: Secondary | ICD-10-CM

## 2022-08-01 ENCOUNTER — Ambulatory Visit (HOSPITAL_BASED_OUTPATIENT_CLINIC_OR_DEPARTMENT_OTHER): Payer: BC Managed Care – PPO | Admitting: Orthopaedic Surgery

## 2022-08-03 ENCOUNTER — Ambulatory Visit (INDEPENDENT_AMBULATORY_CARE_PROVIDER_SITE_OTHER): Payer: BC Managed Care – PPO | Admitting: Orthopaedic Surgery

## 2022-08-03 DIAGNOSIS — M2391 Unspecified internal derangement of right knee: Secondary | ICD-10-CM | POA: Diagnosis not present

## 2022-08-03 NOTE — Progress Notes (Signed)
Chief Complaint: Right knee pain     History of Present Illness:    [name] Fields is a 61 y.o. male presents today after a fall on July 25 when he fell down the steps.  Since that time he has had pain and swelling about the medial joint space.  He has had a limp and feels like the knee is giving out on the side.  He was seeing Dr. [place] performed an an x-ray and a CT scan but he is unable to get an MRI as he has a pacemaker that is not compatible.  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    Surgical History:   None  PMH/PSH/Family History/Social History/Meds/Allergies:    Past Medical History:  Diagnosis Date   Allergy to ACE inhibitors    Atrial tachycardia (HCC)    Cardiomyopathy, nonischemic (HCC)    GERD (gastroesophageal reflux disease)    Hiatal hernia    High-grade second-degree heart block     Qualifier: Diagnosis of  By: Caryl Comes, MD, Remus Blake    Hypertension    Permanent pacemaker-[employer] [place]; gen re in the summer he has o pain syndrome BP 135% at time long so is his we have been short of his device is in a in a in a Foley disease since the stents removed about 3.0-4 seconds on the episode the patient is very 2 and a [place] when his pacemaker but when he and his placement 2011   Presence of permanent cardiac pacemaker    Sleep apnea    uses CPAP sometimes   WPW (Wolff-Parkinson-White syndrome)    s/pRFCA   Past Surgical History:  Procedure Laterality Date   CARDIAC DEFIBRILLATOR PLACEMENT  2011   St Jude -- [employer] RF   CARPAL TUNNEL RELEASE Left 08/18/2020   Procedure: CARPAL TUNNEL RELEASE;  Surgeon: Daryll Brod, MD;  Location: [facility];  Service: Orthopedics;  Laterality: Left;  IV REGIONAL FOREARM BLOCK   CARPAL TUNNEL RELEASE Right 12/01/2020   Procedure: CARPAL TUNNEL RELEASE;  Surgeon: Daryll Brod, MD;  Location: [facility];   Service: Orthopedics;  Laterality: Right;  IV REGIONAL FOREARM BLOCK   PACEMAKER INSERTION  1999   explanted 2011   PPM GENERATOR CHANGEOUT N/A 07/07/2021   Procedure: PPM GENERATOR CHANGEOUT;  Surgeon: [name], MD;  Location: [facility];  Service: Cardiovascular;  Laterality: N/A;   Social History   Socioeconomic History   Marital status: Legally Separated    Spouse name: Not on file   Number of children: Not on file   Years of education: Not on file   Highest education level: Not on file  Occupational History   Occupation: truck driver    Employer: [employer]  Tobacco Use   Smoking status: Every Day    Packs/day: 0.50    Years: 43.00    Total pack years: 21.50    Types: Cigarettes    Start date: 1978   Smokeless tobacco: Never   Tobacco comments:    Pt has stopped and satated back  Vaping Use   Vaping Use: Never used  Substance and Sexual Activity   Alcohol use: Yes  Alcohol/week: 7.0 standard drinks of alcohol    Types: 7 Standard drinks or equivalent per week    Comment: social   Drug use: No   Sexual activity: Yes  Other Topics Concern   Not on file  Social History Narrative   Not on file   Social Determinants of Health   Financial Resource Strain: Not on file  Food Insecurity: Not on file  Transportation Needs: Not on file  Physical Activity: Not on file  Stress: Not on file  Social Connections: Not on file   Family History  Problem Relation Age of Onset   Breast cancer Mother    Heart disease Mother    Heart Problems Mother        pacemaker   Prostate cancer Father    Hypertension Father    Allergies  Allergen Reactions   Lisinopril Swelling    Lip swelling   Current Outpatient Medications  Medication Sig Dispense Refill   Ascorbic Acid (VITAMIN C) 1000 MG tablet Take 1,000 mg by mouth daily.     fluticasone (FLONASE) 50 MCG/ACT nasal spray Place into both nostrils as needed for allergies or rhinitis.      hydrochlorothiazide (HYDRODIURIL) 25 MG tablet TAKE ONE TABLET BY MOUTH ONE TIME DAILY 90 tablet 0   Vitamin D, Ergocalciferol, (DRISDOL) 1.25 MG (50000 UNIT) CAPS capsule Take 1 capsule (50,000 Units total) by mouth every 7 (seven) days. Continue for 6 months.  We will recheck labs when complete. 24 capsule 0   No current facility-administered medications for this visit.   No results found.  Review of Systems:   A ROS was performed including pertinent positives and negatives as documented in the HPI.  Physical Exam :   Constitutional: NAD and appears stated age Neurological: Alert and oriented Psych: Appropriate affect and cooperative There were no vitals taken for this visit.   Comprehensive Musculoskeletal Exam:      Musculoskeletal Exam  Gait Normal  Alignment Normal   Right Left  Inspection Normal Normal  Palpation    Tenderness Medial joint line None  Crepitus None None  Effusion Positive Negative  Range of Motion    Extension -3 -3  Flexion 130 130  Strength    Extension 5/5 5/5  Flexion 5/5 5/5  Ligament Exam     Generalized Laxity No No  Lachman Negative Negative   Pivot Shift Negative Negative  Anterior Drawer Negative Negative  Valgus at 0 Negative Negative  Valgus at 20 Negative Negative  Varus at 0 0 0  Varus at 20   0 0  Posterior Drawer at 90 0 0  Vascular/Lymphatic Exam    Edema None None  Venous Stasis Changes No No  Distal Circulation Normal Normal  Neurologic    Light Touch Sensation Intact Intact  Special Tests:     Imaging:   Xray (right knee 4 views): Normal  CT (right knee): There is a large knee effusion with a suprapatellar loose body  I personally reviewed and interpreted the radiographs.   Assessment:   61 y.o. male with right knee pain and medial joint space consistent with possible meniscal injury as well as loose body in the suprapatellar pouch.  At this time I have advised that given the fact that he is unable to obtain an  MRI I would recommend a ultrasound-guided steroid injection into the right knee.  Ultrasound-guided steroid injection into the right knee.  Hoping this will take care of his pain and swelling.  Discussed that if he does not get any relief from the CAD candidate for diagnostic arthroscopy if he does continue to have persistent knee pain.  I did provide him with a home knee strengthening exercise program that he will begin.  Plan :    -Return to clinic in 6 weeks     I personally saw and evaluated the patient, and participated in the management and treatment plan.  Vanetta Mulders, MD Attending Physician, Orthopedic Surgery  This document was dictated using Dragon voice recognition software. A reasonable attempt at proof reading has been made to minimize errors.

## 2022-08-12 NOTE — Telephone Encounter (Signed)
Patient called and requested referral. Referral was sent.

## 2022-08-23 ENCOUNTER — Ambulatory Visit (HOSPITAL_BASED_OUTPATIENT_CLINIC_OR_DEPARTMENT_OTHER): Payer: BC Managed Care – PPO | Admitting: Nurse Practitioner

## 2022-08-24 ENCOUNTER — Ambulatory Visit (HOSPITAL_BASED_OUTPATIENT_CLINIC_OR_DEPARTMENT_OTHER): Payer: BC Managed Care – PPO | Admitting: Nurse Practitioner

## 2022-08-29 ENCOUNTER — Ambulatory Visit (INDEPENDENT_AMBULATORY_CARE_PROVIDER_SITE_OTHER): Payer: BC Managed Care – PPO | Admitting: Nurse Practitioner

## 2022-08-29 ENCOUNTER — Encounter (HOSPITAL_BASED_OUTPATIENT_CLINIC_OR_DEPARTMENT_OTHER): Payer: Self-pay | Admitting: Nurse Practitioner

## 2022-08-29 VITALS — BP 145/69 | HR 89 | Ht 73.0 in | Wt 240.0 lb

## 2022-08-29 DIAGNOSIS — R10A1 Flank pain, right side: Secondary | ICD-10-CM | POA: Insufficient documentation

## 2022-08-29 DIAGNOSIS — R109 Unspecified abdominal pain: Secondary | ICD-10-CM | POA: Diagnosis not present

## 2022-08-29 DIAGNOSIS — G4733 Obstructive sleep apnea (adult) (pediatric): Secondary | ICD-10-CM | POA: Diagnosis not present

## 2022-08-29 DIAGNOSIS — I10 Essential (primary) hypertension: Secondary | ICD-10-CM | POA: Diagnosis not present

## 2022-08-29 DIAGNOSIS — Z122 Encounter for screening for malignant neoplasm of respiratory organs: Secondary | ICD-10-CM

## 2022-08-29 DIAGNOSIS — Z0000 Encounter for general adult medical examination without abnormal findings: Secondary | ICD-10-CM | POA: Diagnosis not present

## 2022-08-29 DIAGNOSIS — Z113 Encounter for screening for infections with a predominantly sexual mode of transmission: Secondary | ICD-10-CM

## 2022-08-29 DIAGNOSIS — I429 Cardiomyopathy, unspecified: Secondary | ICD-10-CM

## 2022-08-29 MED ORDER — TAMSULOSIN HCL 0.4 MG PO CAPS: 0.4000 mg | ORAL_CAPSULE | Freq: Every day | ORAL | 3 refills | Status: DC

## 2022-08-29 NOTE — Assessment & Plan Note (Signed)
Right flank and pelvic pain for approximately 1 week.  Patient did recently receive treatment for STI when partner was found to be positive.  We will repeat testing today to ensure that resolution of infection has occurred.

## 2022-08-29 NOTE — Assessment & Plan Note (Signed)
Chronic.  Last echo 03/2021 showing improved heart muscle function.  No alarm symptoms present at this time.  Recommend routine daily exercise with slow and steady increase with avoidance of overly strenuous activities at onset.  Labs today.

## 2022-08-29 NOTE — Assessment & Plan Note (Signed)
Chronic. BP elevated in office today, however, patient has not taken his medication this morning and is pain which is likely the driving force for the elevated blood pressure readings.  No alarm symptoms present at this time.  No changes to medications today.  Recommend at home blood pressure monitoring with goal blood pressures less than 130/85.  Patient encouraged to notify the office if blood pressure readings are consistently higher than this as we will likely need to change medications.  Labs today.

## 2022-08-29 NOTE — Patient Instructions (Addendum)
Your symptoms are very consistent with kidney stones. I have sent in a medication called Flomax that I want you to take every day to help the stone pass. This will also help with the pain.  I will let you know about the lab results.   I have sent in an x-ray that you will have done on the second floor to see if we can see the kidney stone. I have also sent in the lung screening order. They will call you to schedule this.

## 2022-08-29 NOTE — Assessment & Plan Note (Signed)
Right flank and lower right pelvic pain noted for approximately the last week.  Symptoms of varying intensity of pain coming in waves like patterns are consistent with kidney stone.  Discussed suspicions with patient.  We will plan for labs and abdominal x-ray today.  We will also repeat STI testing to ensure that this is not a causative factor for the symptoms.  At this time no alarm symptoms are present and examination fairly benign.  We will follow-up based on laboratory findings Flomax provided today with encouragement of increased hydration for suspicion of nephrolithiasis.

## 2022-08-29 NOTE — Progress Notes (Signed)
[name] Render, DNP, AGNP-c [facility] [address]  [place], [place] [address] [phone]  Subjective:   [name] Fields is a 61 y.o. male presents to day for evaluation of: Acute Visit (Patient presents today for back pain x 1 week. )  Flank Pain Right sided flank pain with radiation into right abdomen. Started about a week ago. He tells me that at times he will feel like he has to urinate, however, his stream will be weak. He tells me the pain seems to come in waves and at times will only be a mildly dull ache followed by intense pain. He has had nausea twice, but not bad. He has not had any fevers. He tells me that the pain can be intense to the point that he feels like he cannot move. He tells me that sometimes bending hurts or even lifting his leg hurts. He has been using a heating pad during the night and this helps. He denies blood in the urine, malodorous urine, new sexual partners.  He does endorse recent treatment for chlamydia after which he completed the entire course of one-time oral dose of medication and one-time injectable.  He has never had kidney stones in the past. Health Check [name] Fields is requesting labs and overall health evaluation including lungs he tells me that he would like to start hiking and is planning a camping trip in the near future and would like to make sure everything is stable before he goes on his trip.  PMH, Medications, and Allergies reviewed and updated in chart as appropriate.   ROS negative except for what is listed in HPI. Objective:  BP (!) 145/69   Pulse 89   Ht '6\' 1"'$  (1.854 m)   Wt 240 lb (108.9 kg)   SpO2 99%   BMI 31.66 kg/m  Physical Exam Vitals and nursing note reviewed.  Constitutional:      General: He is not in acute distress.    Appearance: Normal appearance.  HENT:     Head: Normocephalic.  Eyes:     Extraocular Movements: Extraocular movements intact.     Pupils: Pupils are  equal, round, and reactive to light.  Cardiovascular:     Rate and Rhythm: Normal rate and regular rhythm.     Pulses: Normal pulses.     Heart sounds: Normal heart sounds.  Pulmonary:     Effort: Pulmonary effort is normal.     Breath sounds: Normal breath sounds.  Abdominal:     General: Bowel sounds are normal. There is no distension.     Palpations: Abdomen is soft. There is no mass.     Tenderness: There is abdominal tenderness. There is right CVA tenderness. There is no left CVA tenderness, guarding or rebound.     Hernia: No hernia is present.  Musculoskeletal:        General: Normal range of motion.     Cervical back: Normal range of motion.     Right lower leg: No edema.     Left lower leg: No edema.  Skin:    General: Skin is warm and dry.     Capillary Refill: Capillary refill takes less than 2 seconds.  Neurological:     General: No focal deficit present.     Mental Status: He is alert and oriented to person, place, and time.  Psychiatric:        Mood and Affect: Mood normal.  Behavior: Behavior normal.        Thought Content: Thought content normal.        Judgment: Judgment normal.           Assessment & Plan:   Problem List Items Addressed This Visit     Hypertension    Chronic. BP elevated in office today, however, patient has not taken his medication this morning and is pain which is likely the driving force for the elevated blood pressure readings.  No alarm symptoms present at this time.  No changes to medications today.  Recommend at home blood pressure monitoring with goal blood pressures less than 130/85.  Patient encouraged to notify the office if blood pressure readings are consistently higher than this as we will likely need to change medications.  Labs today.      Relevant Orders   CBC with Differential/Platelet   Comprehensive metabolic panel   Hemoglobin A1c   VITAMIN D 25 Hydroxy (Vit-D Deficiency, Fractures)   Cardiomyopathy (HCC)     Chronic.  Last echo 03/2021 showing improved heart muscle function.  No alarm symptoms present at this time.  Recommend routine daily exercise with slow and steady increase with avoidance of overly strenuous activities at onset.  Labs today.      Relevant Orders   CBC with Differential/Platelet   Comprehensive metabolic panel   Hemoglobin A1c   VITAMIN D 25 Hydroxy (Vit-D Deficiency, Fractures)   OSA on CPAP    Chronic.  Reportedly sleeping well with CPAP.  No alarm symptoms present at this time.  Encouraged continuation of use and continuation of blood pressure monitoring.      Relevant Orders   CBC with Differential/Platelet   Comprehensive metabolic panel   Hemoglobin A1c   VITAMIN D 25 Hydroxy (Vit-D Deficiency, Fractures)   RESOLVED: Health maintenance examination   Relevant Orders   CBC with Differential/Platelet   Comprehensive metabolic panel   Hemoglobin A1c   VITAMIN D 25 Hydroxy (Vit-D Deficiency, Fractures)   Routine screening for STI (sexually transmitted infection)    Right flank and pelvic pain for approximately 1 week.  Patient did recently receive treatment for STI when partner was found to be positive.  We will repeat testing today to ensure that resolution of infection has occurred.      Relevant Orders   STI Profile, CT/NG/TV   Acute right flank pain - Primary    Right flank and lower right pelvic pain noted for approximately the last week.  Symptoms of varying intensity of pain coming in waves like patterns are consistent with kidney stone.  Discussed suspicions with patient.  We will plan for labs and abdominal x-ray today.  We will also repeat STI testing to ensure that this is not a causative factor for the symptoms.  At this time no alarm symptoms are present and examination fairly benign.  We will follow-up based on laboratory findings Flomax provided today with encouragement of increased hydration for suspicion of nephrolithiasis.      Relevant Medications    tamsulosin (FLOMAX) 0.4 MG CAPS capsule   Other Relevant Orders   DG Abd 1 View   UA/M w/rflx Culture, Comp   Other Visit Diagnoses     Screening for lung cancer       Relevant Orders   CT CHEST LUNG CANCER SCREENING LOW DOSE WO CONTRAST         [name] Render, DNP, AGNP-c 08/29/2022  5:45 PM    History, Medications,  Surgery, SDOH, and Family History reviewed and updated as appropriate.

## 2022-08-29 NOTE — Assessment & Plan Note (Signed)
Chronic.  Reportedly sleeping well with CPAP.  No alarm symptoms present at this time.  Encouraged continuation of use and continuation of blood pressure monitoring.

## 2022-08-30 LAB — UA/M W/RFLX CULTURE, COMP
Bilirubin, UA: NEGATIVE
Glucose, UA: NEGATIVE
Ketones, UA: NEGATIVE
Leukocytes,UA: NEGATIVE
Nitrite, UA: NEGATIVE
Specific Gravity, UA: 1.026 (ref 1.005–1.030)
Urobilinogen, Ur: 0.2 mg/dL (ref 0.2–1.0)
pH, UA: 5.5 (ref 5.0–7.5)

## 2022-08-30 LAB — MICROSCOPIC EXAMINATION
Bacteria, UA: NONE SEEN
Casts: NONE SEEN /lpf
Epithelial Cells (non renal): NONE SEEN /hpf (ref 0–10)
RBC, Urine: NONE SEEN /hpf (ref 0–2)
WBC, UA: NONE SEEN /hpf (ref 0–5)

## 2022-08-31 LAB — COMPREHENSIVE METABOLIC PANEL
ALT: 17 IU/L (ref 0–44)
AST: 17 IU/L (ref 0–40)
Albumin/Globulin Ratio: 1.4 (ref 1.2–2.2)
Albumin: 3.7 g/dL — ABNORMAL LOW (ref 3.8–4.9)
Alkaline Phosphatase: 101 IU/L (ref 44–121)
BUN/Creatinine Ratio: 15 (ref 10–24)
BUN: 13 mg/dL (ref 8–27)
Bilirubin Total: 0.2 mg/dL (ref 0.0–1.2)
CO2: 19 mmol/L — ABNORMAL LOW (ref 20–29)
Calcium: 9.3 mg/dL (ref 8.6–10.2)
Chloride: 107 mmol/L — ABNORMAL HIGH (ref 96–106)
Creatinine, Ser: 0.89 mg/dL (ref 0.76–1.27)
Globulin, Total: 2.7 g/dL (ref 1.5–4.5)
Glucose: 97 mg/dL (ref 70–99)
Potassium: 4.8 mmol/L (ref 3.5–5.2)
Sodium: 140 mmol/L (ref 134–144)
Total Protein: 6.4 g/dL (ref 6.0–8.5)
eGFR: 98 mL/min/{1.73_m2} (ref >59)

## 2022-08-31 LAB — CBC WITH DIFFERENTIAL/PLATELET
Basophils Absolute: 0 10*3/uL (ref 0.0–0.2)
Basos: 0 %
EOS (ABSOLUTE): 0.1 10*3/uL (ref 0.0–0.4)
Eos: 1 %
Hematocrit: 42.3 % (ref 37.5–51.0)
Hemoglobin: 14.2 g/dL (ref 13.0–17.7)
Immature Grans (Abs): 0 10*3/uL (ref 0.0–0.1)
Immature Granulocytes: 0 %
Lymphocytes Absolute: 3.1 10*3/uL (ref 0.7–3.1)
Lymphs: 42 %
MCH: 29.5 pg (ref 26.6–33.0)
MCHC: 33.6 g/dL (ref 31.5–35.7)
MCV: 88 fL (ref 79–97)
Monocytes Absolute: 0.6 10*3/uL (ref 0.1–0.9)
Monocytes: 9 %
Neutrophils Absolute: 3.5 10*3/uL (ref 1.4–7.0)
Neutrophils: 48 %
Platelets: 275 10*3/uL (ref 150–450)
RBC: 4.82 x10E6/uL (ref 4.14–5.80)
RDW: 13.2 % (ref 11.6–15.4)
WBC: 7.3 10*3/uL (ref 3.4–10.8)

## 2022-08-31 LAB — STI PROFILE, CT/NG/TV
Chlamydia by NAA: NEGATIVE
Gonococcus by NAA: NEGATIVE
HCV Ab: NONREACTIVE
HIV Screen 4th Generation wRfx: NONREACTIVE
Hep B Core Total Ab: POSITIVE — AB
Hep B Surface Ab, Qual: REACTIVE
Hepatitis B Surface Ag: NEGATIVE
RPR Ser Ql: NONREACTIVE
Trich vag by NAA: NEGATIVE

## 2022-08-31 LAB — HCV INTERPRETATION

## 2022-08-31 LAB — HEMOGLOBIN A1C
Est. average glucose Bld gHb Est-mCnc: 126 mg/dL
Hgb A1c MFr Bld: 6 % — ABNORMAL HIGH (ref 4.8–5.6)

## 2022-08-31 LAB — VITAMIN D 25 HYDROXY (VIT D DEFICIENCY, FRACTURES): Vit D, 25-Hydroxy: 18.2 ng/mL — ABNORMAL LOW (ref 30.0–100.0)

## 2022-09-05 ENCOUNTER — Other Ambulatory Visit: Payer: BC Managed Care – PPO

## 2022-09-05 ENCOUNTER — Other Ambulatory Visit (HOSPITAL_BASED_OUTPATIENT_CLINIC_OR_DEPARTMENT_OTHER): Payer: Self-pay | Admitting: Nurse Practitioner

## 2022-09-05 ENCOUNTER — Telehealth (HOSPITAL_BASED_OUTPATIENT_CLINIC_OR_DEPARTMENT_OTHER): Payer: Self-pay | Admitting: Nurse Practitioner

## 2022-09-05 DIAGNOSIS — E559 Vitamin D deficiency, unspecified: Secondary | ICD-10-CM

## 2022-09-05 MED ORDER — VITAMIN D (ERGOCALCIFEROL) 1.25 MG (50000 UNIT) PO CAPS: 50000.0000 [IU] | ORAL_CAPSULE | ORAL | 3 refills | Status: DC

## 2022-09-05 NOTE — Telephone Encounter (Signed)
Pt is calling states that the CT was cancelled --please call to get the auth if needed  Sent msg in referral also

## 2022-09-12 ENCOUNTER — Other Ambulatory Visit: Payer: Self-pay | Admitting: Internal Medicine

## 2022-09-14 ENCOUNTER — Ambulatory Visit (HOSPITAL_BASED_OUTPATIENT_CLINIC_OR_DEPARTMENT_OTHER): Payer: BC Managed Care – PPO | Admitting: Nurse Practitioner

## 2022-09-14 ENCOUNTER — Encounter (HOSPITAL_BASED_OUTPATIENT_CLINIC_OR_DEPARTMENT_OTHER): Payer: Self-pay | Admitting: Nurse Practitioner

## 2022-09-14 ENCOUNTER — Ambulatory Visit (INDEPENDENT_AMBULATORY_CARE_PROVIDER_SITE_OTHER): Payer: BC Managed Care – PPO | Admitting: Orthopaedic Surgery

## 2022-09-14 ENCOUNTER — Ambulatory Visit (HOSPITAL_BASED_OUTPATIENT_CLINIC_OR_DEPARTMENT_OTHER): Payer: BC Managed Care – PPO

## 2022-09-14 ENCOUNTER — Other Ambulatory Visit (HOSPITAL_BASED_OUTPATIENT_CLINIC_OR_DEPARTMENT_OTHER): Payer: Self-pay

## 2022-09-14 VITALS — BP 131/87 | HR 76 | Temp 97.8°F | Ht 73.0 in | Wt 235.0 lb

## 2022-09-14 DIAGNOSIS — D171 Benign lipomatous neoplasm of skin and subcutaneous tissue of trunk: Secondary | ICD-10-CM

## 2022-09-14 DIAGNOSIS — D239 Other benign neoplasm of skin, unspecified: Secondary | ICD-10-CM | POA: Diagnosis not present

## 2022-09-14 DIAGNOSIS — M2391 Unspecified internal derangement of right knee: Secondary | ICD-10-CM

## 2022-09-14 DIAGNOSIS — Z9109 Other allergy status, other than to drugs and biological substances: Secondary | ICD-10-CM

## 2022-09-14 MED ORDER — FLUTICASONE PROPIONATE 50 MCG/ACT NA SUSP: 2.0000 | Freq: Every day | NASAL | 6 refills | Status: DC

## 2022-09-14 MED ORDER — MELOXICAM 15 MG PO TABS
15.0000 mg | ORAL_TABLET | Freq: Every day | ORAL | 0 refills | Status: DC
  Filled 2022-09-14: qty 14, 14d supply, fill #0

## 2022-09-14 NOTE — Patient Instructions (Addendum)
Keep drinking lots of water to keep flushing the kidneys. If you start having more pain let me know.   Keep up the good work!! I am proud of you.   I sent the flonase in to the [facility] for you.

## 2022-09-14 NOTE — Progress Notes (Signed)
Chief Complaint: Right knee pain     History of Present Illness:   09/14/2022: Presents today for follow-up of his right knee.  He states that he did not get any relief from his knee injection.  He continues to have mechanical type symptoms and feeling like the knee is giving out.  He is unable to get an MRI due to his pacemaker.  He is now having difficulty walking even shorter distances.  [name] Fields is a 61 y.o. male presents today after a fall on July 25 when he fell down the steps.  Since that time he has had pain and swelling about the medial joint space.  He has had a limp and feels like the knee is giving out on the side.  He was seeing Dr. [place] performed an an x-ray and a CT scan but he is unable to get an MRI as he has a pacemaker that is not compatible.  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    Surgical History:   None  PMH/PSH/Family History/Social History/Meds/Allergies:    Past Medical History:  Diagnosis Date   Allergy to ACE inhibitors    Atrial tachycardia    Cardiomyopathy, nonischemic (HCC)    GERD (gastroesophageal reflux disease)    Hiatal hernia    High-grade second-degree heart block     Qualifier: Diagnosis of  By: Caryl Comes, MD, Remus Blake    Hypertension    Permanent pacemaker-[employer] [place]; gen re in the summer he has o pain syndrome BP 135% at time long so is his we have been short of his device is in a in a in a Foley disease since the stents removed about 3.0-4 seconds on the episode the patient is very 28 and a [place] when his pacemaker but when he and his placement 2011   Presence of permanent cardiac pacemaker    Sleep apnea    uses CPAP sometimes   WPW (Wolff-Parkinson-White syndrome)    s/pRFCA   Past Surgical History:  Procedure Laterality Date   CARDIAC DEFIBRILLATOR PLACEMENT  2011   St Jude -- [employer] RF   CARPAL TUNNEL RELEASE Left 08/18/2020    Procedure: CARPAL TUNNEL RELEASE;  Surgeon: Daryll Brod, MD;  Location: [facility];  Service: Orthopedics;  Laterality: Left;  IV REGIONAL FOREARM BLOCK   CARPAL TUNNEL RELEASE Right 12/01/2020   Procedure: CARPAL TUNNEL RELEASE;  Surgeon: Daryll Brod, MD;  Location: [facility];  Service: Orthopedics;  Laterality: Right;  IV REGIONAL FOREARM BLOCK   PACEMAKER INSERTION  1999   explanted 2011   PPM GENERATOR CHANGEOUT N/A 07/07/2021   Procedure: PPM GENERATOR CHANGEOUT;  Surgeon: [name], MD;  Location: [facility];  Service: Cardiovascular;  Laterality: N/A;   Social History   Socioeconomic History   Marital status: Legally Separated    Spouse name: Not on file   Number of children: Not on file   Years of education: Not on file   Highest education level: Not on file  Occupational History   Occupation: truck driver    Employer: [employer]  Tobacco Use   Smoking status: Every Day    Packs/day: 0.50  Years: 43.00    Total pack years: 21.50    Types: Cigarettes    Start date: 1978   Smokeless tobacco: Never   Tobacco comments:    Pt has stopped and satated back  Vaping Use   Vaping Use: Never used  Substance and Sexual Activity   Alcohol use: Yes    Alcohol/week: 7.0 standard drinks of alcohol    Types: 7 Standard drinks or equivalent per week    Comment: social   Drug use: No   Sexual activity: Yes  Other Topics Concern   Not on file  Social History Narrative   Not on file   Social Determinants of Health   Financial Resource Strain: Not on file  Food Insecurity: Not on file  Transportation Needs: Not on file  Physical Activity: Not on file  Stress: Not on file  Social Connections: Not on file   Family History  Problem Relation Age of Onset   Breast cancer Mother    Heart disease Mother    Heart Problems Mother        pacemaker   Prostate cancer Father    Hypertension Father    Allergies  Allergen Reactions    Lisinopril Swelling    Lip swelling   Current Outpatient Medications  Medication Sig Dispense Refill   meloxicam (MOBIC) 15 MG tablet Take 1 tablet (15 mg total) by mouth daily. 14 tablet 0   Ascorbic Acid (VITAMIN C) 1000 MG tablet Take 1,000 mg by mouth daily.     fluticasone (FLONASE) 50 MCG/ACT nasal spray Place 2 sprays into both nostrils daily. 16 g 6   hydrochlorothiazide (HYDRODIURIL) 25 MG tablet TAKE ONE TABLET BY MOUTH ONE TIME DAILY 90 tablet 2   tamsulosin (FLOMAX) 0.4 MG CAPS capsule Take 1 capsule (0.4 mg total) by mouth daily. 30 capsule 3   Vitamin D, Ergocalciferol, (DRISDOL) 1.25 MG (50000 UNIT) CAPS capsule Take 1 capsule (50,000 Units total) by mouth every 7 (seven) days. 12 capsule 3   No current facility-administered medications for this visit.   No results found.  Review of Systems:   A ROS was performed including pertinent positives and negatives as documented in the HPI.  Physical Exam :   Constitutional: NAD and appears stated age Neurological: Alert and oriented Psych: Appropriate affect and cooperative There were no vitals taken for this visit.   Comprehensive Musculoskeletal Exam:      Musculoskeletal Exam  Gait Normal  Alignment Normal   Right Left  Inspection Normal Normal  Palpation    Tenderness Medial joint line None  Crepitus None None  Effusion Positive Negative  Range of Motion    Extension -3 -3  Flexion 130 130  Strength    Extension 5/5 5/5  Flexion 5/5 5/5  Ligament Exam     Generalized Laxity No No  Lachman Negative Negative   Pivot Shift Negative Negative  Anterior Drawer Negative Negative  Valgus at 0 Negative Negative  Valgus at 20 Negative Negative  Varus at 0 0 0  Varus at 20   0 0  Posterior Drawer at 90 0 0  Vascular/Lymphatic Exam    Edema None None  Venous Stasis Changes No No  Distal Circulation Normal Normal  Neurologic    Light Touch Sensation Intact Intact  Special Tests: Medial joint with positive  McMurray tenderness    Imaging:   Xray (right knee 4 views): Normal  CT (right knee): There is a large knee effusion with a  suprapatellar loose body  I personally reviewed and interpreted the radiographs.   Assessment:   61 y.o. male with right knee pain and medial joint space consistent with possible meniscal injury as well as loose body in the suprapatellar pouch.  At this time he is unable to get an MRI due to a pacemaker.  Side effect I did discuss treatment options with him.  We did discuss that ultimately given the fact that he has not improved with an injection that he may ultimately benefit from diagnostic arthroscopy for removal of this loose body as well as assessment of his meniscus.  I did describe that this could result in a period of 2 weeks of nonweightbearing postoperatively for meniscus repair is required.  At this time he would like to consider his options and he will plan to send [place] a message after he is further considered the timing. Plan :    -Plan for possible diagnostic right knee arthroscopy with removal of loose body     I personally saw and evaluated the patient, and participated in the management and treatment plan.  Vanetta Mulders, MD Attending Physician, Orthopedic Surgery  This document was dictated using Dragon voice recognition software. A reasonable attempt at proof reading has been made to minimize errors.

## 2022-09-14 NOTE — Progress Notes (Signed)
[name] Render, DNP, AGNP-c [facility] [address]  [place], [place] [address] [phone]  Subjective:   [name] Fields is a 61 y.o. male presents to day for evaluation of: Follow-up (Has lump on chest)  There are no diagnoses linked to this encounter.  Lump Elizabeth endorses noticing a lump at the distal end of the sternum. He tells me this has been present in the past but seemed to go away, but recently he noticed it again. He has recently been working out and has lost about 25lbs. Since the weight loss he feels it has come back. He denies soreness, pain, numbness, skin changes, or drainage.  Pore of Whiner Tirrell has a chronic pore of whiner on the upper back near the center that is filling again. No pain.  PMH, Medications, and Allergies reviewed and updated in chart as appropriate.   ROS negative except for what is listed in HPI. Objective:  BP 131/87 (BP Location: Right Arm, Patient Position: Sitting)   Pulse 76   Temp 97.8 F (36.6 C)   Ht '6\' 1"'$  (1.854 m)   Wt 235 lb (106.6 kg)   SpO2 100%   BMI 31.00 kg/m  Physical Exam Vitals and nursing note reviewed.  Constitutional:      Appearance: Normal appearance.  HENT:     Head: Normocephalic.  Eyes:     Pupils: Pupils are equal, round, and reactive to light.  Cardiovascular:     Rate and Rhythm: Normal rate and regular rhythm.     Pulses: Normal pulses.     Heart sounds: Normal heart sounds.  Pulmonary:     Effort: Pulmonary effort is normal.     Breath sounds: Normal breath sounds.  Abdominal:     General: Abdomen is flat. Bowel sounds are normal. There is no distension.     Palpations: Abdomen is soft.     Tenderness: There is no abdominal tenderness. There is no guarding.  Musculoskeletal:     Cervical back: Normal range of motion.  Skin:    General: Skin is warm and dry.     Capillary Refill: Capillary refill takes less than 2 seconds.       Neurological:      General: No focal deficit present.     Mental Status: He is alert and oriented to person, place, and time.  Psychiatric:        Mood and Affect: Mood normal.           Assessment & Plan:   Problem List Items Addressed This Visit     Dilated pore of Winer    Presentation consistent with previous dilated pore of winer at same location. No erythema or signs of infection at this time. Will plan to monitor.       Lipoma of torso - Primary    Soft, mobile mass at dorsal end of sternum present for many years with no change. Consistent with lipoma. Most likely he has noticed this again as he has recently lost weight and it is not visible again. At this time it is not causing any pain or symptoms. Joint decision to monitor area at this time. He will f/u if new or worsening symptoms present.       Other Visit Diagnoses     Environmental allergies       Relevant Medications   fluticasone (FLONASE) 50 MCG/ACT nasal spray  [name] Render, DNP, AGNP-c 10/29/2022  10:41 PM    History, Medications, Surgery, SDOH, and Family History reviewed and updated as appropriate.

## 2022-09-21 ENCOUNTER — Ambulatory Visit (HOSPITAL_BASED_OUTPATIENT_CLINIC_OR_DEPARTMENT_OTHER): Payer: BC Managed Care – PPO | Admitting: Nurse Practitioner

## 2022-10-07 ENCOUNTER — Ambulatory Visit (INDEPENDENT_AMBULATORY_CARE_PROVIDER_SITE_OTHER): Payer: BC Managed Care – PPO

## 2022-10-07 DIAGNOSIS — I429 Cardiomyopathy, unspecified: Secondary | ICD-10-CM

## 2022-10-07 LAB — CUP PACEART REMOTE DEVICE CHECK
Battery Remaining Longevity: 103 mo
Battery Remaining Percentage: 92 %
Battery Voltage: 3.02 V
Brady Statistic AP VP Percent: 3.5 %
Brady Statistic AP VS Percent: 2 %
Brady Statistic AS VP Percent: 11 %
Brady Statistic AS VS Percent: 84 %
Brady Statistic RA Percent Paced: 5.3 %
Brady Statistic RV Percent Paced: 14 %
Date Time Interrogation Session: 20231208142843
Implantable Lead Connection Status: 753985
Implantable Lead Connection Status: 753985
Implantable Lead Implant Date: 19990719
Implantable Lead Implant Date: 19990719
Implantable Lead Location: 753859
Implantable Lead Location: 753860
Implantable Pulse Generator Implant Date: 20220907
Lead Channel Impedance Value: 360 Ohm
Lead Channel Impedance Value: 380 Ohm
Lead Channel Pacing Threshold Amplitude: 0.5 V
Lead Channel Pacing Threshold Amplitude: 1 V
Lead Channel Pacing Threshold Pulse Width: 0.5 ms
Lead Channel Pacing Threshold Pulse Width: 0.5 ms
Lead Channel Sensing Intrinsic Amplitude: 4.2 mV
Lead Channel Sensing Intrinsic Amplitude: 8.5 mV
Lead Channel Setting Pacing Amplitude: 2 V
Lead Channel Setting Pacing Amplitude: 2.5 V
Lead Channel Setting Pacing Pulse Width: 0.5 ms
Lead Channel Setting Sensing Sensitivity: 2 mV
Pulse Gen Model: 2272
Pulse Gen Serial Number: 6519619

## 2022-10-14 ENCOUNTER — Other Ambulatory Visit: Payer: Self-pay

## 2022-10-14 ENCOUNTER — Telehealth: Payer: Self-pay | Admitting: Internal Medicine

## 2022-10-14 MED ORDER — HYDROCHLOROTHIAZIDE 25 MG PO TABS: 25.0000 mg | ORAL_TABLET | Freq: Every day | ORAL | 0 refills | Status: DC

## 2022-10-14 NOTE — Telephone Encounter (Signed)
The patient was notified his refill of hydroclorothiazide was sent to  [facility] in [place], [place] [address] at his request.

## 2022-10-14 NOTE — Telephone Encounter (Signed)
*  STAT* If patient is at the pharmacy, call can be transferred to refill team.   1. Which medications need to be refilled? (please list name of each medication and dose if known) hydrochlorothiazide (HYDRODIURIL) 25 MG tablet   2. Which pharmacy/location (including street and city if local pharmacy) is medication to be sent to? [facility] in [place], [place] [address]  3. Do they need a 30 day or 90 day supply? 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

## 2022-10-26 ENCOUNTER — Telehealth: Payer: Self-pay

## 2022-10-26 NOTE — Telephone Encounter (Signed)
Alert received from [employer]: Pacemaker alert received for High Ventricuar Rate episodes.  2 HVR and 1 AMS episode recorded on 10/24/22 between 2:27 PM and 4:18 PM. Longest duration 3 minutes 44 seconds.  EGMs show 1:1 atrial driven tachycardia. Average V rates 183-186 bpm. Sent to [facility] for review.  Pt with history of atrial tachycardia.  Previously suggested beta blocker but Pt declined.  Please advise.  '

## 2022-10-26 NOTE — Telephone Encounter (Signed)
Agree will follow

## 2022-10-28 NOTE — Progress Notes (Signed)
Remote pacemaker transmission.   

## 2022-10-29 DIAGNOSIS — D171 Benign lipomatous neoplasm of skin and subcutaneous tissue of trunk: Secondary | ICD-10-CM | POA: Insufficient documentation

## 2022-10-29 NOTE — Assessment & Plan Note (Signed)
Soft, mobile mass at dorsal end of sternum present for many years with no change. Consistent with lipoma. Most likely he has noticed this again as he has recently lost weight and it is not visible again. At this time it is not causing any pain or symptoms. Joint decision to monitor area at this time. He will f/u if new or worsening symptoms present.

## 2022-10-29 NOTE — Assessment & Plan Note (Signed)
Presentation consistent with previous dilated pore of winer at same location. No erythema or signs of infection at this time. Will plan to monitor.

## 2022-11-14 ENCOUNTER — Telehealth: Payer: Self-pay

## 2022-11-14 NOTE — Telephone Encounter (Signed)
Alert received from [employer]:  Device alert for HVR, 1/3 @ 18:36, EGM shows NSVT, 9sec in duration HR 185 - route to triage per protocol There is s second HVR 1/14 @ 01:57 showing regullar 1:1, duration 41sec, HR 179  Known SVT/Atrial tach.  NSVT 9 seconds-33 beats.  Per Pt he does not recall NSVT on 11/02/22.  He states that sometimes he can feel his heart racing.  Advised would send to Dr. Caryl Comes for advisement.  Briefly discussed starting a beta blocker as suggested by AT at last office visit.  Advised would return call to Pt if Dr. Caryl Comes  suggests starting any medications.  Pt in agreement.

## 2022-11-17 NOTE — Telephone Encounter (Signed)
[name] Fields  I agree with trying a beta blocker  if he is game bisoprolol 2,5 daily Thanks SK

## 2022-11-18 MED ORDER — BISOPROLOL FUMARATE 5 MG PO TABS: 2.5000 mg | ORAL_TABLET | Freq: Every day | ORAL | 3 refills | Status: DC

## 2022-11-18 NOTE — Telephone Encounter (Signed)
Outreach made to Pt.  He is agreeable to starting bisoprolol 2.5 mg by mouth daily.  Sent to [facility] per Pt request.

## 2022-12-29 ENCOUNTER — Ambulatory Visit (INDEPENDENT_AMBULATORY_CARE_PROVIDER_SITE_OTHER): Payer: No Typology Code available for payment source | Admitting: Nurse Practitioner

## 2022-12-29 ENCOUNTER — Encounter: Payer: Self-pay | Admitting: Nurse Practitioner

## 2022-12-29 VITALS — BP 140/70 | HR 70 | Wt 244.0 lb

## 2022-12-29 DIAGNOSIS — Z122 Encounter for screening for malignant neoplasm of respiratory organs: Secondary | ICD-10-CM | POA: Diagnosis not present

## 2022-12-29 DIAGNOSIS — W57XXXA Bitten or stung by nonvenomous insect and other nonvenomous arthropods, initial encounter: Secondary | ICD-10-CM | POA: Diagnosis not present

## 2022-12-29 DIAGNOSIS — I10 Essential (primary) hypertension: Secondary | ICD-10-CM | POA: Diagnosis not present

## 2022-12-29 MED ORDER — TRIAMCINOLONE ACETONIDE 0.1 % EX CREA: 1.0000 | TOPICAL_CREAM | Freq: Two times a day (BID) | CUTANEOUS | 1 refills | Status: DC

## 2022-12-29 MED ORDER — DOXYCYCLINE HYCLATE 100 MG PO TABS: 100.0000 mg | ORAL_TABLET | Freq: Two times a day (BID) | ORAL | 0 refills | Status: DC

## 2022-12-29 NOTE — Assessment & Plan Note (Signed)
Everyday smoker for >30 years. Recommend lung ca screening for routine health maintenance.

## 2022-12-29 NOTE — Progress Notes (Signed)
[name] Render, DNP, AGNP-c [place], [place] [address] [phone]  Subjective:   [name] Fields is a 62 y.o. male presents to day for evaluation of: Blisters on leg.  Randee presents today with a chief complaint of blisters on both legs between his knees and on the left thigh. He noticed the blisters upon waking up on Saturday morning during his stay at an air B&B in [facility] Initially, the blisters were larger but have decreased in size. Ivon experienced itching only once at the onset and describes the blisters as localized to the inside of both legs without associated pain. He denies any recent illness or introduction of new medications. He did spend time on the beach and had contact with beach water. Since the appearance of the blisters, Johnston has taken precautions by washing all his belongings. He did not use any bug spray during his hotel stay.   He also reports an elevated blood pressure reading of 140/70 and acknowledges missing his blood pressure medication on the morning of the examination.  He would like to have lung cancer screening that we have discussed in the past. His insurance is new and he will need a new order for this.    PMH, Medications, and Allergies reviewed and updated in chart as appropriate.   ROS negative except for what is listed in HPI. Objective:  BP (!) 140/70   Pulse 70   Wt 244 lb (110.7 kg)   BMI 32.19 kg/m  Physical Exam Vitals and nursing note reviewed.  Constitutional:      Appearance: Normal appearance.  HENT:     Head: Normocephalic.  Eyes:     Pupils: Pupils are equal, round, and reactive to light.  Cardiovascular:     Rate and Rhythm: Normal rate and regular rhythm.     Pulses: Normal pulses.     Heart sounds: Normal heart sounds.  Pulmonary:     Effort: Pulmonary effort is normal.     Breath sounds: Normal breath sounds.  Musculoskeletal:        General: Normal range of motion.      Cervical back: Normal range of motion.  Skin:    General: Skin is warm and dry.     Capillary Refill: Capillary refill takes less than 2 seconds.     Findings: Lesion and rash present. Rash is vesicular.          Comments: Scattered vesicular lesions less than 1cm across the medial aspects of the thighs, more on the left than the right.   Neurological:     General: No focal deficit present.     Mental Status: He is alert.  Psychiatric:        Mood and Affect: Mood normal.           Assessment & Plan:   Problem List Items Addressed This Visit     Hypertension    Blood pressure is elevated today. Patient typically is on HCTZ '25mg'$  and bisoprolol '5mg'$  for management. He has not taken his blood pressure medication this morning.  Plan: - repeat evaluation with continued elevation - recommend medication first thing in the morning - monitor BP at home and report if readings are higher than 130/80      Screening for lung cancer    Everyday smoker for >30 years. Recommend lung ca screening for routine health maintenance.       Relevant Orders   Ambulatory Referral for Lung Cancer  Scre   Arthropod bite - Primary    The patient presents with bilateral lesions on the inner aspects of the knees and thighs that are consistent with insect bites. I suspect these are bedbug bites given the linear appearance, but unable to rule out possible sand flea bites. Plan: - Prescribe doxycycline to prevent secondary infection and triamcinolone to help dry up the areas and prevent pruritus.  - Advise the patient to wash all clothing and bedding in hot water. - Watch for signs of infection, inflammation, or worsening symptoms and report immediately.      Relevant Medications   triamcinolone cream (KENALOG) 0.1 %   doxycycline (VIBRA-TABS) 100 MG tablet      [name] Render, DNP, AGNP-c 12/29/2022  7:48 PM   Time: 48 minutes, >50% spent counseling, care coordination, chart review, and  documentation.   History, Medications, Surgery, SDOH, and Family History reviewed and updated as appropriate.

## 2022-12-29 NOTE — Assessment & Plan Note (Signed)
Blood pressure is elevated today. Patient typically is on HCTZ '25mg'$  and bisoprolol '5mg'$  for management. He has not taken his blood pressure medication this morning.  Plan: - repeat evaluation with continued elevation - recommend medication first thing in the morning - monitor BP at home and report if readings are higher than 130/80

## 2022-12-29 NOTE — Assessment & Plan Note (Signed)
The patient presents with bilateral lesions on the inner aspects of the knees and thighs that are consistent with insect bites. I suspect these are bedbug bites given the linear appearance, but unable to rule out possible sand flea bites. Plan: - Prescribe doxycycline to prevent secondary infection and triamcinolone to help dry up the areas and prevent pruritus.  - Advise the patient to wash all clothing and bedding in hot water. - Watch for signs of infection, inflammation, or worsening symptoms and report immediately.

## 2022-12-29 NOTE — Patient Instructions (Signed)
Keep the areas clean and dry. Use the cream twice a day to help this dry up.

## 2023-01-06 ENCOUNTER — Ambulatory Visit (INDEPENDENT_AMBULATORY_CARE_PROVIDER_SITE_OTHER): Payer: BC Managed Care – PPO

## 2023-01-06 DIAGNOSIS — I441 Atrioventricular block, second degree: Secondary | ICD-10-CM

## 2023-01-09 LAB — CUP PACEART REMOTE DEVICE CHECK
Battery Remaining Longevity: 102 mo
Battery Remaining Percentage: 90 %
Battery Voltage: 3.02 V
Brady Statistic AP VP Percent: 3.2 %
Brady Statistic AP VS Percent: 2.5 %
Brady Statistic AS VP Percent: 11 %
Brady Statistic AS VS Percent: 84 %
Brady Statistic RA Percent Paced: 5.5 %
Brady Statistic RV Percent Paced: 14 %
Date Time Interrogation Session: 20240310181214
Implantable Lead Connection Status: 753985
Implantable Lead Connection Status: 753985
Implantable Lead Implant Date: 19990719
Implantable Lead Implant Date: 19990719
Implantable Lead Location: 753859
Implantable Lead Location: 753860
Implantable Pulse Generator Implant Date: 20220907
Lead Channel Impedance Value: 380 Ohm
Lead Channel Impedance Value: 400 Ohm
Lead Channel Pacing Threshold Amplitude: 0.5 V
Lead Channel Pacing Threshold Amplitude: 1 V
Lead Channel Pacing Threshold Pulse Width: 0.5 ms
Lead Channel Pacing Threshold Pulse Width: 0.5 ms
Lead Channel Sensing Intrinsic Amplitude: 4.3 mV
Lead Channel Sensing Intrinsic Amplitude: 9.7 mV
Lead Channel Setting Pacing Amplitude: 2 V
Lead Channel Setting Pacing Amplitude: 2.5 V
Lead Channel Setting Pacing Pulse Width: 0.5 ms
Lead Channel Setting Sensing Sensitivity: 2 mV
Pulse Gen Model: 2272
Pulse Gen Serial Number: 6519619

## 2023-01-30 ENCOUNTER — Telehealth: Payer: Self-pay

## 2023-01-30 NOTE — Telephone Encounter (Signed)
Patient reports he has been under a lot of stress including during the time of event. Reports during the event he felt shortness of breath and dizzy. Missed Friday & Saturday medication. Discussed important of compliance with medications. Patient voiced understanding. Sending to Dr. Caryl Comes.

## 2023-01-30 NOTE — Telephone Encounter (Signed)
Pt returning nurse call. He states he was stressed.

## 2023-01-30 NOTE — Telephone Encounter (Signed)
Following alert received from [employer] received for Device alert for HVR Event occurred 3/29 @ 05:31, EGM shows likely AF with RVR, duration 15sec, mean HR 186, 4 additional brief AMS. No hx of AF noted, no OAC per EPIC.  Attempted to call pt to assess. No answer, LMTCB.

## 2023-02-01 NOTE — Telephone Encounter (Signed)
I spoke with the patient and he agreed to send one first thing in the morning.

## 2023-02-01 NOTE — Telephone Encounter (Signed)
Very little afib on prior interrogation 3/24 >> can we have him retransmit to see if AFib episode is terminated  Thanks SK

## 2023-02-02 NOTE — Telephone Encounter (Signed)
Appears patient was back in normal rhythm on presenting.

## 2023-02-03 NOTE — Progress Notes (Signed)
Remote pacemaker transmission.   

## 2023-02-06 ENCOUNTER — Ambulatory Visit: Payer: No Typology Code available for payment source | Admitting: Nurse Practitioner

## 2023-02-07 ENCOUNTER — Ambulatory Visit: Payer: No Typology Code available for payment source | Admitting: Nurse Practitioner

## 2023-02-08 ENCOUNTER — Encounter: Payer: Self-pay | Admitting: Nurse Practitioner

## 2023-03-30 ENCOUNTER — Other Ambulatory Visit: Payer: Self-pay

## 2023-03-30 MED ORDER — HYDROCHLOROTHIAZIDE 25 MG PO TABS: 25.0000 mg | ORAL_TABLET | Freq: Every day | ORAL | 0 refills | Status: DC

## 2023-03-30 NOTE — Telephone Encounter (Signed)
Pt's medication was sent to pt's pharmacy as requested. Confirmation received.  °

## 2023-04-07 ENCOUNTER — Ambulatory Visit (INDEPENDENT_AMBULATORY_CARE_PROVIDER_SITE_OTHER): Payer: BC Managed Care – PPO

## 2023-04-07 DIAGNOSIS — I441 Atrioventricular block, second degree: Secondary | ICD-10-CM

## 2023-04-07 LAB — CUP PACEART REMOTE DEVICE CHECK
Battery Remaining Longevity: 98 mo
Battery Remaining Percentage: 87 %
Battery Voltage: 3.02 V
Brady Statistic AP VP Percent: 3.4 %
Brady Statistic AP VS Percent: 3.2 %
Brady Statistic AS VP Percent: 11 %
Brady Statistic AS VS Percent: 82 %
Brady Statistic RA Percent Paced: 6.3 %
Brady Statistic RV Percent Paced: 14 %
Date Time Interrogation Session: 20240607085423
Implantable Lead Connection Status: 753985
Implantable Lead Connection Status: 753985
Implantable Lead Implant Date: 19990719
Implantable Lead Implant Date: 19990719
Implantable Lead Location: 753859
Implantable Lead Location: 753860
Implantable Pulse Generator Implant Date: 20220907
Lead Channel Impedance Value: 360 Ohm
Lead Channel Impedance Value: 360 Ohm
Lead Channel Pacing Threshold Amplitude: 0.5 V
Lead Channel Pacing Threshold Amplitude: 1 V
Lead Channel Pacing Threshold Pulse Width: 0.5 ms
Lead Channel Pacing Threshold Pulse Width: 0.5 ms
Lead Channel Sensing Intrinsic Amplitude: 4 mV
Lead Channel Sensing Intrinsic Amplitude: 8.5 mV
Lead Channel Setting Pacing Amplitude: 2 V
Lead Channel Setting Pacing Amplitude: 2.5 V
Lead Channel Setting Pacing Pulse Width: 0.5 ms
Lead Channel Setting Sensing Sensitivity: 2 mV
Pulse Gen Model: 2272
Pulse Gen Serial Number: 6519619

## 2023-04-24 NOTE — Progress Notes (Signed)
Remote pacemaker transmission.   

## 2023-05-10 ENCOUNTER — Encounter: Payer: Self-pay | Admitting: Emergency Medicine

## 2023-07-07 ENCOUNTER — Ambulatory Visit (INDEPENDENT_AMBULATORY_CARE_PROVIDER_SITE_OTHER): Payer: BC Managed Care – PPO

## 2023-07-07 DIAGNOSIS — I441 Atrioventricular block, second degree: Secondary | ICD-10-CM

## 2023-07-10 ENCOUNTER — Other Ambulatory Visit: Payer: Self-pay | Admitting: Internal Medicine

## 2023-07-10 LAB — CUP PACEART REMOTE DEVICE CHECK
Battery Remaining Longevity: 96 mo
Battery Remaining Percentage: 85 %
Battery Voltage: 3.02 V
Brady Statistic AP VP Percent: 3.1 %
Brady Statistic AP VS Percent: 2.9 %
Brady Statistic AS VP Percent: 12 %
Brady Statistic AS VS Percent: 82 %
Brady Statistic RA Percent Paced: 5.7 %
Brady Statistic RV Percent Paced: 15 %
Date Time Interrogation Session: 20240909013620
Implantable Lead Connection Status: 753985
Implantable Lead Connection Status: 753985
Implantable Lead Implant Date: 19990719
Implantable Lead Implant Date: 19990719
Implantable Lead Location: 753859
Implantable Lead Location: 753860
Implantable Pulse Generator Implant Date: 20220907
Lead Channel Impedance Value: 380 Ohm
Lead Channel Impedance Value: 390 Ohm
Lead Channel Pacing Threshold Amplitude: 0.5 V
Lead Channel Pacing Threshold Amplitude: 1 V
Lead Channel Pacing Threshold Pulse Width: 0.5 ms
Lead Channel Pacing Threshold Pulse Width: 0.5 ms
Lead Channel Sensing Intrinsic Amplitude: 5 mV
Lead Channel Sensing Intrinsic Amplitude: 9.7 mV
Lead Channel Setting Pacing Amplitude: 2 V
Lead Channel Setting Pacing Amplitude: 2.5 V
Lead Channel Setting Pacing Pulse Width: 0.5 ms
Lead Channel Setting Sensing Sensitivity: 2 mV
Pulse Gen Model: 2272
Pulse Gen Serial Number: 6519619

## 2023-07-11 MED ORDER — HYDROCHLOROTHIAZIDE 25 MG PO TABS: 25.0000 mg | ORAL_TABLET | Freq: Every day | ORAL | 0 refills | Status: DC

## 2023-07-11 NOTE — Progress Notes (Signed)
Remote pacemaker transmission.   

## 2023-08-16 ENCOUNTER — Telehealth: Payer: Self-pay | Admitting: Internal Medicine

## 2023-08-16 ENCOUNTER — Other Ambulatory Visit: Payer: Self-pay | Admitting: *Deleted

## 2023-08-16 MED ORDER — HYDROCHLOROTHIAZIDE 25 MG PO TABS: 25.0000 mg | ORAL_TABLET | Freq: Every day | ORAL | 0 refills | Status: DC

## 2023-08-16 NOTE — Telephone Encounter (Signed)
*  STAT* If patient is at the pharmacy, call can be transferred to refill team.   1. Which medications need to be refilled? (please list name of each medication and dose if known) hydrochlorothiazide (HYDRODIURIL) 25 MG tablet   2. Which pharmacy/location (including street and city if local pharmacy) is medication to be sent to? [facility] [address], [place] - [address]   3. Do they need a 30 day or 90 day supply? 30

## 2023-09-03 NOTE — Progress Notes (Deleted)
  Electrophysiology Office Note:   ID:  AUM CAGGIANO, DOB Apr 15, 1961, MRN [id number]  Primary Cardiologist: None Electrophysiologist: Sherryl Manges, MD  {Click to update primary MD,subspecialty MD or APP then REFRESH:1}    History of Present Illness:   [name] Fields is a 62 y.o. male with h/o Advanced AV block, atrial tachycardia, HTN, OSA, h/o WPW, and NICM with recovered EF seen today for routine electrophysiology followup.   Since last being seen in our clinic the patient reports doing ***.  he denies chest pain, palpitations, dyspnea, PND, orthopnea, nausea, vomiting, dizziness, syncope, edema, weight gain, or early satiety.   Review of systems complete and found to be negative unless listed in HPI.   EP Information / Studies Reviewed:    EKG is ordered today. Personal review as below.       PPM Interrogation-  reviewed in detail today,  See PACEART report.  Device History: [employer] PPM implanted 1999 and gen change 2011 for high grade heart block   Echo 03/2021 LVEF 60-65%, Grade 1 DD  Physical Exam:   VS:  There were no vitals taken for this visit.   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

## 2023-09-04 ENCOUNTER — Ambulatory Visit: Payer: Self-pay | Admitting: Student

## 2023-09-04 DIAGNOSIS — I428 Other cardiomyopathies: Secondary | ICD-10-CM

## 2023-09-04 DIAGNOSIS — Z950 Presence of cardiac pacemaker: Secondary | ICD-10-CM

## 2023-09-04 DIAGNOSIS — I441 Atrioventricular block, second degree: Secondary | ICD-10-CM

## 2023-09-04 DIAGNOSIS — I429 Cardiomyopathy, unspecified: Secondary | ICD-10-CM

## 2023-09-04 NOTE — Progress Notes (Unsigned)
Cardiology Office Note:  .   Date:  09/04/2023  ID:  [name] Fields, DOB 31-Dec-1960, MRN [id number] PCP: Tollie Eth, NP  [facility] Providers Cardiologist:  None Electrophysiologist:  Sherryl Manges, MD  Sleep Medicine:  Armanda Magic, MD {  History of Present Illness: .   [name] Fields is a 62 y.o. 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 Graciela Husbands Dec 2022, doing well, atypical CP, suspect GERD and Rx prilosec.  He saw A. 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 He denies CP, SOB Does feel random fleeting palpitations that tend to make him take a deep breath for.   No dizzy spells, near syncope or syncope    Device information Abbott dual chamber PPM implanted 05/18/1998, gen change Jult 2011, and again Sept 2022    Studies Reviewed: Marland Kitchen    EKG done today and reviewed by myself:  SR, some A and V paced beats, 78bpm, no pre-excitation  DEVICE interrogation done today and reviewed by myself Battery and lead measurements are good + AMS episodes Total burden <1% All available EGMs reviewed 1:1 tachycardia Also True Aflutter, longest 8 sec   04/01/2021: TTE 1. Left ventricular ejection fraction, by estimation, is 60 to 65%. The  left ventricle has normal function. The left ventricle has no regional  wall motion abnormalities. Left ventricular diastolic parameters are  consistent with Grade I diastolic  dysfunction (impaired relaxation). Global longitudinal strain attempted  but not reported in the setting of LV foreshortening.   2. Right ventricular  systolic function is normal. The right ventricular  size is normal. Tricuspid regurgitation signal is inadequate for assessing  PA pressure.   3. The mitral valve is normal in structure. No evidence of mitral valve  regurgitation. No evidence of mitral stenosis.   4. The aortic valve is tricuspid. There is mild calcification of the  aortic valve. Aortic valve regurgitation is not visualized. No aortic  stenosis is present.   5. The inferior vena cava is normal in size with greater than 50%  respiratory variability, suggesting right atrial pressure of 3 mmHg.   Comparison(s): A prior study was performed on 08/08/2013. Prior images  reviewed side by side. Similar LVEF (prior is a contrasted study).    05/29/2020: stress myoview Nuclear stress EF: 52%. The left ventricular ejection fraction is mildly decreased (45-54%). There was no ST segment deviation noted during stress. The study is normal. This is a low risk study.   Normal stress nuclear study with no ischemia or infarction.  Gated ejection fraction 52% with normal wall motion.  Risk Assessment/Calculations:    Physical Exam:   VS:  There were no vitals taken for this visit.   Wt Readings from Last 3 Encounters:  12/29/22 244 lb (110.7 kg)  09/14/22 235 lb (106.6 kg)  08/29/22 240 lb (108.9 kg)    GEN: Well nourished, well developed in no acute distress NECK: No JVD; No carotid bruits CARDIAC: RRR, no murmurs, rubs, gallops RESPIRATORY:  CTA b/l without rales,  wheezing or rhonchi  ABDOMEN: Soft, non-tender, non-distended EXTREMITIES:  No edema; no calf tenderness or skin changes, No deformity   PPM site: is stable, no thinning, fluctuation, tethering  ASSESSMENT AND PLAN: .    PPM Intact function HR histograms look good  HTN Borderline acceptable today He reports checking it once in a while will get numbers like today and better Today has been a stressful morning for him  WPW by problem list ablated EKG is not  pre-excited  4. PAflutter Low burden CHA2DS2Vasc is one  Discussed adding low dose BB onboard for better BP as well his HR/rhythm He wants to hold off Will keep an eye on his BP Will be seeing his primary provider in a month or so and see how he is doing then    Dispo: remotes as usual, otherwise back in a year again, sooner if needed  Signed, Sheilah Pigeon, PA-C

## 2023-09-05 ENCOUNTER — Ambulatory Visit: Payer: Self-pay | Attending: Physician Assistant | Admitting: Physician Assistant

## 2023-09-05 ENCOUNTER — Encounter: Payer: Self-pay | Admitting: Physician Assistant

## 2023-09-05 VITALS — BP 140/80 | HR 78 | Ht 73.0 in | Wt 232.8 lb

## 2023-09-05 DIAGNOSIS — I10 Essential (primary) hypertension: Secondary | ICD-10-CM

## 2023-09-05 DIAGNOSIS — I456 Pre-excitation syndrome: Secondary | ICD-10-CM

## 2023-09-05 DIAGNOSIS — Z950 Presence of cardiac pacemaker: Secondary | ICD-10-CM

## 2023-09-05 DIAGNOSIS — I4892 Unspecified atrial flutter: Secondary | ICD-10-CM

## 2023-09-05 LAB — CUP PACEART INCLINIC DEVICE CHECK
Battery Remaining Longevity: 106 mo
Battery Voltage: 3.01 V
Brady Statistic RA Percent Paced: 5.2 %
Brady Statistic RV Percent Paced: 15 %
Date Time Interrogation Session: 20241105101340
Implantable Lead Connection Status: 753985
Implantable Lead Connection Status: 753985
Implantable Lead Implant Date: 19990719
Implantable Lead Implant Date: 19990719
Implantable Lead Location: 753859
Implantable Lead Location: 753860
Implantable Pulse Generator Implant Date: 20220907
Lead Channel Impedance Value: 375 Ohm
Lead Channel Impedance Value: 375 Ohm
Lead Channel Pacing Threshold Amplitude: 0.5 V
Lead Channel Pacing Threshold Amplitude: 0.5 V
Lead Channel Pacing Threshold Amplitude: 0.75 V
Lead Channel Pacing Threshold Amplitude: 0.75 V
Lead Channel Pacing Threshold Pulse Width: 0.5 ms
Lead Channel Pacing Threshold Pulse Width: 0.5 ms
Lead Channel Pacing Threshold Pulse Width: 0.5 ms
Lead Channel Pacing Threshold Pulse Width: 0.5 ms
Lead Channel Sensing Intrinsic Amplitude: 5 mV
Lead Channel Sensing Intrinsic Amplitude: 9.6 mV
Lead Channel Setting Pacing Amplitude: 2 V
Lead Channel Setting Pacing Amplitude: 2.5 V
Lead Channel Setting Pacing Pulse Width: 0.5 ms
Lead Channel Setting Sensing Sensitivity: 2 mV
Pulse Gen Model: 2272
Pulse Gen Serial Number: 6519619

## 2023-09-05 MED ORDER — HYDROCHLOROTHIAZIDE 25 MG PO TABS: 25.0000 mg | ORAL_TABLET | Freq: Every day | ORAL | 3 refills | Status: DC

## 2023-09-05 NOTE — Patient Instructions (Signed)
Medication Instructions:  Your physician recommends that you continue on your current medications as directed. Please refer to the Current Medication list given to you today. *If you need a refill on your cardiac medications before your next appointment, please call your pharmacy*   Lab Work: None Ordered   Testing/Procedures: None Ordered   Follow-Up: At [facility], you and your health needs are our priority.  As part of our continuing mission to provide you with exceptional heart care, we have created designated Provider Care Teams.  These Care Teams include your primary Cardiologist (physician) and Advanced Practice Providers (APPs -  Physician Assistants and Nurse Practitioners) who all work together to provide you with the care you need, when you need it.  We recommend signing up for the patient portal called "MyChart".  Sign up information is provided on this After Visit Summary.  MyChart is used to connect with patients for Virtual Visits (Telemedicine).  Patients are able to view lab/test results, encounter notes, upcoming appointments, etc.  Non-urgent messages can be sent to your provider as well.   To learn more about what you can do with MyChart, go to [email or web address].    Your next appointment:   12 month(s)  Provider:   Berton Mount, MD or Francis Dowse, PA-C  Other Instructions

## 2023-10-06 ENCOUNTER — Ambulatory Visit (INDEPENDENT_AMBULATORY_CARE_PROVIDER_SITE_OTHER): Payer: BC Managed Care – PPO

## 2023-10-06 DIAGNOSIS — I441 Atrioventricular block, second degree: Secondary | ICD-10-CM

## 2023-10-08 ENCOUNTER — Encounter: Payer: Self-pay | Admitting: Internal Medicine

## 2023-10-09 ENCOUNTER — Telehealth: Payer: Self-pay | Admitting: Internal Medicine

## 2023-10-09 LAB — CUP PACEART REMOTE DEVICE CHECK
Battery Remaining Longevity: 91 mo
Battery Remaining Percentage: 83 %
Battery Voltage: 3.02 V
Brady Statistic AP VP Percent: 1 %
Brady Statistic AP VS Percent: 1.5 %
Brady Statistic AS VP Percent: 16 %
Brady Statistic AS VS Percent: 81 %
Brady Statistic RA Percent Paced: 2.1 %
Brady Statistic RV Percent Paced: 17 %
Date Time Interrogation Session: 20241208011629
Implantable Lead Connection Status: 753985
Implantable Lead Connection Status: 753985
Implantable Lead Implant Date: 19990719
Implantable Lead Implant Date: 19990719
Implantable Lead Location: 753859
Implantable Lead Location: 753860
Implantable Pulse Generator Implant Date: 20220907
Lead Channel Impedance Value: 340 Ohm
Lead Channel Impedance Value: 360 Ohm
Lead Channel Pacing Threshold Amplitude: 0.5 V
Lead Channel Pacing Threshold Amplitude: 0.75 V
Lead Channel Pacing Threshold Pulse Width: 0.5 ms
Lead Channel Pacing Threshold Pulse Width: 0.5 ms
Lead Channel Sensing Intrinsic Amplitude: 11.1 mV
Lead Channel Sensing Intrinsic Amplitude: 4.7 mV
Lead Channel Setting Pacing Amplitude: 2 V
Lead Channel Setting Pacing Amplitude: 2.5 V
Lead Channel Setting Pacing Pulse Width: 0.5 ms
Lead Channel Setting Sensing Sensitivity: 2 mV
Pulse Gen Model: 2272
Pulse Gen Serial Number: 6519619

## 2023-10-09 NOTE — Telephone Encounter (Signed)
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

## 2023-10-09 NOTE — Telephone Encounter (Signed)
Pt states his dot expires 12/12 and he needs to pick letter up by Wednesday. Informed him we did receive messages earlier today and will get back with him asap.

## 2023-10-11 NOTE — Telephone Encounter (Signed)
Letter has been completed and pt notified via MyChart to pick up in front office.

## 2023-12-15 ENCOUNTER — Telehealth: Payer: Self-pay | Admitting: Internal Medicine

## 2023-12-15 NOTE — Telephone Encounter (Signed)
Pt asking that he receive a cb regarding a personal matter, did not want to give more information. Wanting to speak with Dr Graciela Husbands

## 2023-12-15 NOTE — Telephone Encounter (Signed)
Pt is wanting to know what [name] Fields goes to and would like Dr Graciela Husbands to call him on his cell phone.   Will forward for review.

## 2023-12-19 ENCOUNTER — Telehealth: Payer: Self-pay | Admitting: *Deleted

## 2023-12-19 ENCOUNTER — Other Ambulatory Visit: Payer: Self-pay | Admitting: *Deleted

## 2023-12-19 DIAGNOSIS — Z122 Encounter for screening for malignant neoplasm of respiratory organs: Secondary | ICD-10-CM

## 2023-12-19 DIAGNOSIS — Z87891 Personal history of nicotine dependence: Secondary | ICD-10-CM

## 2023-12-19 NOTE — Telephone Encounter (Signed)
 Lung Cancer Screening Narrative/Criteria Questionnaire (Cigarette Smokers Only- No Cigars/Pipes/vapes)   [name] Fields   SDMV:12/28/23 9:30 - [name]                                           01-02-1961              LDCT: 01/03/24 9:40 - [place]    63 y.o.   Phone: [phone]  Lung Screening Narrative (confirm age 11-77 yrs Medicare / 50-80 yrs Private pay insurance)   Insurance information:[employer] - XB:[id number]   Referring Provider:Sara Early   This screening involves an initial phone call with a team member from our program. It is called a shared decision making visit. The initial meeting is required by insurance and Medicare to make sure you understand the program. This appointment takes about 15-20 minutes to complete. The CT scan will completed at a separate date/time. This scan takes about 5-10 minutes to complete and you may eat and drink before and after the scan.  Criteria questions for Lung Cancer Screening:   Are you a current or former smoker? Former Age began smoking: 16   If you are a former smoker, what year did you quit smoking? 10/2023(within 15 yrs)   To calculate your smoking history, I need an accurate estimate of how many packs of cigarettes you smoked per day and for how many years. (Not just the number of PPD you are now smoking)   Years smoking 40 x Packs per day 1/2 = Pack years 20   (at least 20 pack yrs)   (Make sure they understand that we need to know how much they have smoked in the past, not just the number of PPD they are smoking now)  Do you have a personal history of cancer?  No    Do you have a family history of cancer? Yes  (cancer type and and relative) Mother ?  Are you coughing up blood?  No  Have you had unexplained weight loss of 15 lbs or more in the last 6 months? No  It looks like you meet all criteria.     Additional information: N/A

## 2023-12-21 NOTE — Telephone Encounter (Signed)
 Called and spoke with pt

## 2023-12-28 ENCOUNTER — Ambulatory Visit (INDEPENDENT_AMBULATORY_CARE_PROVIDER_SITE_OTHER): Payer: PRIVATE HEALTH INSURANCE | Admitting: Adult Health

## 2023-12-28 ENCOUNTER — Encounter: Payer: Self-pay | Admitting: Adult Health

## 2023-12-28 DIAGNOSIS — Z87891 Personal history of nicotine dependence: Secondary | ICD-10-CM

## 2023-12-28 NOTE — Patient Instructions (Signed)

## 2023-12-28 NOTE — Progress Notes (Signed)
  Virtual Visit via Telephone Note  I connected with [name] Fields , 12/28/23 9:45 AM by a telemedicine application and verified that I am speaking with the correct person using two identifiers.  Location: Patient: home Provider: home   I discussed the limitations of evaluation and management by telemedicine and the availability of in person appointments. The patient expressed understanding and agreed to proceed.   Shared Decision Making Visit Lung Cancer Screening Program [phone])   Eligibility: 63 y.o. Pack Years Smoking History Calculation = 20 pack years  (# packs/per year x # years smoked) Recent History of coughing up blood  no Unexplained weight loss? no ( >Than 15 pounds within the last 6 months ) Prior History Lung / other cancer no (Diagnosis within the last 5 years already requiring surveillance chest CT Scans). Smoking Status Former Smoker Former Smokers: Years since quit: < 1 year  Quit Date: 10/2023  Visit Components: Discussion included one or more decision making aids. YES Discussion included risk/benefits of screening. YES Discussion included potential follow up diagnostic testing for abnormal scans. YES Discussion included meaning and risk of over diagnosis. YES Discussion included meaning and risk of False Positives. YES Discussion included meaning of total radiation exposure. YES  Counseling Included: Importance of adherence to annual lung cancer LDCT screening. YES Impact of comorbidities on ability to participate in the program. YES Ability and willingness to under diagnostic treatment. YES  Smoking Cessation Counseling: Former Smokers:  Discussed the importance of maintaining cigarette abstinence. yes Diagnosis Code: Personal History of Nicotine Dependence. A21.308 Information about tobacco cessation classes and interventions provided to patient. Yes Patient provided with "ticket" for LDCT Scan. yes Written Order for Lung Cancer Screening with  LDCT placed in Epic. Yes (CT Chest Lung Cancer Screening Low Dose W/O CM) [id number]  Z12.2-Screening of respiratory organs Z87.891-Personal history of nicotine dependence   Danford Bad 12/28/23

## 2024-01-03 ENCOUNTER — Ambulatory Visit
Admission: RE | Admit: 2024-01-03 | Discharge: 2024-01-03 | Disposition: A | Discharge status: Home / Self Care | Payer: PRIVATE HEALTH INSURANCE | Source: Ambulatory Visit | Attending: Acute Care | Admitting: Acute Care

## 2024-01-03 DIAGNOSIS — Z87891 Personal history of nicotine dependence: Secondary | ICD-10-CM

## 2024-01-03 DIAGNOSIS — Z122 Encounter for screening for malignant neoplasm of respiratory organs: Secondary | ICD-10-CM

## 2024-01-11 ENCOUNTER — Ambulatory Visit: Payer: PRIVATE HEALTH INSURANCE

## 2024-01-11 DIAGNOSIS — I441 Atrioventricular block, second degree: Secondary | ICD-10-CM

## 2024-01-12 LAB — CUP PACEART REMOTE DEVICE CHECK
Battery Remaining Longevity: 91 mo
Battery Remaining Percentage: 81 %
Battery Voltage: 3.02 V
Brady Statistic AP VP Percent: 1.4 %
Brady Statistic AP VS Percent: 2.3 %
Brady Statistic AS VP Percent: 12 %
Brady Statistic AS VS Percent: 84 %
Brady Statistic RA Percent Paced: 3 %
Brady Statistic RV Percent Paced: 14 %
Date Time Interrogation Session: 20250313210055
Implantable Lead Connection Status: 753985
Implantable Lead Connection Status: 753985
Implantable Lead Implant Date: 19990719
Implantable Lead Implant Date: 19990719
Implantable Lead Location: 753859
Implantable Lead Location: 753860
Implantable Pulse Generator Implant Date: 20220907
Lead Channel Impedance Value: 390 Ohm
Lead Channel Impedance Value: 390 Ohm
Lead Channel Pacing Threshold Amplitude: 0.5 V
Lead Channel Pacing Threshold Amplitude: 0.75 V
Lead Channel Pacing Threshold Pulse Width: 0.5 ms
Lead Channel Pacing Threshold Pulse Width: 0.5 ms
Lead Channel Sensing Intrinsic Amplitude: 5 mV
Lead Channel Sensing Intrinsic Amplitude: 8.8 mV
Lead Channel Setting Pacing Amplitude: 2 V
Lead Channel Setting Pacing Amplitude: 2.5 V
Lead Channel Setting Pacing Pulse Width: 0.5 ms
Lead Channel Setting Sensing Sensitivity: 2 mV
Pulse Gen Model: 2272
Pulse Gen Serial Number: 6519619

## 2024-01-29 ENCOUNTER — Encounter: Payer: Self-pay | Admitting: Nurse Practitioner

## 2024-01-29 ENCOUNTER — Telehealth: Payer: Self-pay | Admitting: Acute Care

## 2024-01-29 NOTE — Telephone Encounter (Signed)
 Call report received:  IMPRESSION: 1. 10.4 mm lingular nodule. Lung-RADS 4A, suspicious. Follow up low-dose chest CT without contrast in 3 months (please use the following order, "CT CHEST LCS NODULE FOLLOW-UP W/O CM") is recommended. Alternatively, PET may be considered when there is a solid component 8mm or larger. These results will be called to the ordering clinician or representative by the Radiologist Assistant, and communication documented in the PACS or [employer]. 2. Age advanced left anterior descending coronary artery calcification. 3.  Aortic atherosclerosis (ICD10-I70.0). 4.  Emphysema (ICD10-J43.9).

## 2024-01-31 ENCOUNTER — Encounter: Payer: Self-pay | Admitting: Nurse Practitioner

## 2024-01-31 ENCOUNTER — Ambulatory Visit: Payer: PRIVATE HEALTH INSURANCE | Admitting: Nurse Practitioner

## 2024-01-31 VITALS — BP 132/80 | HR 60 | Ht 72.0 in | Wt 223.6 lb

## 2024-01-31 DIAGNOSIS — G8929 Other chronic pain: Secondary | ICD-10-CM

## 2024-01-31 DIAGNOSIS — Z23 Encounter for immunization: Secondary | ICD-10-CM | POA: Diagnosis not present

## 2024-01-31 DIAGNOSIS — Z720 Tobacco use: Secondary | ICD-10-CM | POA: Insufficient documentation

## 2024-01-31 DIAGNOSIS — M25561 Pain in right knee: Secondary | ICD-10-CM

## 2024-01-31 DIAGNOSIS — Z0000 Encounter for general adult medical examination without abnormal findings: Secondary | ICD-10-CM

## 2024-01-31 DIAGNOSIS — G4733 Obstructive sleep apnea (adult) (pediatric): Secondary | ICD-10-CM

## 2024-01-31 DIAGNOSIS — I429 Cardiomyopathy, unspecified: Secondary | ICD-10-CM | POA: Diagnosis not present

## 2024-01-31 DIAGNOSIS — Z950 Presence of cardiac pacemaker: Secondary | ICD-10-CM

## 2024-01-31 DIAGNOSIS — E559 Vitamin D deficiency, unspecified: Secondary | ICD-10-CM

## 2024-01-31 DIAGNOSIS — I10 Essential (primary) hypertension: Secondary | ICD-10-CM | POA: Diagnosis not present

## 2024-01-31 DIAGNOSIS — E782 Mixed hyperlipidemia: Secondary | ICD-10-CM

## 2024-01-31 DIAGNOSIS — R3911 Hesitancy of micturition: Secondary | ICD-10-CM

## 2024-01-31 DIAGNOSIS — Z113 Encounter for screening for infections with a predominantly sexual mode of transmission: Secondary | ICD-10-CM

## 2024-01-31 LAB — LIPID PANEL

## 2024-01-31 MED ORDER — HYDROCHLOROTHIAZIDE 25 MG PO TABS: 25.0000 mg | ORAL_TABLET | Freq: Every day | ORAL | 3 refills | Status: AC

## 2024-01-31 MED ORDER — VITAMIN D (ERGOCALCIFEROL) 1.25 MG (50000 UNIT) PO CAPS
50000.0000 [IU] | ORAL_CAPSULE | ORAL | 3 refills | Status: AC
Start: 2024-01-31 — End: ?

## 2024-01-31 NOTE — Assessment & Plan Note (Signed)
 Repeat labs today for monitoring. Has been out of weekly supplement for several months. Will send refills.

## 2024-01-31 NOTE — Assessment & Plan Note (Signed)
Chronic.  Reportedly sleeping well with CPAP.  No alarm symptoms present at this time.  Encouraged continuation of use and continuation of blood pressure monitoring.

## 2024-01-31 NOTE — Progress Notes (Signed)
 BP 132/80   Pulse 60   Ht 6' (1.829 m)   Wt 223 lb 9.6 oz (101.4 kg)   BMI 30.33 kg/m    Subjective:    Patient ID: [name] Fields, male    DOB: 10-May-1961, 63 y.o.   MRN: [id number]  HPI: [name] Fields is a 63 y.o. male presenting on 01/31/2024 for comprehensive medical examination.   History of Present Illness [name] Fields is a 63 year old male with Wolff-Parkinson-White syndrome who presents for a routine follow-up.  He has a history of Wolff-Parkinson-White syndrome, diagnosed in 1999, and received a pacemaker. He also has a diagnosis of cardiomyopathy, LVH, HLD, HTN, OSA. He experiences occasional palpitations, but nothing concerning. He reports the pacemaker does not activate frequently. No shortness of breath, chest pain, fatigue, cough, or dizziness.  He is currently trying to quit smoking, having reduced his smoking to eight cigarettes a day. He previously quit for four months but resumed due to stress.  He reports right knee pain with a sensation of the knee giving out, which has not improved with prior knee injections. He is unable to undergo an MRI due to pacemaker. He notes improvement in strength, as he can now ascend and descend stairs without difficulty. He has renewed his gym membership and has attended four times since renewal. He feels his strength and ROM are improving.  He experiences occasional leg swelling, which resolves by the next morning. No changes in bowel or bladder habits, but notes occasional slow urine flow, possibly related to kidney stones.   He checks his blood pressure regularly while on the road, with readings typically around 128/80 mmHg. He has lost weight, now weighing 223 pounds, and has been eating more fruit.  He has received two COVID-19 vaccines and is considering the pneumonia vaccine due to his smoking history.  Pertinent items are noted in HPI.   Most Recent Depression Screen:     01/31/2024   10:20 AM 09/14/2022   10:40 AM  08/29/2022    5:26 PM 03/14/2022    3:03 PM 03/10/2022    8:39 AM  Depression screen PHQ 2/9  Decreased Interest 0 1 0 0 0  Down, Depressed, Hopeless 0 0 0 0 0  PHQ - 2 Score 0 1 0 0 0  Altered sleeping  1     Tired, decreased energy  0     Change in appetite  0     Feeling bad or failure about yourself   0     Trouble concentrating  0     Moving slowly or fidgety/restless  0     Suicidal thoughts  0     PHQ-9 Score  2     Difficult doing work/chores  Not difficult at all      Most Recent Anxiety Screen:    09/14/2022   10:40 AM  GAD 7 : Generalized Anxiety Score  Nervous, Anxious, on Edge 0  Control/stop worrying 0  Worry too much - different things 1  Trouble relaxing 0  Restless 0  Easily annoyed or irritable 1  Afraid - awful might happen 0  Total GAD 7 Score 2  Anxiety Difficulty Not difficult at all   Most Recent Falls Screen:    01/31/2024   10:19 AM 09/14/2022   10:39 AM 08/29/2022    5:26 PM 05/26/2022   10:38 AM 03/14/2022    3:03 PM  Fall Risk   Falls in the past year?  1 1 0 1 0  Number falls in past yr: 1 1 0 1 0  Injury with Fall? 1 1 0 1 0  Comment rt. knee Hurt his knee     Risk for fall due to : Other (Comment)  No Fall Risks History of fall(s) Other (Comment)  Follow up Falls evaluation completed  Falls evaluation completed Falls evaluation completed Falls evaluation completed;Education provided    Past medical history, surgical history, medications, allergies, family history and social history reviewed with patient today and changes made to appropriate areas of the chart.  Past Medical History:  Past Medical History:  Diagnosis Date   Allergy to ACE inhibitors    Atrial tachycardia (HCC)    Cardiomyopathy, nonischemic (HCC)    GERD (gastroesophageal reflux disease)    Hiatal hernia    High-grade second-degree heart block     Qualifier: Diagnosis of  By: Graciela Husbands, MD, Susie Cassette    Hypertension    Permanent pacemaker-St. Jude     DOI  1999; gen re in the summer he has o pain syndrome BP 135% at time long so is his we have been short of his device is in a in a in a Foley disease since the stents removed about 3.0-4 seconds on the episode the patient is very 30 and a [place] when his pacemaker but when he and his placement 2011   Presence of permanent cardiac pacemaker    Sleep apnea    uses CPAP sometimes   WPW (Wolff-Parkinson-White syndrome)    s/pRFCA   Medications:  Current Outpatient Medications on File Prior to Visit  Medication Sig   Ascorbic Acid (VITAMIN C) 1000 MG tablet Take 1,000 mg by mouth daily.   No current facility-administered medications on file prior to visit.   Surgical History:  Past Surgical History:  Procedure Laterality Date   CARDIAC DEFIBRILLATOR PLACEMENT  2011   St Jude -- [employer]   CARPAL TUNNEL RELEASE Left 08/18/2020   Procedure: CARPAL TUNNEL RELEASE;  Surgeon: Cindee Salt, MD;  Location: [facility];  Service: Orthopedics;  Laterality: Left;  IV REGIONAL FOREARM BLOCK   CARPAL TUNNEL RELEASE Right 12/01/2020   Procedure: CARPAL TUNNEL RELEASE;  Surgeon: Cindee Salt, MD;  Location: [facility];  Service: Orthopedics;  Laterality: Right;  IV REGIONAL FOREARM BLOCK   PACEMAKER INSERTION  1999   explanted 2011   PPM GENERATOR CHANGEOUT N/A 07/07/2021   Procedure: PPM GENERATOR CHANGEOUT;  Surgeon: Duke Salvia, MD;  Location: [facility];  Service: Cardiovascular;  Laterality: N/A;   Allergies:  Allergies  Allergen Reactions   Lisinopril Swelling    Lip swelling   Social History:  Social History   Socioeconomic History   Marital status: Divorced    Spouse name: Not on file   Number of children: Not on file   Years of education: Not on file   Highest education level: Not on file  Occupational History   Occupation: truck driver    Employer: [employer]  Tobacco Use   Smoking status: Every Day    Current packs/day: 0.50    Average  packs/day: 0.5 packs/day for 47.2 years (23.6 ttl pk-yrs)    Types: Cigarettes    Start date: 1978   Smokeless tobacco: Never   Tobacco comments:    Pt has stopped and satated back  Vaping Use   Vaping status: Never Used  Substance and Sexual Activity   Alcohol  use: Yes    Alcohol/week: 7.0 standard drinks of alcohol    Types: 7 Standard drinks or equivalent per week    Comment: social   Drug use: No   Sexual activity: Yes  Other Topics Concern   Not on file  Social History Narrative   Not on file   Social Drivers of Health   Financial Resource Strain: Not on file  Food Insecurity: Not on file  Transportation Needs: Not on file  Physical Activity: Not on file  Stress: Not on file  Social Connections: Unknown (03/14/2022)   Received from [facility], [facility]   Social Network    Social Network: Not on file  Intimate Partner Violence: Unknown (02/03/2022)   Received from [facility], [facility]   HITS    Physically Hurt: Not on file    Insult or Talk Down To: Not on file    Threaten Physical Harm: Not on file    Scream or Curse: Not on file   Social History   Tobacco Use  Smoking Status Every Day   Current packs/day: 0.50   Average packs/day: 0.5 packs/day for 47.2 years (23.6 ttl pk-yrs)   Types: Cigarettes   Start date: 1978  Smokeless Tobacco Never  Tobacco Comments   Pt has stopped and satated back   Social History   Substance and Sexual Activity  Alcohol Use Yes   Alcohol/week: 7.0 standard drinks of alcohol   Types: 7 Standard drinks or equivalent per week   Comment: social   Family History:  Family History  Problem Relation Age of Onset   Breast cancer Mother    Heart disease Mother    Heart Problems Mother        pacemaker   Prostate cancer Father    Hypertension Father        Objective:    BP 132/80   Pulse 60   Ht 6' (1.829 m)   Wt 223 lb 9.6 oz (101.4 kg)   BMI 30.33 kg/m   Wt Readings from Last 3 Encounters:  01/31/24  223 lb 9.6 oz (101.4 kg)  09/05/23 232 lb 12.8 oz (105.6 kg)  12/29/22 244 lb (110.7 kg)    Physical Exam Vitals and nursing note reviewed.  Constitutional:      Appearance: Normal appearance. He is well-developed.  HENT:     Head: Normocephalic and atraumatic.     Right Ear: Hearing, tympanic membrane, ear canal and external ear normal.     Left Ear: Hearing, tympanic membrane, ear canal and external ear normal.     Nose: Nose normal.     Right Sinus: No maxillary sinus tenderness or frontal sinus tenderness.     Left Sinus: No maxillary sinus tenderness or frontal sinus tenderness.     Mouth/Throat:     Lips: Pink.     Mouth: Mucous membranes are moist.     Pharynx: Oropharynx is clear.  Eyes:     General: Lids are normal. Vision grossly intact.     Extraocular Movements: Extraocular movements intact.     Conjunctiva/sclera: Conjunctivae normal.     Pupils: Pupils are equal, round, and reactive to light.     Funduscopic exam:    Right eye: No hemorrhage. Red reflex present.        Left eye: No hemorrhage. Red reflex present.    Visual Fields: Right eye visual fields normal and left eye visual fields normal.  Neck:     Thyroid: No thyromegaly.  Vascular: No carotid bruit or JVD.  Cardiovascular:     Rate and Rhythm: Normal rate and regular rhythm.     Chest Wall: PMI is not displaced.     Pulses: Normal pulses.          Dorsalis pedis pulses are 2+ on the right side and 2+ on the left side.       Posterior tibial pulses are 2+ on the right side and 2+ on the left side.     Heart sounds: Normal heart sounds. No murmur heard. Pulmonary:     Effort: Pulmonary effort is normal. No respiratory distress.     Breath sounds: Normal breath sounds.  Chest:  Breasts:    Breasts are symmetrical.  Abdominal:     General: Bowel sounds are normal. There is no distension or abdominal bruit.     Palpations: Abdomen is soft. There is no hepatomegaly, splenomegaly or mass.      Tenderness: There is no abdominal tenderness. There is no right CVA tenderness, left CVA tenderness, guarding or rebound.  Musculoskeletal:        General: Normal range of motion.     Cervical back: Full passive range of motion without pain, normal range of motion and neck supple. No tenderness. No spinous process tenderness or muscular tenderness.     Right lower leg: No edema.     Left lower leg: No edema.  Feet:     Right foot:     Toenail Condition: Right toenails are normal.     Left foot:     Toenail Condition: Left toenails are normal.  Lymphadenopathy:     Cervical: No cervical adenopathy.     Upper Body:     Right upper body: No supraclavicular adenopathy.     Left upper body: No supraclavicular adenopathy.  Skin:    General: Skin is warm and dry.     Capillary Refill: Capillary refill takes less than 2 seconds.     Nails: There is no clubbing.     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 Lipoma to the left upper chest wall.   Neurological:     General: No focal deficit present.     Mental Status: He is alert and oriented to person, place, and time.     Cranial Nerves: No cranial nerve deficit.     Sensory: Sensation is intact. No sensory deficit.     Motor: Motor function is intact. No weakness.     Coordination: Coordination is intact. Coordination normal.     Gait: Gait is intact. Gait normal.     Deep Tendon Reflexes: Reflexes are normal and symmetric.  Psychiatric:        Attention and Perception: Attention normal.        Mood and Affect: Mood normal.        Speech: Speech normal.        Behavior: Behavior normal. Behavior is cooperative.        Thought Content: Thought content normal.        Cognition and Memory: Cognition and memory normal.        Judgment: Judgment normal.      Results for orders placed or performed in visit on 01/11/24  CUP PACEART REMOTE DEVICE CHECK   Collection Time: 01/11/24  9:00 PM  Result Value Ref Range    Date Time Interrogation Session [phone]    Pulse Generator Manufacturer SJ[employer]    Pulse  Gen Model 2272 Assurity MRI    Pulse Gen Serial Number [id number]    Clinic Name [facility]    Implantable Pulse Generator Type Implantable Pulse Generator    Implantable Pulse Generator Implant Date [id number]    Implantable Lead Manufacturer INTR    Implantable Lead Model 438-10    Implantable Lead Serial Number    Implantable Lead Implant Date [id number]    Implantable Lead Location Detail 1 APEX    Implantable Lead Location [id number]    Implantable Lead Connection Status [id number]    Implantable Lead Manufacturer INTR    Implantable Lead Model 438-10    Implantable Lead Serial Number    Implantable Lead Implant Date [id number]    Implantable Lead Location Detail 1 APPENDAGE    Implantable Lead Location [id number]    Implantable Lead Connection Status [phone]    Lead Channel Setting Sensing Sensitivity 2.0 mV   Lead Channel Setting Sensing Adaptation Mode Fixed Pacing    Lead Channel Setting Pacing Amplitude 2.0 V   Lead Channel Setting Pacing Pulse Width 0.5 ms   Lead Channel Setting Pacing Amplitude 2.5 V   Lead Channel Status NULL    Lead Channel Impedance Value 390 ohm   Lead Channel Sensing Intrinsic Amplitude 5.0 mV   Lead Channel Pacing Threshold Amplitude 0.5 V   Lead Channel Pacing Threshold Pulse Width 0.5 ms   Lead Channel Status NULL    Lead Channel Impedance Value 390 ohm   Lead Channel Sensing Intrinsic Amplitude 8.8 mV   Lead Channel Pacing Threshold Amplitude 0.75 V   Lead Channel Pacing Threshold Pulse Width 0.5 ms   Battery Status MOS    Battery Remaining Longevity 91 mo   Battery Remaining Percentage 81.0 %   Battery Voltage 3.02 V   Brady Statistic RA Percent Paced 3.0 %   Brady Statistic RV Percent Paced 14.0 %   Brady Statistic AP VP Percent 1.4 %   Brady Statistic AS VP Percent 12.0 %   Brady Statistic AP VS Percent 2.3 %   Brady Statistic AS VS  Percent 84.0 %      Assessment & Plan:   Problem List Items Addressed This Visit     Hypertension   Followed with Dr. Graciela Husbands. Well controlled with home readings consistently below 130/80. No concerning symptoms. Labs pending for electrolyte evaluation today.       Relevant Medications   hydrochlorothiazide (HYDRODIURIL) 25 MG tablet   Other Relevant Orders   CBC with Differential/Platelet   CMP14+EGFR   Hemoglobin A1c   Lipid panel   Cardiomyopathy (HCC)   Chronic.  Last echo 03/2021. No alarm symptoms present at this time.  Recommend routine daily exercise with slow and steady increase with avoidance of overly strenuous activities at onset.  Labs today.      Relevant Medications   hydrochlorothiazide (HYDRODIURIL) 25 MG tablet   Other Relevant Orders   CBC with Differential/Platelet   CMP14+EGFR   Hemoglobin A1c   Lipid panel   PACEMAKER, PERMANENT   Wolff-Parkinson-White syndrome with pacemaker since 1999. Occasional palpitations but pacemaker rarely activates. No recent symptoms of dyspnea or chest pain.  Followed with cardiology routinely.       OSA on CPAP   Chronic.  Reportedly sleeping well with CPAP.  No alarm symptoms present at this time.  Encouraged continuation of use and continuation of blood pressure monitoring.      Relevant Orders   CBC with Differential/Platelet   CMP14+EGFR  Hemoglobin A1c   Lipid panel   Vitamin D deficiency   Repeat labs today for monitoring. Has been out of weekly supplement for several months. Will send refills.       Relevant Medications   Vitamin D, Ergocalciferol, (DRISDOL) 1.25 MG (50000 UNIT) CAPS capsule   Other Relevant Orders   VITAMIN D 25 Hydroxy (Vit-D Deficiency, Fractures)   Right knee pain   Improvement in knee strength and ability to navigate stairs without difficulty. No relief from previous knee injection. MRI not performed. Exercises to strengthen thigh muscles recommended to support the knee.   - Recommend  exercises to strengthen thigh muscles, focusing on both the front and back of the thigh.        Mixed hyperlipidemia   Not currently on medication management. Will monitor labs today and make changes to plan of care as necessary.       Relevant Medications   hydrochlorothiazide (HYDRODIURIL) 25 MG tablet   Other Relevant Orders   CBC with Differential/Platelet   CMP14+EGFR   Hemoglobin A1c   Lipid panel   Encounter for annual physical exam   CPE completed today. Review of HM activities and recommendations discussed and provided on AVS. Anticipatory guidance, diet, and exercise recommendations provided. Medications, allergies, and hx reviewed and updated as necessary. Orders placed as listed below.  Plan: - Labs ordered. Will make changes as necessary based on results.  - I will review these results and send recommendations via MyChart or a telephone call.  - F/U with CPE in 1 year or sooner for acute/chronic health needs as directed.        Smoking trying to quit   Actively trying to quit smoking, reduced to eight cigarettes per day. Previously quit for four months but resumed. Aware of smoking cessation resources but has not utilized them due to busy schedule.  He plans to look into this.       Other Visit Diagnoses       Need for vaccination against Streptococcus pneumoniae    -  Primary   Relevant Orders   Pneumococcal conjugate vaccine 20-valent (Prevnar 20) (Completed)     Urinary hesitancy       Relevant Orders   PSA Total (Reflex To Free)     Routine screening for STI (sexually transmitted infection)       Relevant Orders   HIV Antibody (routine testing w rflx)   Hepatitis C antibody   HAV, HBV, HCV   STI Profile, CT/NG/TV   RPR        Follow up plan: NEXT PREVENTATIVE PHYSICAL DUE IN 1 YEAR. Return in about 1 year (around 01/30/2025) for CPE.  LABORATORY TESTING:  Health maintenance labs ordered today, if applicable.    PATIENT COUNSELING:   For all adult  patients, I recommend A well balanced diet low in saturated fats, cholesterol, and moderation in carbohydrates.  This can be as simple as monitoring portion sizes and cutting back on sugary beverages such as soda and juice to start with.    Daily water consumption of at least 64 ounces.  Physical activity at least 180 minutes per week, if just starting out.  This can be as simple as taking the stairs instead of the elevator and walking 2-3 laps around the office  purposefully every day.   STD protection, partner selection, and regular testing if high risk.  Limited consumption of alcoholic beverages if alcohol is consumed. For men, I recommend no more than 14  alcoholic beverages per week, spread out throughout the week (max 2 per day). Avoid "binge" drinking or consuming large quantities of alcohol in one setting.  Please let me know if you feel you may need help with reduction or quitting alcohol consumption.   Avoidance of nicotine, if used. Please let me know if you feel you may need help with reduction or quitting nicotine use.   Daily mental health attention. This can be in the form of 5 minute daily meditation, prayer, journaling, yoga, reflection, etc.  Purposeful attention to your emotions and mental state can significantly improve your overall wellbeing and Health.  Please know that I am here to help you with all of your health care goals and am happy to work with you to find a solution that works best for you.  The greatest advice I have received with any changes in life are to take it one step at a time, that even means if all you can focus on is the next 60 seconds, then do that and celebrate your victories.  With any changes in life, you will have set backs, and that is OK. The important thing to remember is, if you have a set back, it is not a failure, it is an opportunity to try again!  Health Maintenance Recommendations Screening Testing Mammogram Every 1 -2 years based  on history and risk factors Starting at age 52 Pap Smear Ages 21-39 every 3 years Ages 15-65 every 5 years with HPV testing More frequent testing may be required based on results and history Colon Cancer Screening Every 1-10 years based on test performed, risk factors, and history Starting at age 5 Bone Density Screening Every 2-10 years based on history Starting at age 62 for women Recommendations for men differ based on medication usage, history, and risk factors AAA Screening One time ultrasound Men 70-33 years old who have every smoked Lung Cancer Screening Low Dose Lung CT every 12 months Age 35-80 years with a 30 pack-year smoking history who still smoke or who have quit within the last 15 years   Screening Labs Routine  Labs: Complete Blood Count (CBC), Complete Metabolic Panel (CMP), Cholesterol (Lipid Panel) Every 6-12 months based on history and medications May be recommended more frequently based on current conditions or previous results Hemoglobin A1c Lab Every 3-12 months based on history and previous results Starting at age 28 or earlier with diagnosis of diabetes, high cholesterol, BMI >26, and/or risk factors Frequent monitoring for patients with diabetes to ensure blood sugar control Thyroid Panel (TSH) Every 6 months based on history, symptoms, and risk factors May be repeated more often if on medication HIV One time testing for all patients 63 and older May be repeated more frequently for patients with increased risk factors or exposure Hepatitis C One time testing for all patients 6 and older May be repeated more frequently for patients with increased risk factors or exposure Gonorrhea, Chlamydia Every 12 months for all sexually active persons 13-24 years Additional monitoring may be recommended for those who are considered high risk or who have symptoms Every 12 months for any woman on birth control, regardless of sexual activity PSA Men 67-54 years  old with risk factors Additional screening may be recommended from age 69-69 based on risk factors, symptoms, and history  Vaccine Recommendations Tetanus Booster All adults every 10 years Flu Vaccine All patients 6 months and older every year COVID Vaccine All patients 12 years and older Initial dosing with booster  May recommend additional booster based on age and health history HPV Vaccine 2 doses all patients age 80-26 Dosing may be considered for patients over 26 Shingles Vaccine (Shingrix) 2 doses all adults 55 years and older Pneumonia (Pneumovax 51) All adults 65 years and older May recommend earlier dosing based on health history One year apart from Prevnar 13 Pneumonia (Prevnar 6) All adults 65 years and older Dosed 1 year after Pneumovax 23 Pneumonia (Prevnar 20) One time alternative to the two dosing of 13 and 23 For all adults with initial dose of 23, 20 is recommended 1 year later For all adults with initial dose of 13, 23 is still recommended as second option 1 year later  Additional Screening, Testing, and Vaccinations may be recommended on an individualized basis based on family history, health history, risk factors, and/or exposure.

## 2024-01-31 NOTE — Assessment & Plan Note (Signed)
 Chronic.  Last echo 03/2021. No alarm symptoms present at this time.  Recommend routine daily exercise with slow and steady increase with avoidance of overly strenuous activities at onset.  Labs today.

## 2024-01-31 NOTE — Assessment & Plan Note (Signed)

## 2024-01-31 NOTE — Assessment & Plan Note (Signed)
 Followed with Dr. Graciela Husbands. Well controlled with home readings consistently below 130/80. No concerning symptoms. Labs pending for electrolyte evaluation today.

## 2024-01-31 NOTE — Assessment & Plan Note (Signed)
 Not currently on medication management. Will monitor labs today and make changes to plan of care as necessary.

## 2024-01-31 NOTE — Patient Instructions (Addendum)
 Here is some  information about lipomas: Definition: Lipomas are benign (non-cancerous) fatty lumps that can develop anywhere in the body where fat cells are present, commonly on the neck, chest, back, shoulders, arms, and thighs.  Characteristics: They are generally round or oval, soft to the touch, and can range in size from 2 to 10 cm. Most lipomas are painless and do not require treatment unless they cause discomfort or cosmetic concerns.  Symptoms: Lipomas usually do not cause symptoms, but if they grow large or press on nerves or other structures, they may lead to discomfort.  Diagnosis: Diagnosis is typically made through a physical examination, and imaging tests may be used if necessary to confirm the diagnosis.  Treatment: Most lipomas do not require treatment. However, if they are bothersome, they can be surgically removed.   I will watch for the CT scan to come back and will let you know what it shows.   I have sent the refills for your blood pressure medication. Be sure to let me or Dr. Graciela Husbands know if your BP goes up higher than 130/80.

## 2024-01-31 NOTE — Assessment & Plan Note (Signed)
 Actively trying to quit smoking, reduced to eight cigarettes per day. Previously quit for four months but resumed. Aware of smoking cessation resources but has not utilized them due to busy schedule.  He plans to look into this.

## 2024-01-31 NOTE — Assessment & Plan Note (Signed)
 Improvement in knee strength and ability to navigate stairs without difficulty. No relief from previous knee injection. MRI not performed. Exercises to strengthen thigh muscles recommended to support the knee.   - Recommend exercises to strengthen thigh muscles, focusing on both the front and back of the thigh.

## 2024-01-31 NOTE — Assessment & Plan Note (Signed)
 Wolff-Parkinson-White syndrome with pacemaker since 1999. Occasional palpitations but pacemaker rarely activates. No recent symptoms of dyspnea or chest pain.  Followed with cardiology routinely.

## 2024-02-01 ENCOUNTER — Other Ambulatory Visit: Payer: Self-pay

## 2024-02-01 ENCOUNTER — Telehealth: Payer: Self-pay | Admitting: Acute Care

## 2024-02-01 DIAGNOSIS — I700 Atherosclerosis of aorta: Secondary | ICD-10-CM

## 2024-02-01 DIAGNOSIS — I2510 Atherosclerotic heart disease of native coronary artery without angina pectoris: Secondary | ICD-10-CM

## 2024-02-01 DIAGNOSIS — Z122 Encounter for screening for malignant neoplasm of respiratory organs: Secondary | ICD-10-CM

## 2024-02-01 DIAGNOSIS — R911 Solitary pulmonary nodule: Secondary | ICD-10-CM

## 2024-02-01 DIAGNOSIS — Z87891 Personal history of nicotine dependence: Secondary | ICD-10-CM

## 2024-02-01 DIAGNOSIS — I7090 Unspecified atherosclerosis: Secondary | ICD-10-CM

## 2024-02-01 LAB — CBC WITH DIFFERENTIAL/PLATELET
Basophils Absolute: 0 10*3/uL (ref 0.0–0.2)
Basos: 0 %
EOS (ABSOLUTE): 0.1 10*3/uL (ref 0.0–0.4)
Eos: 1 %
Hematocrit: 46 % (ref 37.5–51.0)
Hemoglobin: 15.6 g/dL (ref 13.0–17.7)
Immature Grans (Abs): 0 10*3/uL (ref 0.0–0.1)
Immature Granulocytes: 0 %
Lymphocytes Absolute: 2.6 10*3/uL (ref 0.7–3.1)
Lymphs: 38 %
MCH: 30.2 pg (ref 26.6–33.0)
MCHC: 33.9 g/dL (ref 31.5–35.7)
MCV: 89 fL (ref 79–97)
Monocytes Absolute: 0.5 10*3/uL (ref 0.1–0.9)
Monocytes: 7 %
Neutrophils Absolute: 3.6 10*3/uL (ref 1.4–7.0)
Neutrophils: 54 %
Platelets: 333 10*3/uL (ref 150–450)
RBC: 5.17 x10E6/uL (ref 4.14–5.80)
RDW: 13.8 % (ref 11.6–15.4)
WBC: 6.7 10*3/uL (ref 3.4–10.8)

## 2024-02-01 LAB — CMP14+EGFR
ALT: 23 IU/L (ref 0–44)
AST: 20 IU/L (ref 0–40)
Albumin: 4 g/dL (ref 3.9–4.9)
Alkaline Phosphatase: 123 IU/L — ABNORMAL HIGH (ref 44–121)
BUN/Creatinine Ratio: 14 (ref 10–24)
BUN: 13 mg/dL (ref 8–27)
Bilirubin Total: 0.4 mg/dL (ref 0.0–1.2)
CO2: 19 mmol/L — ABNORMAL LOW (ref 20–29)
Calcium: 9.7 mg/dL (ref 8.6–10.2)
Chloride: 103 mmol/L (ref 96–106)
Creatinine, Ser: 0.96 mg/dL (ref 0.76–1.27)
Globulin, Total: 2.9 g/dL (ref 1.5–4.5)
Glucose: 95 mg/dL (ref 70–99)
Potassium: 4 mmol/L (ref 3.5–5.2)
Sodium: 139 mmol/L (ref 134–144)
Total Protein: 6.9 g/dL (ref 6.0–8.5)
eGFR: 89 mL/min/{1.73_m2} (ref >59)

## 2024-02-01 LAB — PSA TOTAL (REFLEX TO FREE): Prostate Specific Ag, Serum: 8.9 ng/mL — ABNORMAL HIGH (ref 0.0–4.0)

## 2024-02-01 LAB — HEMOGLOBIN A1C
Est. average glucose Bld gHb Est-mCnc: 123 mg/dL
Hgb A1c MFr Bld: 5.9 % — ABNORMAL HIGH (ref 4.8–5.6)

## 2024-02-01 LAB — FPSA% REFLEX
% FREE PSA: 10.1 %
PSA, FREE: 0.9 ng/mL

## 2024-02-01 LAB — LIPID PANEL
Cholesterol, Total: 157 mg/dL (ref 100–199)
HDL: 51 mg/dL (ref >39)
LDL CALC COMMENT:: 3.1 ratio (ref 0.0–5.0)
LDL Chol Calc (NIH): 82 mg/dL (ref 0–99)
Triglycerides: 135 mg/dL (ref 0–149)
VLDL Cholesterol Cal: 24 mg/dL (ref 5–40)

## 2024-02-01 LAB — VITAMIN D 25 HYDROXY (VIT D DEFICIENCY, FRACTURES): Vit D, 25-Hydroxy: 29.1 ng/mL — ABNORMAL LOW (ref 30.0–100.0)

## 2024-02-01 NOTE — Telephone Encounter (Signed)
 I have called the patient with the results of his LDCT. This is his baseline first scan , and he does not have any CT Chest imaging for comparison in the [facility] system or in Care Everywhere.  His scan was read as a LR 4 A. He has a 10.4 mm lingular nodule. I explained we will need to do a follow up scan in 3 months. He is in agreement with this plan. We will call him with the results of the 3 month follow up.  Please place order for 3 month follow up scan. This will be due after 04/04/2024. ( Original scan was done 01/03/2024 and read 01/29/2024). I have added Enid Skeens to the message, as well as the patient's cardiologist , Dr. Graciela Husbands.   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 I wanted to make sure you were both aware. Let me know if you have any questions or concerns. Thanks so much

## 2024-02-01 NOTE — Telephone Encounter (Addendum)
 I have spoken with the patient and he is scheduled for his f/u scan at [facility] on 04/04/2024 at 9:00am. 3 month f/u scan order placed.

## 2024-02-02 LAB — STI PROFILE, CT/NG/TV
HCV Ab: NONREACTIVE
HIV Screen 4th Generation wRfx: NONREACTIVE
Hep B Core Total Ab: POSITIVE — AB
Hep B Surface Ab, Qual: REACTIVE
Hepatitis B Surface Ag: NEGATIVE
RPR Ser Ql: NONREACTIVE

## 2024-02-02 LAB — HCV INTERPRETATION

## 2024-02-02 LAB — HAV, HBV, HCV: hep A Total Ab: NEGATIVE

## 2024-02-02 LAB — HEPATITIS C ANTIBODY

## 2024-02-05 NOTE — Telephone Encounter (Signed)
 Lets order Coronary CTA given the LAD calcification noted on the lung scan please

## 2024-02-06 MED ORDER — METOPROLOL TARTRATE 100 MG PO TABS: ORAL_TABLET | ORAL | 0 refills | Status: DC

## 2024-02-06 NOTE — Telephone Encounter (Signed)
 Spoke with pt and advised per Dr Graciela Husbands pt will need cardiac CT due to calcifications visualized on pulmonary CT.  Reviewed instructions placed on MyChart.  Pt verbalizes understanding and agrees with current plan.

## 2024-02-06 NOTE — Addendum Note (Signed)
 Addended by: Alois Cliche on: 02/06/2024 08:46 AM   Modules accepted: Orders

## 2024-02-07 ENCOUNTER — Other Ambulatory Visit: Payer: Self-pay | Admitting: Nurse Practitioner

## 2024-02-07 ENCOUNTER — Encounter: Payer: Self-pay | Admitting: Nurse Practitioner

## 2024-02-07 DIAGNOSIS — R972 Elevated prostate specific antigen [PSA]: Secondary | ICD-10-CM

## 2024-02-16 ENCOUNTER — Ambulatory Visit (HOSPITAL_COMMUNITY)
Admission: RE | Admit: 2024-02-16 | Discharge: 2024-02-16 | Disposition: A | Discharge status: Home / Self Care | Payer: PRIVATE HEALTH INSURANCE | Source: Ambulatory Visit | Attending: Internal Medicine | Admitting: Internal Medicine

## 2024-02-16 DIAGNOSIS — I2510 Atherosclerotic heart disease of native coronary artery without angina pectoris: Secondary | ICD-10-CM | POA: Insufficient documentation

## 2024-02-16 DIAGNOSIS — I700 Atherosclerosis of aorta: Secondary | ICD-10-CM | POA: Insufficient documentation

## 2024-02-16 MED ORDER — NITROGLYCERIN 0.4 MG SL SUBL
0.8000 mg | SUBLINGUAL_TABLET | Freq: Once | SUBLINGUAL | Status: AC
  Administered 2024-02-16: 0.8 mg via SUBLINGUAL

## 2024-02-16 MED ORDER — NITROGLYCERIN 0.4 MG SL SUBL
SUBLINGUAL_TABLET | SUBLINGUAL | Status: AC
  Filled 2024-02-16: qty 2

## 2024-02-16 MED ORDER — IOHEXOL 350 MG/ML SOLN
100.0000 mL | Freq: Once | INTRAVENOUS | Status: AC | PRN
  Administered 2024-02-16: 100 mL via INTRAVENOUS

## 2024-02-16 NOTE — Progress Notes (Signed)
 Patient presents for cardiac CT and tolerated procedure without incident.  Patient maintained acceptable vital signs, denies symptoms.  Patient ambulated out of department with a steady gait.

## 2024-02-23 NOTE — Addendum Note (Signed)
 Addended by: Lott Rouleau A on: 02/23/2024 02:10 PM   Modules accepted: Orders

## 2024-02-23 NOTE — Progress Notes (Signed)
 Remote pacemaker transmission.

## 2024-03-10 NOTE — Progress Notes (Incomplete)
 Chief Complaint: No chief complaint on file.   History of Present Illness:  [name] Fields is a 63 y.o. male who is seen in consultation from Early, [name] Albe, NP for evaluation of elevated PSA--recently 8.9 (10% free).   Past Medical History:  Past Medical History:  Diagnosis Date   Allergy to ACE inhibitors    Atrial tachycardia (HCC)    Cardiomyopathy, nonischemic (HCC)    GERD (gastroesophageal reflux disease)    Hiatal hernia    High-grade second-degree heart block     Qualifier: Diagnosis of  By: [name] Clan, MD, [name] Fields    Hypertension    Permanent pacemaker-St. Jude     DOI 1999; gen re in the summer he has o pain syndrome BP 135% at time long so is his we have been short of his device is in a in a in a Foley disease since the stents removed about 3.0-4 seconds on the episode the patient is very 50 and a [place]  when his pacemaker but when he and his placement 2011   Presence of permanent cardiac pacemaker    Sleep apnea    uses CPAP sometimes   WPW (Wolff-Parkinson-White syndrome)    s/pRFCA    Past Surgical History:  Past Surgical History:  Procedure Laterality Date   CARDIAC DEFIBRILLATOR PLACEMENT  2011   St Jude -- [employer] RF   CARPAL TUNNEL RELEASE Left 08/18/2020   Procedure: CARPAL TUNNEL RELEASE;  Surgeon: [name] Sample, MD;  Location: [facility];  Service: Orthopedics;  Laterality: Left;  IV REGIONAL FOREARM BLOCK   CARPAL TUNNEL RELEASE Right 12/01/2020   Procedure: CARPAL TUNNEL RELEASE;  Surgeon: [name] Sample, MD;  Location: [facility];  Service: Orthopedics;  Laterality: Right;  IV REGIONAL FOREARM BLOCK   PACEMAKER INSERTION  1999   explanted 2011   PPM GENERATOR CHANGEOUT N/A 07/07/2021   Procedure: PPM GENERATOR CHANGEOUT;  Surgeon: [name] Goodwill, MD;  Location: [facility] INVASIVE CV LAB;  Service: Cardiovascular;  Laterality: N/A;    Allergies:  Allergies  Allergen Reactions   Lisinopril  Swelling    Lip swelling     Family History:  Family History  Problem Relation Age of Onset   Breast cancer Mother    Heart disease Mother    Heart Problems Mother        pacemaker   Prostate cancer Father    Hypertension Father     Social History:  Social History   Tobacco Use   Smoking status: Every Day    Current packs/day: 0.50    Average packs/day: 0.5 packs/day for 47.4 years (23.7 ttl pk-yrs)    Types: Cigarettes    Start date: 1978   Smokeless tobacco: Never   Tobacco comments:    Pt has stopped and satated back  Vaping Use   Vaping status: Never Used  Substance Use Topics   Alcohol use: Yes    Alcohol/week: 7.0 standard drinks of alcohol    Types: 7 Standard drinks or equivalent per week    Comment: social   Drug use: No    Review of symptoms:  Constitutional:  Negative for unexplained weight loss, night sweats, fever, chills ENT:  Negative for nose bleeds, sinus pain, painful swallowing CV:  Negative for chest pain, shortness of breath, exercise intolerance, palpitations, loss of consciousness Resp:  Negative for cough, wheezing, shortness of breath GI:  Negative for nausea, vomiting, diarrhea, bloody stools GU:  Positives noted in HPI; otherwise  negative for gross hematuria, dysuria, urinary incontinence Neuro:  Negative for seizures, poor balance, limb weakness, slurred speech Psych:  Negative for lack of energy, depression, anxiety Endocrine:  Negative for polydipsia, polyuria, symptoms of hypoglycemia (dizziness, hunger, sweating) Hematologic:  Negative for anemia, purpura, petechia, prolonged or excessive bleeding, use of anticoagulants  Allergic:  Negative for difficulty breathing or choking as a result of exposure to anything; no shellfish allergy; no allergic response (rash/itch) to materials, foods  Physical exam: There were no vitals taken for this visit. GENERAL APPEARANCE:  Well appearing, well developed, well nourished, NAD HEENT: Atraumatic, Normocephalic. NECK:  Normal appearance LUNGS: Normal inspiratory and expiratory excursion HEART: Regular Rate ABDOMEN: ***. GU: Phallus normal, no lesions. Scrotal skin normal. Testicles/epididymal structures normal. Meatus normal. Normal anal sphincter tone, prostate ***mL, symmetric, non nodular, non tender. EXTREMITIES: Moves all extremities well.  Without clubbing, cyanosis, or edema. NEUROLOGIC:  Alert and oriented x 3, normal gait, CN II-XII grossly intact.  MENTAL STATUS:  Appropriate. SKIN:  Warm, dry and intact.    Results: No results found for this or any previous visit (from the past 24 hours).  I have reviewed referring/prior physicians notes  I have reviewed urinalysis  I have reviewed PSA results  I have reviewed prior imaging  I have reviewed urine culture results  Assessment: ***   Plan: ***

## 2024-03-11 ENCOUNTER — Ambulatory Visit: Payer: PRIVATE HEALTH INSURANCE | Admitting: Urology

## 2024-03-11 DIAGNOSIS — R972 Elevated prostate specific antigen [PSA]: Secondary | ICD-10-CM

## 2024-03-13 ENCOUNTER — Telehealth: Payer: Self-pay

## 2024-03-13 NOTE — Telephone Encounter (Signed)
 The pt states he had an episode like he was going to passed out. It lasted for two to three minutes. His breathing heavy and sweating badly. The pt is in [place] and not near his monitor. I told the pt he needs to go to the emergency room. They will be able to check his device there. I let him know unfortunately because he is not within 6 feet of his monitor we could not get a reading.

## 2024-03-27 ENCOUNTER — Ambulatory Visit: Payer: Self-pay | Admitting: Cardiology

## 2024-04-03 ENCOUNTER — Telehealth: Payer: Self-pay

## 2024-04-03 NOTE — Telephone Encounter (Signed)
 Copied from CRM [id number]. Topic: Clinical - Request for Lab/Test Order >> Apr 03, 2024 10:31 AM Isabell A wrote: Reason for CRM: Tanya Fantasia from [facility] cancelling patients CT scan for tomorrow - insurance company states it has been inactive since 12/15/23. Tanya Fantasia tried to get patients new insurance, no answer.  FYI

## 2024-04-04 ENCOUNTER — Ambulatory Visit: Payer: PRIVATE HEALTH INSURANCE

## 2024-04-10 NOTE — Telephone Encounter (Signed)
 Pt's insurance was updated & pt scheduled to 04/11/24.

## 2024-04-10 NOTE — Progress Notes (Incomplete)
 Chief Complaint: No chief complaint on file.   History of Present Illness:  [name] Fields is a 63 y.o. male who is seen in consultation from Early, [name], NP for evaluation of elevated PSA.  His PSA, checked in early April, was 8.9, 10% free fraction.   Past Medical History:  Past Medical History:  Diagnosis Date   Allergy to ACE inhibitors    Atrial tachycardia (HCC)    Cardiomyopathy, nonischemic (HCC)    GERD (gastroesophageal reflux disease)    Hiatal hernia    High-grade second-degree heart block     Qualifier: Diagnosis of  By: [name], MD, [name]    Hypertension    Permanent pacemaker-St. Jude     DOI 1999; gen re in the summer he has o pain syndrome BP 135% at time long so is his we have been short of his device is in a in a in a Foley disease since the stents removed about 3.0-4 seconds on the episode the patient is very 50 and a [place]  when his pacemaker but when he and his placement 2011   Presence of permanent cardiac pacemaker    Sleep apnea    uses CPAP sometimes   WPW (Wolff-Parkinson-White syndrome)    s/pRFCA    Past Surgical History:  Past Surgical History:  Procedure Laterality Date   CARDIAC DEFIBRILLATOR PLACEMENT  2011   St Jude -- [employer] RF   CARPAL TUNNEL RELEASE Left 08/18/2020   Procedure: CARPAL TUNNEL RELEASE;  Surgeon: [name], MD;  Location: [facility];  Service: Orthopedics;  Laterality: Left;  IV REGIONAL FOREARM BLOCK   CARPAL TUNNEL RELEASE Right 12/01/2020   Procedure: CARPAL TUNNEL RELEASE;  Surgeon: [name], MD;  Location: [facility];  Service: Orthopedics;  Laterality: Right;  IV REGIONAL FOREARM BLOCK   PACEMAKER INSERTION  1999   explanted 2011   PPM GENERATOR CHANGEOUT N/A 07/07/2021   Procedure: PPM GENERATOR CHANGEOUT;  Surgeon: [name], MD;  Location: [facility] INVASIVE CV LAB;  Service: Cardiovascular;  Laterality: N/A;    Allergies:  Allergies  Allergen Reactions    Lisinopril  Swelling    Lip swelling    Family History:  Family History  Problem Relation Age of Onset   Breast cancer Mother    Heart disease Mother    Heart Problems Mother        pacemaker   Prostate cancer Father    Hypertension Father     Social History:  Social History   Tobacco Use   Smoking status: Every Day    Current packs/day: 0.50    Average packs/day: 0.5 packs/day for 47.4 years (23.7 ttl pk-yrs)    Types: Cigarettes    Start date: 1978   Smokeless tobacco: Never   Tobacco comments:    Pt has stopped and satated back  Vaping Use   Vaping status: Never Used  Substance Use Topics   Alcohol use: Yes    Alcohol/week: 7.0 standard drinks of alcohol    Types: 7 Standard drinks or equivalent per week    Comment: social   Drug use: No    Review of symptoms:  Constitutional:  Negative for unexplained weight loss, night sweats, fever, chills ENT:  Negative for nose bleeds, sinus pain, painful swallowing CV:  Negative for chest pain, shortness of breath, exercise intolerance, palpitations, loss of consciousness Resp:  Negative for cough, wheezing, shortness of breath GI:  Negative for nausea, vomiting, diarrhea,  bloody stools GU:  Positives noted in HPI; otherwise negative for gross hematuria, dysuria, urinary incontinence Neuro:  Negative for seizures, poor balance, limb weakness, slurred speech Psych:  Negative for lack of energy, depression, anxiety Endocrine:  Negative for polydipsia, polyuria, symptoms of hypoglycemia (dizziness, hunger, sweating) Hematologic:  Negative for anemia, purpura, petechia, prolonged or excessive bleeding, use of anticoagulants  Allergic:  Negative for difficulty breathing or choking as a result of exposure to anything; no shellfish allergy; no allergic response (rash/itch) to materials, foods  Physical exam: There were no vitals taken for this visit. GENERAL APPEARANCE:  Well appearing, well developed, well nourished, NAD HEENT:  Atraumatic, Normocephalic. NECK: Normal appearance LUNGS: Normal inspiratory and expiratory excursion HEART: Regular Rate ABDOMEN: ***. GU: Phallus normal, no lesions. Scrotal skin normal. Testicles/epididymal structures normal. Meatus normal. Normal anal sphincter tone, prostate ***mL, symmetric, non nodular, non tender. EXTREMITIES: Moves all extremities well.  Without clubbing, cyanosis, or edema. NEUROLOGIC:  Alert and oriented x 3, normal gait, CN II-XII grossly intact.  MENTAL STATUS:  Appropriate. SKIN:  Warm, dry and intact.    Results:   I have reviewed referring/prior physicians notes  I have reviewed urinalysis  I have reviewed PSA results  I have reviewed prior imaging--CT abdomen and pelvis from 2018.  At that time, estimated prostate volume 60 mL.  Post recent serum chemistries and CBC reviewed-all normal.  Assessment: Elevated PSA   Plan: ***

## 2024-04-15 ENCOUNTER — Inpatient Hospital Stay: Admission: RE | Admit: 2024-04-15 | Source: Ambulatory Visit

## 2024-04-15 ENCOUNTER — Ambulatory Visit: Payer: Self-pay | Admitting: Urology

## 2024-05-01 ENCOUNTER — Ambulatory Visit (INDEPENDENT_AMBULATORY_CARE_PROVIDER_SITE_OTHER): Payer: Self-pay

## 2024-05-01 DIAGNOSIS — I441 Atrioventricular block, second degree: Secondary | ICD-10-CM | POA: Diagnosis not present

## 2024-05-06 LAB — CUP PACEART REMOTE DEVICE CHECK
Battery Remaining Longevity: 87 mo
Battery Remaining Percentage: 78 %
Battery Voltage: 3.01 V
Brady Statistic AP VP Percent: 1.8 %
Brady Statistic AP VS Percent: 2.2 %
Brady Statistic AS VP Percent: 12 %
Brady Statistic AS VS Percent: 84 %
Brady Statistic RA Percent Paced: 3.4 %
Brady Statistic RV Percent Paced: 14 %
Date Time Interrogation Session: 20250702195402
Implantable Lead Connection Status: 753985
Implantable Lead Connection Status: 753985
Implantable Lead Implant Date: 19990719
Implantable Lead Implant Date: 19990719
Implantable Lead Location: 753859
Implantable Lead Location: 753860
Implantable Pulse Generator Implant Date: 20220907
Lead Channel Impedance Value: 340 Ohm
Lead Channel Impedance Value: 380 Ohm
Lead Channel Pacing Threshold Amplitude: 0.5 V
Lead Channel Pacing Threshold Amplitude: 0.75 V
Lead Channel Pacing Threshold Pulse Width: 0.5 ms
Lead Channel Pacing Threshold Pulse Width: 0.5 ms
Lead Channel Sensing Intrinsic Amplitude: 4.3 mV
Lead Channel Sensing Intrinsic Amplitude: 8.7 mV
Lead Channel Setting Pacing Amplitude: 2 V
Lead Channel Setting Pacing Amplitude: 2.5 V
Lead Channel Setting Pacing Pulse Width: 0.5 ms
Lead Channel Setting Sensing Sensitivity: 2 mV
Pulse Gen Model: 2272
Pulse Gen Serial Number: 6519619

## 2024-06-23 ENCOUNTER — Emergency Department (HOSPITAL_COMMUNITY)

## 2024-06-23 ENCOUNTER — Observation Stay (HOSPITAL_COMMUNITY)
Admission: EM | Admit: 2024-06-23 | Discharge: 2024-06-25 | Disposition: A | Discharge status: Home / Self Care | Attending: Student | Admitting: Student

## 2024-06-23 ENCOUNTER — Encounter (HOSPITAL_COMMUNITY): Payer: Self-pay

## 2024-06-23 ENCOUNTER — Other Ambulatory Visit: Payer: Self-pay

## 2024-06-23 DIAGNOSIS — F17210 Nicotine dependence, cigarettes, uncomplicated: Secondary | ICD-10-CM | POA: Diagnosis not present

## 2024-06-23 DIAGNOSIS — G4733 Obstructive sleep apnea (adult) (pediatric): Secondary | ICD-10-CM | POA: Insufficient documentation

## 2024-06-23 DIAGNOSIS — G5603 Carpal tunnel syndrome, bilateral upper limbs: Secondary | ICD-10-CM | POA: Diagnosis present

## 2024-06-23 DIAGNOSIS — Z720 Tobacco use: Secondary | ICD-10-CM | POA: Diagnosis present

## 2024-06-23 DIAGNOSIS — I10 Essential (primary) hypertension: Secondary | ICD-10-CM | POA: Insufficient documentation

## 2024-06-23 DIAGNOSIS — I456 Pre-excitation syndrome: Secondary | ICD-10-CM | POA: Diagnosis not present

## 2024-06-23 DIAGNOSIS — R569 Unspecified convulsions: Secondary | ICD-10-CM | POA: Diagnosis not present

## 2024-06-23 DIAGNOSIS — M5412 Radiculopathy, cervical region: Secondary | ICD-10-CM | POA: Insufficient documentation

## 2024-06-23 DIAGNOSIS — Z981 Arthrodesis status: Secondary | ICD-10-CM | POA: Diagnosis not present

## 2024-06-23 DIAGNOSIS — R200 Anesthesia of skin: Secondary | ICD-10-CM | POA: Diagnosis present

## 2024-06-23 DIAGNOSIS — M4802 Spinal stenosis, cervical region: Secondary | ICD-10-CM | POA: Diagnosis not present

## 2024-06-23 DIAGNOSIS — Z950 Presence of cardiac pacemaker: Secondary | ICD-10-CM | POA: Diagnosis present

## 2024-06-23 DIAGNOSIS — M5090 Cervical disc disorder, unspecified, unspecified cervical region: Secondary | ICD-10-CM

## 2024-06-23 DIAGNOSIS — Z79899 Other long term (current) drug therapy: Secondary | ICD-10-CM | POA: Diagnosis not present

## 2024-06-23 DIAGNOSIS — Z6830 Body mass index (BMI) 30.0-30.9, adult: Secondary | ICD-10-CM | POA: Insufficient documentation

## 2024-06-23 DIAGNOSIS — E66811 Obesity, class 1: Secondary | ICD-10-CM | POA: Insufficient documentation

## 2024-06-23 DIAGNOSIS — R202 Paresthesia of skin: Secondary | ICD-10-CM | POA: Diagnosis not present

## 2024-06-23 DIAGNOSIS — R911 Solitary pulmonary nodule: Secondary | ICD-10-CM | POA: Insufficient documentation

## 2024-06-23 DIAGNOSIS — R531 Weakness: Principal | ICD-10-CM | POA: Insufficient documentation

## 2024-06-23 LAB — CBC
HCT: 45.7 % (ref 39.0–52.0)
Hemoglobin: 15.2 g/dL (ref 13.0–17.0)
MCH: 29.7 pg (ref 26.0–34.0)
MCHC: 33.3 g/dL (ref 30.0–36.0)
MCV: 89.3 fL (ref 80.0–100.0)
Platelets: 294 K/uL (ref 150–400)
RBC: 5.12 MIL/uL (ref 4.22–5.81)
RDW: 13.6 % (ref 11.5–15.5)
WBC: 8.6 K/uL (ref 4.0–10.5)
nRBC: 0 % (ref 0.0–0.2)

## 2024-06-23 LAB — COMPREHENSIVE METABOLIC PANEL WITH GFR
ALT: 22 U/L (ref 0–44)
AST: 30 U/L (ref 15–41)
Albumin: 3.4 g/dL — ABNORMAL LOW (ref 3.5–5.0)
Alkaline Phosphatase: 103 U/L (ref 38–126)
Anion gap: 9 (ref 5–15)
BUN: 12 mg/dL (ref 8–23)
CO2: 22 mmol/L (ref 22–32)
Calcium: 9.2 mg/dL (ref 8.9–10.3)
Chloride: 105 mmol/L (ref 98–111)
Creatinine, Ser: 1.04 mg/dL (ref 0.61–1.24)
GFR, Estimated: >60 mL/min (ref >60)
Glucose, Bld: 92 mg/dL (ref 70–99)
Potassium: 3.9 mmol/L (ref 3.5–5.1)
Sodium: 136 mmol/L (ref 135–145)
Total Bilirubin: 0.6 mg/dL (ref 0.0–1.2)
Total Protein: 6.9 g/dL (ref 6.5–8.1)

## 2024-06-23 LAB — DIFFERENTIAL
Basophils Absolute: 0.2 K/uL — ABNORMAL HIGH (ref 0.0–0.1)
Basophils Relative: 2 %
Eosinophils Absolute: 0.3 K/uL (ref 0.0–0.5)
Eosinophils Relative: 4 %
Lymphocytes Relative: 51 %
Lymphs Abs: 4.4 K/uL — ABNORMAL HIGH (ref 0.7–4.0)
Monocytes Absolute: 0.3 K/uL (ref 0.1–1.0)
Monocytes Relative: 3 %
Neutro Abs: 3.4 K/uL (ref 1.7–7.7)
Neutrophils Relative %: 40 %

## 2024-06-23 LAB — ETHANOL: Alcohol, Ethyl (B): <15 mg/dL (ref <15)

## 2024-06-23 LAB — PROTIME-INR
INR: 1 (ref 0.8–1.2)
Prothrombin Time: 13.3 s (ref 11.4–15.2)

## 2024-06-23 LAB — CBG MONITORING, ED: Glucose-Capillary: 100 mg/dL — ABNORMAL HIGH (ref 70–99)

## 2024-06-23 LAB — I-STAT CHEM 8, ED
BUN: 13 mg/dL (ref 8–23)
Calcium, Ion: 1.13 mmol/L — ABNORMAL LOW (ref 1.15–1.40)
Chloride: 104 mmol/L (ref 98–111)
Creatinine, Ser: 1 mg/dL (ref 0.61–1.24)
Glucose, Bld: 93 mg/dL (ref 70–99)
HCT: 47 % (ref 39.0–52.0)
Hemoglobin: 16 g/dL (ref 13.0–17.0)
Potassium: 3.7 mmol/L (ref 3.5–5.1)
Sodium: 139 mmol/L (ref 135–145)
TCO2: 21 mmol/L — ABNORMAL LOW (ref 22–32)

## 2024-06-23 LAB — APTT: aPTT: 28 s (ref 24–36)

## 2024-06-23 NOTE — ED Provider Notes (Signed)
 [place] EMERGENCY DEPARTMENT AT [facility] Provider Note   CSN: [id number] Arrival date & time: 06/23/24  2002     Patient presents with: Weakness (Right sided /)   [name] Fields is a 63 y.o. male past medical history significant for GERD, WPW, hypertension presents today for intermittent right arm and right leg numbness and weakness.  Patient only reports decreased sensation in his right palm at this time.  Patient denies back injury, facial droop, loss of bowel or bladder control, saddle anesthesia, chest pain, shortness of breath, fever, chills, any other complaints at this time.    Weakness      Prior to Admission medications   Medication Sig Start Date End Date Taking? Authorizing Provider  Ascorbic Acid (VITAMIN C) 1000 MG tablet Take 1,000 mg by mouth daily.   Yes [provider]  hydrochlorothiazide  (HYDRODIURIL ) 25 MG tablet Take 1 tablet (25 mg total) by mouth daily. 01/31/24  Yes Early, Sara E, NP  ibuprofen (ADVIL) 200 MG tablet Take 600 mg by mouth every 6 (six) hours as needed for mild pain (pain score 1-3) or moderate pain (pain score 4-6).   Yes [provider]  Multiple Vitamin (MULTIVITAMIN WITH MINERALS) TABS tablet Take 1 tablet by mouth daily.   Yes [provider]  naphazoline-glycerin (CLEAR EYES REDNESS RELIEF) 0.012-0.25 % SOLN Place 1-2 drops into both eyes 4 (four) times daily as needed for eye irritation.   Yes [provider]  Vitamin D , Ergocalciferol , (DRISDOL ) 1.25 MG (50000 UNIT) CAPS capsule Take 1 capsule (50,000 Units total) by mouth every 7 (seven) days. 01/31/24  Yes Early, Camie BRAVO, NP    Allergies: Lisinopril     Review of Systems  Neurological:  Positive for weakness and numbness.    Updated Vital Signs BP (!) 163/73   Pulse 65   Temp 98.6 F (37 C)   Resp 18   Ht 6' (1.829 m)   Wt 101 kg   SpO2 99%   BMI 30.20 kg/m   Physical Exam Vitals and nursing note reviewed.  Constitutional:       General: He is not in acute distress.    Appearance: Normal appearance. He is well-developed. He is not ill-appearing.  HENT:     Head: Normocephalic and atraumatic.     Right Ear: External ear normal.     Left Ear: External ear normal.     Nose: Nose normal.  Eyes:     General: No visual field deficit.    Conjunctiva/sclera: Conjunctivae normal.  Cardiovascular:     Rate and Rhythm: Normal rate and regular rhythm.     Pulses: Normal pulses.     Heart sounds: Normal heart sounds. No murmur heard. Pulmonary:     Effort: Pulmonary effort is normal. No respiratory distress.     Breath sounds: Normal breath sounds.  Abdominal:     Palpations: Abdomen is soft.     Tenderness: There is no abdominal tenderness.  Musculoskeletal:        General: No swelling.     Cervical back: Neck supple.     Comments: Equal bilateral grip strength, patient able to straight leg lift bilateral legs without issue.  Patient able to flex and extend elbows against resistance and shrug shoulders against resistance.  Skin:    General: Skin is warm and dry.     Capillary Refill: Capillary refill takes less than 2 seconds.  Neurological:     General: No focal deficit present.  Mental Status: He is alert and oriented to person, place, and time.     GCS: GCS eye subscore is 4. GCS verbal subscore is 5. GCS motor subscore is 6.     Cranial Nerves: No cranial nerve deficit, dysarthria or facial asymmetry.     Sensory: Sensory deficit present.     Motor: No weakness, tremor or pronator drift.     Coordination: Coordination normal. Finger-Nose-Finger Test and Heel to Shin Test normal.     Comments: Patient reports decrease sensation in his right palm when compared to left  Psychiatric:        Mood and Affect: Mood normal.     (all labs ordered are listed, but only abnormal results are displayed) Labs Reviewed  DIFFERENTIAL - Abnormal; Notable for the following components:      Result Value   Lymphs Abs  4.4 (*)    Basophils Absolute 0.2 (*)    All other components within normal limits  COMPREHENSIVE METABOLIC PANEL WITH GFR - Abnormal; Notable for the following components:   Albumin 3.4 (*)    All other components within normal limits  I-STAT CHEM 8, ED - Abnormal; Notable for the following components:   Calcium, Ion 1.13 (*)    TCO2 21 (*)    All other components within normal limits  CBG MONITORING, ED - Abnormal; Notable for the following components:   Glucose-Capillary 100 (*)    All other components within normal limits  PROTIME-INR  APTT  CBC  ETHANOL    EKG: None  Radiology: CT HEAD WO CONTRAST Result Date: 06/23/2024 CLINICAL DATA:  Neuro deficit, acute, stroke suspected EXAM: CT HEAD WITHOUT CONTRAST TECHNIQUE: Contiguous axial images were obtained from the base of the skull through the vertex without intravenous contrast. RADIATION DOSE REDUCTION: This exam was performed according to the departmental dose-optimization program which includes automated exposure control, adjustment of the mA and/or kV according to patient size and/or use of iterative reconstruction technique. COMPARISON:  None Available. FINDINGS: Brain: No evidence of large-territorial acute infarction. No parenchymal hemorrhage. No mass lesion. No extra-axial collection. No mass effect or midline shift. No hydrocephalus. Basilar cisterns are patent. Vascular: No hyperdense vessel. Atherosclerotic calcifications are present within the cavernous internal carotid arteries. Skull: No acute fracture or focal lesion. Sinuses/Orbits: Paranasal sinuses and mastoid air cells are clear. The orbits are unremarkable. Other: None. IMPRESSION: No acute intracranial abnormality. Electronically Signed   By: Morgane  Naveau M.D.   On: 06/23/2024 21:52     Procedures   Medications Ordered in the ED - No data to display                                  Medical Decision Making Amount and/or Complexity of Data  Reviewed Labs: ordered. Radiology: ordered.   This patient presents to the ED for concern of intermittent right arm and leg numbness, this involves an extensive number of treatment options, and is a complaint that carries with it a high risk of complications and morbidity.  The differential diagnosis includes TIA, stroke, nerve impingement,   Co morbidities / Chronic conditions that complicate the patient evaluation  GERD, WPW, hypertension   Additional history obtained:  Additional history obtained from EMR External records from outside source obtained and reviewed including cardiology notes   Lab Tests:  I Ordered, and personally interpreted labs.  The pertinent results include: CMP with mildly reduced albumin at  3.4, CBC unremarkable, alcohol less than 15, pro time, INR, and APTT WNL   Imaging Studies ordered:  I ordered imaging studies including CT head Noncon I independently visualized and interpreted imaging which showed no acute intracranial abnormality I agree with the radiologist interpretation   Problem List / ED Course / Critical interventions / Medication management I ordered medication including CT Head noncon   Reevaluation of the patient after these medicines showed that the patient no acute intracranial abnormality. I have reviewed the patients home medicines and have made adjustments as needed   Consultations Obtained:  I requested consultation with the Neurology, Dr. Sallyann and discussed lab and imaging findings as well as pertinent plan - they recommend: Dr. Sallyann agreeable to consult on patient.  Neurology recommends myelogram and EEG, we do not have these resources currently at night.  Patient will be admitted for observation with neurology consultation inpatient. Consulted hospitalist, Dr. Charlton who is agreeable to admission   Test / Admission - Considered:  Admission     Final diagnoses:  Weakness  Numbness    ED Discharge Orders     None           Francis Ileana SAILOR, PA-C 06/24/24 0153    Tegeler, Lonni PARAS, MD 06/24/24 [phone]

## 2024-06-23 NOTE — ED Provider Notes (Incomplete)
  EMERGENCY DEPARTMENT AT  HOSPITAL Provider Note   CSN: [id number] Arrival date & time: 06/23/24  2002     Patient presents with: Weakness (Right sided /)   [name] Fields is a 63 y.o. male past medical history significant for GERD, WPW, hypertension presents today for intermittent right arm and right leg numbness and weakness.  Patient only reports decreased sensation in his right palm at this time.  Patient denies back injury, facial droop, loss of bowel or bladder control, saddle anesthesia, chest pain, shortness of breath, fever, chills, any other complaints at this time.  {Add pertinent medical, surgical, social history, OB history to HPI:32947}  Weakness      Prior to Admission medications   Medication Sig Start Date End Date Taking? Authorizing Provider  Ascorbic Acid (VITAMIN C) 1000 MG tablet Take 1,000 mg by mouth daily.    Provider, Historical, Fields  hydrochlorothiazide  (HYDRODIURIL ) 25 MG tablet Take 1 tablet (25 mg total) by mouth daily. 01/31/24   [name] Fields, [name] Fields  metoprolol  tartrate (LOPRESSOR ) 100 MG tablet Take one tablet by mouth 90-120 minutes prior to your cardiac CT. 02/06/24   [name] Fields  Vitamin D , Ergocalciferol , (DRISDOL ) 1.25 MG (50000 UNIT) CAPS capsule Take 1 capsule (50,000 Units total) by mouth every 7 (seven) days. 01/31/24   [name] Fields    Allergies: Lisinopril     Review of Systems  Neurological:  Positive for weakness and numbness.    Updated Vital Signs BP (!) 160/96 (BP Location: Right Arm)   Pulse 73   Temp 98.6 F (37 C)   Resp 16   Ht 6' (1.829 m)   Wt 101 kg   SpO2 99%   BMI 30.20 kg/m   Physical Exam Vitals and nursing note reviewed.  Constitutional:      General: He is not in acute distress.    Appearance: Normal appearance. He is well-developed. He is not ill-appearing.  HENT:     Head: Normocephalic and atraumatic.     Right Ear: External ear normal.     Left Ear: External ear normal.      Nose: Nose normal.  Eyes:     General: No visual field deficit.    Conjunctiva/sclera: Conjunctivae normal.  Cardiovascular:     Rate and Rhythm: Normal rate and regular rhythm.     Pulses: Normal pulses.     Heart sounds: Normal heart sounds. No murmur heard. Pulmonary:     Effort: Pulmonary effort is normal. No respiratory distress.     Breath sounds: Normal breath sounds.  Abdominal:     Palpations: Abdomen is soft.     Tenderness: There is no abdominal tenderness.  Musculoskeletal:        General: No swelling.     Cervical back: Neck supple.     Comments: Equal bilateral grip strength, patient able to straight leg lift bilateral legs without issue.  Patient able to flex and extend elbows against resistance and shrug shoulders against resistance.  Skin:    General: Skin is warm and dry.     Capillary Refill: Capillary refill takes less than 2 seconds.  Neurological:     General: No focal deficit present.     Mental Status: He is alert and oriented to person, place, and time.     GCS: GCS eye subscore is 4. GCS verbal subscore is 5. GCS motor subscore is 6.     Cranial Nerves: No cranial nerve deficit,  dysarthria or facial asymmetry.     Sensory: Sensory deficit present.     Motor: No weakness, tremor or pronator drift.     Coordination: Coordination normal. Finger-Nose-Finger Test and Heel to Shin Test normal.     Comments: Patient reports decrease sensation in his right palm when compared to left  Psychiatric:        Mood and Affect: Mood normal.     (all labs ordered are listed, but only abnormal results are displayed) Labs Reviewed  DIFFERENTIAL - Abnormal; Notable for the following components:      Result Value   Lymphs Abs 4.4 (*)    Basophils Absolute 0.2 (*)    All other components within normal limits  COMPREHENSIVE METABOLIC PANEL WITH GFR - Abnormal; Notable for the following components:   Albumin 3.4 (*)    All other components within normal limits  I-STAT  CHEM 8, ED - Abnormal; Notable for the following components:   Calcium, Ion 1.13 (*)    TCO2 21 (*)    All other components within normal limits  CBG MONITORING, ED - Abnormal; Notable for the following components:   Glucose-Capillary 100 (*)    All other components within normal limits  PROTIME-INR  APTT  CBC  ETHANOL    EKG: None  Radiology: CT HEAD WO CONTRAST Result Date: 06/23/2024 CLINICAL DATA:  Neuro deficit, acute, stroke suspected EXAM: CT HEAD WITHOUT CONTRAST TECHNIQUE: Contiguous axial images were obtained from the base of the skull through the vertex without intravenous contrast. RADIATION DOSE REDUCTION: This exam was performed according to the departmental dose-optimization program which includes automated exposure control, adjustment of the mA and/or kV according to patient size and/or use of iterative reconstruction technique. COMPARISON:  None Available. FINDINGS: Brain: No evidence of large-territorial acute infarction. No parenchymal hemorrhage. No mass lesion. No extra-axial collection. No mass effect or midline shift. No hydrocephalus. Basilar cisterns are patent. Vascular: No hyperdense vessel. Atherosclerotic calcifications are present within the cavernous internal carotid arteries. Skull: No acute fracture or focal lesion. Sinuses/Orbits: Paranasal sinuses and mastoid air cells are clear. The orbits are unremarkable. Other: None. IMPRESSION: No acute intracranial abnormality. Electronically Signed   By: [name]  Fields M.D.   On: 06/23/2024 21:52    {Document cardiac monitor, telemetry assessment procedure when appropriate:32947} Procedures   Medications Ordered in the ED - No data to display    {Click here for ABCD2, HEART and other calculators REFRESH Note before signing:1}                              Medical Decision Making Amount and/or Complexity of Data Reviewed Labs: ordered. Radiology: ordered.   This patient presents to the ED for concern of  intermittent right arm and leg numbness, this involves an extensive number of treatment options, and is a complaint that carries with it a high risk of complications and morbidity.  The differential diagnosis includes TIA, stroke, nerve impingement,   Co morbidities / Chronic conditions that complicate the patient evaluation  GERD, WPW, hypertension   Additional history obtained:  Additional history obtained from EMR External records from outside source obtained and reviewed including cardiology notes   Lab Tests:  I Ordered, and personally interpreted labs.  The pertinent results include: CMP with mildly reduced albumin at 3.4, CBC unremarkable, alcohol less than 15, pro time, INR, and APTT WNL   Imaging Studies ordered:  I ordered imaging studies including  CT head Noncon I independently visualized and interpreted imaging which showed no acute intracranial abnormality I agree with the radiologist interpretation   Cardiac Monitoring: / EKG:  The patient was maintained on a cardiac monitor.  I personally viewed and interpreted the cardiac monitored which showed an underlying rhythm of: ***   Problem List / ED Course / Critical interventions / Medication management I ordered medication including CT Head noncon   Reevaluation of the patient after these medicines showed that the patient no acute intracranial abnormality. I have reviewed the patients home medicines and have made adjustments as needed   Consultations Obtained:  I requested consultation with the Neurology, Dr. Sallyann and discussed lab and imaging findings as well as pertinent plan - they recommend: Dr. Sallyann agreeable to consult on patient.   Test / Admission - Considered:  ***   {Document critical care time when appropriate  Document review of labs and clinical decision tools ie CHADS2VASC2, etc  Document your independent review of radiology images and any outside records  Document your discussion with family  members, caretakers and with consultants  Document social determinants of health affecting pt's care  Document your decision making why or why not admission, treatments were needed:32947:::1}   Final diagnoses:  None    ED Discharge Orders     None
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
## 2024-06-23 NOTE — ED Notes (Signed)
 Patient transported to CT

## 2024-06-24 ENCOUNTER — Observation Stay (HOSPITAL_COMMUNITY)

## 2024-06-24 ENCOUNTER — Encounter (HOSPITAL_COMMUNITY): Payer: Self-pay | Admitting: Family Medicine

## 2024-06-24 DIAGNOSIS — M545 Low back pain, unspecified: Secondary | ICD-10-CM

## 2024-06-24 DIAGNOSIS — G4733 Obstructive sleep apnea (adult) (pediatric): Secondary | ICD-10-CM | POA: Diagnosis not present

## 2024-06-24 DIAGNOSIS — R569 Unspecified convulsions: Secondary | ICD-10-CM | POA: Diagnosis not present

## 2024-06-24 DIAGNOSIS — R531 Weakness: Secondary | ICD-10-CM | POA: Diagnosis not present

## 2024-06-24 DIAGNOSIS — I10 Essential (primary) hypertension: Secondary | ICD-10-CM | POA: Diagnosis not present

## 2024-06-24 DIAGNOSIS — R200 Anesthesia of skin: Secondary | ICD-10-CM

## 2024-06-24 LAB — HIV ANTIBODY (ROUTINE TESTING W REFLEX): HIV Screen 4th Generation wRfx: NONREACTIVE

## 2024-06-24 LAB — BASIC METABOLIC PANEL WITH GFR
Anion gap: 4 — ABNORMAL LOW (ref 5–15)
BUN: 10 mg/dL (ref 8–23)
CO2: 25 mmol/L (ref 22–32)
Calcium: 8.9 mg/dL (ref 8.9–10.3)
Chloride: 107 mmol/L (ref 98–111)
Creatinine, Ser: 0.97 mg/dL (ref 0.61–1.24)
GFR, Estimated: >60 mL/min (ref >60)
Glucose, Bld: 90 mg/dL (ref 70–99)
Potassium: 3.7 mmol/L (ref 3.5–5.1)
Sodium: 136 mmol/L (ref 135–145)

## 2024-06-24 LAB — CBC
HCT: 43 % (ref 39.0–52.0)
Hemoglobin: 14.6 g/dL (ref 13.0–17.0)
MCH: 30.1 pg (ref 26.0–34.0)
MCHC: 34 g/dL (ref 30.0–36.0)
MCV: 88.7 fL (ref 80.0–100.0)
Platelets: 347 K/uL (ref 150–400)
RBC: 4.85 MIL/uL (ref 4.22–5.81)
RDW: 13.8 % (ref 11.5–15.5)
WBC: 7.2 K/uL (ref 4.0–10.5)
nRBC: 0 % (ref 0.0–0.2)

## 2024-06-24 MED ORDER — LIDOCAINE HCL (PF) 1 % IJ SOLN
5.0000 mL | Freq: Once | INTRAMUSCULAR | Status: AC
  Administered 2024-06-24: 10 mL via INTRADERMAL

## 2024-06-24 MED ORDER — ONDANSETRON HCL 4 MG/2ML IJ SOLN: 4.0000 mg | Freq: Four times a day (QID) | INTRAMUSCULAR | Status: DC | PRN

## 2024-06-24 MED ORDER — ACETAMINOPHEN 325 MG PO TABS: 650.0000 mg | ORAL_TABLET | Freq: Four times a day (QID) | ORAL | Status: DC | PRN

## 2024-06-24 MED ORDER — POLYETHYLENE GLYCOL 3350 17 G PO PACK: 17.0000 g | PACK | Freq: Every day | ORAL | Status: DC | PRN

## 2024-06-24 MED ORDER — HYDROCHLOROTHIAZIDE 25 MG PO TABS
25.0000 mg | ORAL_TABLET | Freq: Every day | ORAL | Status: DC
  Administered 2024-06-24 – 2024-06-25 (×2): 25 mg via ORAL
  Filled 2024-06-24 (×2): qty 1

## 2024-06-24 MED ORDER — OXYCODONE HCL 5 MG PO TABS
5.0000 mg | ORAL_TABLET | ORAL | Status: DC | PRN
  Administered 2024-06-24: 5 mg via ORAL
  Administered 2024-06-24: 10 mg via ORAL
  Filled 2024-06-24: qty 1
  Filled 2024-06-24: qty 2

## 2024-06-24 MED ORDER — HYDRALAZINE HCL 25 MG PO TABS: 25.0000 mg | ORAL_TABLET | Freq: Four times a day (QID) | ORAL | Status: DC | PRN

## 2024-06-24 MED ORDER — SODIUM CHLORIDE 0.9% FLUSH
3.0000 mL | Freq: Two times a day (BID) | INTRAVENOUS | Status: DC
  Administered 2024-06-24 – 2024-06-25 (×3): 3 mL via INTRAVENOUS

## 2024-06-24 MED ORDER — IOHEXOL 300 MG/ML  SOLN
10.0000 mL | Freq: Once | INTRAMUSCULAR | Status: AC | PRN
  Administered 2024-06-24: 10 mL via INTRATHECAL

## 2024-06-24 MED ORDER — ACETAMINOPHEN 650 MG RE SUPP: 650.0000 mg | Freq: Four times a day (QID) | RECTAL | Status: DC | PRN

## 2024-06-24 MED ORDER — ONDANSETRON HCL 4 MG PO TABS
4.0000 mg | ORAL_TABLET | Freq: Four times a day (QID) | ORAL | Status: DC | PRN
  Administered 2024-06-24: 4 mg via ORAL
  Filled 2024-06-24: qty 1

## 2024-06-24 NOTE — Plan of Care (Signed)

## 2024-06-24 NOTE — Progress Notes (Signed)
 NEUROLOGY CONSULT FOLLOW UP NOTE   Date of service: June 24, 2024 Patient Name: [name] Fields MRN:  [id number] DOB:  09/25/1961  Interval Hx/subjective   EEG negative. Pending myelogram today with fluoroscopy.   Patient lying in bed, c/o lower back pain that has made his right leg numbness constant. States that he has has sciatica pain in the past, and that this feels similar.   Vitals   Vitals:   06/24/24 0139 06/24/24 0400 06/24/24 0500 06/24/24 0600  BP: (!) 163/73 (!) 144/83 (!) 157/86 137/77  Pulse: 65 68    Resp: 18 14 14 12   Temp:      SpO2: 99%   99%  Weight:      Height:         Body mass index is 30.2 kg/m.  Physical Exam   Constitutional: Appears well-developed and well-nourished.  Cardiovascular: Normal rate and regular rhythm.  Respiratory: Effort normal, non-labored breathing.   Neurologic Examination   Neuro: Mental Status: Patient is awake, alert, oriented to person, place, month, year, and situation. Patient is able to give a clear and coherent history. No signs of aphasia or neglect Cranial Nerves: II: Visual Fields are full. Pupils are equal, round, and reactive to light.   III,IV, VI: EOMI without ptosis or diploplia.  V: Facial sensation is symmetric to light touch  VII: Facial movement is symmetric.  VIII: hearing is intact to voice X: No dysarthria, no hoarseness XI: Shoulder shrug is symmetric. XII: tongue is midline without atrophy or fasciculations.  Motor: Tone is normal. Bulk is normal.  RLE: 4+/5 hip and knee (limited d/t lower back pain), 5/5 dorsal and plantar flexion Sensory: Sensation is symmetric to light touch in the arms and legs. Decreased pinprick sensation consistent with yesterday's exam in R palm, R thigh. NO decreased sensation noted in right arm.  Cerebellar: FNF and HKS are intact bilaterally   Medications  Current Facility-Administered Medications:    acetaminophen  (TYLENOL ) tablet 650 mg, 650 mg, Oral, Q6H  PRN **OR** acetaminophen  (TYLENOL ) suppository 650 mg, 650 mg, Rectal, Q6H PRN, Opyd, Timothy S, MD   hydrochlorothiazide  (HYDRODIURIL ) tablet 25 mg, 25 mg, Oral, Daily, Opyd, Timothy S, MD   ondansetron  (ZOFRAN ) tablet 4 mg, 4 mg, Oral, Q6H PRN **OR** ondansetron  (ZOFRAN ) injection 4 mg, 4 mg, Intravenous, Q6H PRN, Opyd, Timothy S, MD   oxyCODONE  (Oxy IR/ROXICODONE ) immediate release tablet 5-10 mg, 5-10 mg, Oral, Q4H PRN, Opyd, Timothy S, MD   polyethylene glycol (MIRALAX  / GLYCOLAX ) packet 17 g, 17 g, Oral, Daily PRN, Opyd, Timothy S, MD   sodium chloride  flush (NS) 0.9 % injection 3 mL, 3 mL, Intravenous, Q12H, Opyd, Timothy S, MD, 3 mL at 06/24/24 0443  Current Outpatient Medications:    Ascorbic Acid (VITAMIN C) 1000 MG tablet, Take 1,000 mg by mouth daily., Disp: , Rfl:    hydrochlorothiazide  (HYDRODIURIL ) 25 MG tablet, Take 1 tablet (25 mg total) by mouth daily., Disp: 90 tablet, Rfl: 3   ibuprofen (ADVIL) 200 MG tablet, Take 600 mg by mouth every 6 (six) hours as needed for mild pain (pain score 1-3) or moderate pain (pain score 4-6)., Disp: , Rfl:    Multiple Vitamin (MULTIVITAMIN WITH MINERALS) TABS tablet, Take 1 tablet by mouth daily., Disp: , Rfl:    naphazoline-glycerin (CLEAR EYES REDNESS RELIEF) 0.012-0.25 % SOLN, Place 1-2 drops into both eyes 4 (four) times daily as needed for eye irritation., Disp: , Rfl:    Vitamin D , Ergocalciferol , (DRISDOL )  1.25 MG (50000 UNIT) CAPS capsule, Take 1 capsule (50,000 Units total) by mouth every 7 (seven) days., Disp: 12 capsule, Rfl: 3  Labs and Diagnostic Imaging   CBC:  Recent Labs  Lab 06/23/24 2108 06/23/24 2113 06/24/24 0443  WBC 8.6  --  7.2  NEUTROABS 3.4  --   --   HGB 15.2 16.0 14.6  HCT 45.7 47.0 43.0  MCV 89.3  --  88.7  PLT 294  --  347    Basic Metabolic Panel:  Lab Results  Component Value Date   NA 136 06/24/2024   K 3.7 06/24/2024   CO2 25 06/24/2024   GLUCOSE 90 06/24/2024   BUN 10 06/24/2024   CREATININE  0.97 06/24/2024   CALCIUM 8.9 06/24/2024   GFRNONAA >60 06/24/2024   GFRAA 83 05/12/2020   Lipid Panel:  Lab Results  Component Value Date   LDLCALC 82 01/31/2024   HgbA1c:  Lab Results  Component Value Date   HGBA1C 5.9 (H) 01/31/2024   Urine Drug Screen: No results found for: LABOPIA, COCAINSCRNUR, LABBENZ, AMPHETMU, THCU, LABBARB  Alcohol Level     Component Value Date/Time   [facility] <15 06/23/2024 2108   INR  Lab Results  Component Value Date   INR 1.0 06/23/2024   APTT  Lab Results  Component Value Date   APTT 28 06/23/2024   AED levels: No results found for: PHENYTOIN, ZONISAMIDE, LAMOTRIGINE, LEVETIRACETA  CT Head without contrast(Personally reviewed): No acute abnormality.     CT myelogram lumbar spine (07/2021) Broad-based disc protrusion and advanced facet hypertrophy at L4-5 results in moderate central canal stenosis with right greater than left subarticular narrowing. 2. Severe foraminal narrowing bilaterally at L4-5 is worse right than left. 3. Moderate foraminal narrowing bilaterally at L5-S1, left greater than right. 4. Mild left subarticular and bilateral foraminal stenosis at L2-3. 5. Moderate foraminal narrowing bilaterally at L3-4 is worse on the left. 6. Aortic Atherosclerosis (ICD10-I70.0).   CT myelogram cervical spine: pending for today  CT myelogram cervical spine (09/2019) CLINICAL DATA:  Progressively worsening posterior neck pain radiating into both shoulders and arms with new bilateral thumb and second through fourth finger numbness. Prior cervical fusion.   1. Sequelae of prior C3-C6 anterior and posterior fusion without significant residual stenosis. 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 2. Unchanged advanced lower cervical and upper thoracic spondylosis as described above. Moderate spinal canal stenosis at C6-C7. Severe neuroforaminal stenosis on the right at C7-T1 and bilaterally at T1-T2. 3.  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   Assessment   [name] Fields is a 63 y.o. male with hx of C3-C6 fusion, bilateral carpal tunnel decompression, WPW (s/p MRI-incompatible pacemaker, 1999), HTN, OSA who presents with episodic right arm and leg weakness in the setting of right neck pain and persistent right limb numbness.   On exam, some mild weakness to RLE seen due to patient's lower back pain, decreased pinprick sensation consistent with yesterday's exam.   Routine EEG negative. Pending cervical myelogram.    Recommendations   - routine EEG pending - Cervical myelogram pending ______________________________________________________________________   Signed, Rocky JAYSON Likes, NP [facility]    Attending Neurohospitalist Addendum Patient seen and examined with APP/Resident. Agree with the history and physical as documented above. Agree with the plan as documented, which I helped formulate. I have edited the note above to reflect my full findings and recommendations. I have independently reviewed the chart, obtained history, review of systems and examined the  patient.I have personally reviewed pertinent head/neck/spine imaging (CT/MRI). Please feel free to call with any questions.  Prior to admission patient had 2 discrete episodes of severe right lower extremity weakness out of proportion to pain.  The weakness was so severe that he was unable to walk or get out of the car.  Today on exam he has numbness in his right lower extremity which is subacute and has 5 out of 5 strength throughout except for 4+ out of 5 in his right hip flexor.  Will follow-up CT myelogram and continue to follow.  -- Elida Ross, MD [facility] [phone]  If 7pm- 7am, please page neurology on call as listed in AMION.

## 2024-06-24 NOTE — Procedures (Signed)
 Patient Name: [name] Fields  MRN: [id number]  Epilepsy Attending: Arlin MALVA Krebs  Referring Physician/Provider: Francis Ileana LOISE DEVONNA  Date: 06/24/2024 Duration: 22.57 mins  Patient history: 63 y.o. male who presents with right arm and leg weakness and numbness. EEG to evaluate for seizure  Level of alertness: Awake  AEDs during EEG study: None  Technical aspects: This EEG study was done with scalp electrodes positioned according to the 10-20 International system of electrode placement. Electrical activity was reviewed with band pass filter of 1-70Hz , sensitivity of 7 uV/mm, display speed of 82mm/sec with a 60Hz  notched filter applied as appropriate. EEG data were recorded continuously and digitally stored.  Video monitoring was available and reviewed as appropriate.  Description: The posterior dominant rhythm consists of 9-10 Hz activity of moderate voltage (25-35 uV) seen predominantly in posterior head regions, symmetric and reactive to eye opening and eye closing. Hyperventilation and photic stimulation were not performed.     IMPRESSION: This study is within normal limits. No seizures or epileptiform discharges were seen throughout the recording.  A normal interictal EEG does not exclude the diagnosis of epilepsy.   [name] Fields O [name]

## 2024-06-24 NOTE — ED Notes (Signed)
 Transport at bedside to take pt upstairs. All belongings transported with pt

## 2024-06-24 NOTE — ED Notes (Signed)
 Hourly rounding complete. Pt alert or resting, no distress noted, offered toileting and diet as appropriate. Side rails up, call light within reach. Pt denies pain or further needs at this time.

## 2024-06-24 NOTE — TOC CM/SW Note (Signed)
 Transition of Care [facility]) - Inpatient Brief Assessment   Patient Details  Name: [name] Fields MRN: [id number] Date of Birth: [birth year]  Transition of Care [facility]) CM/SW Contact:    Lauraine FORBES Saa, LCSWA Phone Number: 06/24/2024, 2:40 PM   Clinical Narrative:  2:40 PM Per chart review, patient resides at home alone. Patient has a PCP and insurance. Patient does not have SNF or HH history. Patient has DME (CPAP) hsitroy with [employer]. Patient's preferred pharmacy's are [facility], [facility] [address], and [facility] [address] [place]. No TOC needs were identified at this time. TOC will continue to follow and be available to assist.  Transition of Care Asessment: Insurance and Status: Insurance coverage has been reviewed Patient has primary care physician: Yes Home environment has been reviewed: Private Residence Prior level of function:: N/A Prior/Current Home Services: No current home services Social Drivers of Health Review: SDOH reviewed no interventions necessary Readmission risk has been reviewed: Yes (Currently Observation Status) Transition of care needs: no transition of care needs at this time

## 2024-06-24 NOTE — Progress Notes (Signed)
 EEG complete - results pending

## 2024-06-24 NOTE — Progress Notes (Signed)
 PROGRESS NOTE  [name] Fields FMW:[id number] DOB: September 02, 1961   PCP: Oris Camie BRAVO, NP  Patient is from: Home.  Lives with his wife.  Independently ambulates at baseline.  DOA: 06/23/2024 LOS: 0  Chief complaints Chief Complaint  Patient presents with   Weakness    Right sided       Brief Narrative / Interim history: 63 year old M with PMH of OSA on CPAP, high-grade AVB s/p MRI incompatible PPM, WPW s/p ablation, HTN, bilateral carpal tunnel s/p release surgery, and prior C3-C6 fusion presenting with intermittent right arm and right leg numbness and weakness.  In ED, stable vitals.  Labs unrevealing.  CT head without acute finding.  Neurology consulted and recommended EEG and cervical myelogram.   Subjective: Seen and examined earlier this morning.  No major event this morning.  He stated that he had some numbness in his right arm when he had an EEG.  Currently denies numbness.  Denies bowel or bladder habit change.  Objective: Vitals:   06/24/24 0500 06/24/24 0600 06/24/24 0730 06/24/24 0800  BP: (!) 157/86 137/77 136/74 (!) 164/76  Pulse:   60   Resp: 14 12 12 15   Temp:   98.7 F (37.1 C)   SpO2:  99% 100%   Weight:      Height:        Examination:  GENERAL: No apparent distress.  Nontoxic. HEENT: MMM.  Vision and hearing grossly intact.  NECK: Supple.  No apparent JVD.  RESP:  No IWOB.  Fair aeration bilaterally. CVS:  RRR. Heart sounds normal.  ABD/GI/GU: BS+. Abd soft, NTND.  MSK/EXT:  Moves extremities. No apparent deformity. No edema.  SKIN: no apparent skin lesion or wound NEURO: AA.  Oriented appropriately.  Clear speech.  No facial asymmetry.  Motor 4+/5 in RUE, 4/5 in RLE and 5/5 on the left.  Light sensation grossly intact.  Diminished reflexes but symmetric.  No pronator drift.  Finger-to-nose intact. PSYCH: Calm. Normal affect.   Consultants:  Neurology  Procedures: None  Microbiology summarized: None  Assessment and plan: RUE and RLE  numbness and weakness: Intermittent suggesting cervical radiculopathy.  Patient with prior cervical fusion.  He has some weakness on the right side on exam.  Light sensation is actually symmetric and intact.  CT head without acute finding.  EEG normal.  Patient has MRI incompatible pacemaker.  -Cervical myelogram -Follow neurology recs.  History of high-grade AVB/WPW s/p ablation and MRI incompatible pacemaker.  OSA on CPAP - Continue nightly CPAP  Essential hypertension: BP slightly elevated - Continue home HCTZ - P.o. hydralazine  as needed  History of cervical fusion - Cervical myelogram as above  Tobacco use disorder: Reports smoking about half a pack a day.  Extensive counseling about the importance of smoking cessation and treatment options.  He is motivated to quit but worried to use nicotine patch because of the rumors he heard.  After discussion, he is willing to try and also use the quit line service.   Class I obesity Body mass index is 30.2 kg/m.           DVT prophylaxis:  SCDs Start: 06/24/24 0238  Code Status: Full code Family Communication: Updated patient's wife over the phone Level of care: Telemetry Medical Status is: Observation The patient remains OBS appropriate and will d/c before 2 midnights.   Final disposition: Home   35 minutes with more than 50% spent in reviewing records, counseling patient/family and coordinating care.   Sch Meds:  Scheduled Meds:  hydrochlorothiazide   25 mg Oral Daily   lidocaine  (PF)  5 mL Intradermal Once   sodium chloride  flush  3 mL Intravenous Q12H   Continuous Infusions: PRN Meds:.acetaminophen  **OR** acetaminophen , iohexol , ondansetron  **OR** ondansetron  (ZOFRAN ) IV, oxyCODONE , polyethylene glycol  Antimicrobials: Anti-infectives (From admission, onward)    None        I have personally reviewed the following labs and images: CBC: Recent Labs  Lab 06/23/24 2108 06/23/24 2113 06/24/24 0443  WBC  8.6  --  7.2  NEUTROABS 3.4  --   --   HGB 15.2 16.0 14.6  HCT 45.7 47.0 43.0  MCV 89.3  --  88.7  PLT 294  --  347   BMP &GFR Recent Labs  Lab 06/23/24 2108 06/23/24 2113 06/24/24 0627  NA 136 139 136  K 3.9 3.7 3.7  CL 105 104 107  CO2 22  --  25  GLUCOSE 92 93 90  BUN 12 13 10   CREATININE 1.04 1.00 0.97  CALCIUM 9.2  --  8.9   Estimated Creatinine Clearance: 97.2 mL/min (by C-G formula based on SCr of 0.97 mg/dL). Liver & Pancreas: Recent Labs  Lab 06/23/24 2108  AST 30  ALT 22  ALKPHOS 103  BILITOT 0.6  PROT 6.9  ALBUMIN 3.4*   No results for input(s): LIPASE, AMYLASE in the last 168 hours. No results for input(s): AMMONIA in the last 168 hours. Diabetic: No results for input(s): HGBA1C in the last 72 hours. Recent Labs  Lab 06/23/24 2105  GLUCAP 100*   Cardiac Enzymes: No results for input(s): CKTOTAL, CKMB, CKMBINDEX, TROPONINI in the last 168 hours. No results for input(s): PROBNP in the last 8760 hours. Coagulation Profile: Recent Labs  Lab 06/23/24 2108  INR 1.0   Thyroid Function Tests: No results for input(s): TSH, T4TOTAL, FREET4, T3FREE, THYROIDAB in the last 72 hours. Lipid Profile: No results for input(s): CHOL, HDL, LDLCALC, TRIG, CHOLHDL, LDLDIRECT in the last 72 hours. Anemia Panel: No results for input(s): VITAMINB12, FOLATE, FERRITIN, TIBC, IRON, RETICCTPCT in the last 72 hours. Urine analysis:    Component Value Date/Time   APPEARANCEUR Clear 08/29/2022 1101   GLUCOSEU Negative 08/29/2022 1101   BILIRUBINUR Negative 08/29/2022 1101   PROTEINUR Trace 08/29/2022 1101   NITRITE Negative 08/29/2022 1101   LEUKOCYTESUR Negative 08/29/2022 1101   Sepsis Labs: Invalid input(s): PROCALCITONIN, LACTICIDVEN  Microbiology: No results found for this or any previous visit (from the past 240 hours).  Radiology Studies: EEG adult Result Date: 06/24/2024 Shelton Arlin KIDD, MD      06/24/2024  8:59 AM Patient Name: [name] Fields MRN: [id number] Epilepsy Attending: Arlin KIDD Shelton Referring Physician/Provider: Francis Ileana LOISE DEVONNA Date: 06/24/2024 Duration: 22.57 mins Patient history: 63 y.o. male who presents with right arm and leg weakness and numbness. EEG to evaluate for seizure Level of alertness: Awake AEDs during EEG study: None Technical aspects: This EEG study was done with scalp electrodes positioned according to the 10-20 International system of electrode placement. Electrical activity was reviewed with band pass filter of 1-70Hz , sensitivity of 7 uV/mm, display speed of 20mm/sec with a 60Hz  notched filter applied as appropriate. EEG data were recorded continuously and digitally stored.  Video monitoring was available and reviewed as appropriate. Description: The posterior dominant rhythm consists of 9-10 Hz activity of moderate voltage (25-35 uV) seen predominantly in posterior head regions, symmetric and reactive to eye opening and eye closing. Hyperventilation and photic stimulation were not performed.  IMPRESSION: This study is within normal limits. No seizures or epileptiform discharges were seen throughout the recording. A normal interictal EEG does not exclude the diagnosis of epilepsy. Arlin MALVA Krebs   CT HEAD WO CONTRAST Result Date: 06/23/2024 CLINICAL DATA:  Neuro deficit, acute, stroke suspected EXAM: CT HEAD WITHOUT CONTRAST TECHNIQUE: Contiguous axial images were obtained from the base of the skull through the vertex without intravenous contrast. RADIATION DOSE REDUCTION: This exam was performed according to the departmental dose-optimization program which includes automated exposure control, adjustment of the mA and/or kV according to patient size and/or use of iterative reconstruction technique. COMPARISON:  None Available. FINDINGS: Brain: No evidence of large-territorial acute infarction. No parenchymal hemorrhage. No mass lesion. No extra-axial collection. No  mass effect or midline shift. No hydrocephalus. Basilar cisterns are patent. Vascular: No hyperdense vessel. Atherosclerotic calcifications are present within the cavernous internal carotid arteries. Skull: No acute fracture or focal lesion. Sinuses/Orbits: Paranasal sinuses and mastoid air cells are clear. The orbits are unremarkable. Other: None. IMPRESSION: No acute intracranial abnormality. Electronically Signed   By: Morgane  Naveau M.D.   On: 06/23/2024 21:52      Thamas Appleyard T. Teesha Ohm [facility]  If 7PM-7AM, please contact night-coverage [email or web address] 06/24/2024, 10:45 AM

## 2024-06-24 NOTE — Procedures (Signed)
 PROCEDURE SUMMARY:  Successful fluoroscopic guided cervical myelogram.  Myelogram performed at L3-L4.    No immediate complications.  Pt tolerated well.   EBL = none  Please see full dictation in imaging section of Epic for procedure details.  Electronically signed by: Lavanda JAYSON Jurist PA-C 06/24/2024 10:49 AM

## 2024-06-24 NOTE — H&P (Signed)
 History and Physical    [name] Fields FMW:[id number] DOB: 10/17/1961 DOA: 06/23/2024  PCP: Oris Camie BRAVO, NP   Patient coming from: Home   Chief Complaint: Right arm and right leg numbness and weakness   HPI: [name] Fields is a 63 y.o. male with medical history significant for hypertension, OSA on CPAP, high-grade heart block with pacemaker, WPW status post ablation, bilateral carpal tunnel release surgeries, and history of C3 C6 fusion who presents with right arm and right leg numbness with intermittent right arm and right leg weakness.  Patient describes a long history of chronic neck pain which has been worse at times recently.  He then had an episode on 06/21/2024 marked by severe right leg weakness and numbness.  Right arm was also numb at this time.  The weakness resolved after sitting for 15 to 20 minutes but the numbness persisted.  He had a second episode of right leg weakness yesterday which also lasted 15 to 20 minutes.  He denies headache, change in vision or hearing, fever, chills, chest pain, or palpitations.  ED Course: Upon arrival to the ED, patient is found to be afebrile and saturating well on room air with normal HR and elevated BP.  Labs are notable for normal creatinine, normal electrolytes, and normal WBC.  There are no acute findings on head CT.  Neurology evaluated the patient in the emergency department and has recommended EEG and cervical myelogram.  Review of Systems:  All other systems reviewed and apart from HPI, are negative.  Past Medical History:  Diagnosis Date   Allergy to ACE inhibitors    Atrial tachycardia (HCC)    Cardiomyopathy, nonischemic (HCC)    GERD (gastroesophageal reflux disease)    Hiatal hernia    High-grade second-degree heart block     Qualifier: Diagnosis of  By: Fernande, MD, CODY Elspeth Darner    Hypertension    Permanent pacemaker-St. Jude     DOI 1999; gen re in the summer he has o pain syndrome BP 135% at time long so is his we  have been short of his device is in a in a in a Foley disease since the stents removed about 3.0-4 seconds on the episode the patient is very 50 and a [place]  when his pacemaker but when he and his placement 2011   Presence of permanent cardiac pacemaker    Sleep apnea    uses CPAP sometimes   WPW (Wolff-Parkinson-White syndrome)    s/pRFCA    Past Surgical History:  Procedure Laterality Date   CARDIAC DEFIBRILLATOR PLACEMENT  2011   St Jude -- [employer] RF   CARPAL TUNNEL RELEASE Left 08/18/2020   Procedure: CARPAL TUNNEL RELEASE;  Surgeon: Murrell Kuba, MD;  Location: [facility];  Service: Orthopedics;  Laterality: Left;  IV REGIONAL FOREARM BLOCK   CARPAL TUNNEL RELEASE Right 12/01/2020   Procedure: CARPAL TUNNEL RELEASE;  Surgeon: Murrell Kuba, MD;  Location: [facility];  Service: Orthopedics;  Laterality: Right;  IV REGIONAL FOREARM BLOCK   PACEMAKER INSERTION  1999   explanted 2011   PPM GENERATOR CHANGEOUT N/A 07/07/2021   Procedure: PPM GENERATOR CHANGEOUT;  Surgeon: Fernande Elspeth BROCKS, MD;  Location: [facility];  Service: Cardiovascular;  Laterality: N/A;    Social History:   reports that he has been smoking cigarettes. He started smoking about 47 years ago. He has a 23.8 pack-year smoking history. He has never used smokeless tobacco. He reports current alcohol  use of about 7.0 standard drinks of alcohol per week. He reports that he does not use drugs.  Allergies  Allergen Reactions   Lisinopril  Swelling    Lip swelling    Family History  Problem Relation Age of Onset   Breast cancer Mother    Heart disease Mother    Heart Problems Mother        pacemaker   Prostate cancer Father    Hypertension Father      Prior to Admission medications   Medication Sig Start Date End Date Taking? Authorizing Provider  Ascorbic Acid (VITAMIN C) 1000 MG tablet Take 1,000 mg by mouth daily.   Yes [provider]  hydrochlorothiazide  (HYDRODIURIL )  25 MG tablet Take 1 tablet (25 mg total) by mouth daily. 01/31/24  Yes Early, Sara E, NP  ibuprofen (ADVIL) 200 MG tablet Take 600 mg by mouth every 6 (six) hours as needed for mild pain (pain score 1-3) or moderate pain (pain score 4-6).   Yes [provider]  Multiple Vitamin (MULTIVITAMIN WITH MINERALS) TABS tablet Take 1 tablet by mouth daily.   Yes [provider]  naphazoline-glycerin (CLEAR EYES REDNESS RELIEF) 0.012-0.25 % SOLN Place 1-2 drops into both eyes 4 (four) times daily as needed for eye irritation.   Yes [provider]  Vitamin D , Ergocalciferol , (DRISDOL ) 1.25 MG (50000 UNIT) CAPS capsule Take 1 capsule (50,000 Units total) by mouth every 7 (seven) days. 01/31/24  Yes EarlyCamie BRAVO, NP    Physical Exam: Vitals:   06/23/24 2005 06/23/24 2105 06/23/24 2245 06/24/24 0139  BP: (!) 160/96  (!) 156/73 (!) 163/73  Pulse: 73  73 65  Resp: 16  18 18   Temp: 98.6 F (37 C)     SpO2: 99%  99% 99%  Weight:  101 kg    Height:  6' (1.829 m)      Constitutional: NAD, calm  Eyes: PERTLA, lids and conjunctivae normal ENMT: Mucous membranes are moist. Posterior pharynx clear of any exudate or lesions.   Neck: supple, no masses  Respiratory: no wheezing, no crackles. No accessory muscle use.  Cardiovascular: S1 & S2 heard, regular rate and rhythm. No extremity edema.   Abdomen: No tenderness, soft. Bowel sounds active.  Musculoskeletal: no clubbing / cyanosis. No joint deformity upper and lower extremities.   Skin: no significant rashes, lesions, ulcers. Warm, dry, well-perfused. Neurologic: CN 2-12 grossly intact. Sensation to light touch diminished in right hand. Strength 5/5 in all 4 limbs. Alert and oriented.  Psychiatric: Pleasant. Cooperative.    Labs and Imaging on Admission: I have personally reviewed following labs and imaging studies  CBC: Recent Labs  Lab 06/23/24 2108 06/23/24 2113  WBC 8.6  --   NEUTROABS 3.4  --   HGB 15.2 16.0  HCT 45.7  47.0  MCV 89.3  --   PLT 294  --    Basic Metabolic Panel: Recent Labs  Lab 06/23/24 2108 06/23/24 2113  NA 136 139  K 3.9 3.7  CL 105 104  CO2 22  --   GLUCOSE 92 93  BUN 12 13  CREATININE 1.04 1.00  CALCIUM 9.2  --    GFR: Estimated Creatinine Clearance: 94.3 mL/min (by C-G formula based on SCr of 1 mg/dL). Liver Function Tests: Recent Labs  Lab 06/23/24 2108  AST 30  ALT 22  ALKPHOS 103  BILITOT 0.6  PROT 6.9  ALBUMIN 3.4*   No results for input(s): LIPASE, AMYLASE in the  last 168 hours. No results for input(s): AMMONIA in the last 168 hours. Coagulation Profile: Recent Labs  Lab 06/23/24 2108  INR 1.0   Cardiac Enzymes: No results for input(s): CKTOTAL, CKMB, CKMBINDEX, TROPONINI in the last 168 hours. BNP (last 3 results) No results for input(s): PROBNP in the last 8760 hours. HbA1C: No results for input(s): HGBA1C in the last 72 hours. CBG: Recent Labs  Lab 06/23/24 2105  GLUCAP 100*   Lipid Profile: No results for input(s): CHOL, HDL, LDLCALC, TRIG, CHOLHDL, LDLDIRECT in the last 72 hours. Thyroid Function Tests: No results for input(s): TSH, T4TOTAL, FREET4, T3FREE, THYROIDAB in the last 72 hours. Anemia Panel: No results for input(s): VITAMINB12, FOLATE, FERRITIN, TIBC, IRON, RETICCTPCT in the last 72 hours. Urine analysis:    Component Value Date/Time   APPEARANCEUR Clear 08/29/2022 1101   GLUCOSEU Negative 08/29/2022 1101   BILIRUBINUR Negative 08/29/2022 1101   PROTEINUR Trace 08/29/2022 1101   NITRITE Negative 08/29/2022 1101   LEUKOCYTESUR Negative 08/29/2022 1101   Sepsis Labs: @LABRCNTIP (procalcitonin:4,lacticidven:4) )No results found for this or any previous visit (from the past 240 hours).   Radiological Exams on Admission: CT HEAD WO CONTRAST Result Date: 06/23/2024 CLINICAL DATA:  Neuro deficit, acute, stroke suspected EXAM: CT HEAD WITHOUT CONTRAST TECHNIQUE: Contiguous  axial images were obtained from the base of the skull through the vertex without intravenous contrast. RADIATION DOSE REDUCTION: This exam was performed according to the departmental dose-optimization program which includes automated exposure control, adjustment of the mA and/or kV according to patient size and/or use of iterative reconstruction technique. COMPARISON:  None Available. FINDINGS: Brain: No evidence of large-territorial acute infarction. No parenchymal hemorrhage. No mass lesion. No extra-axial collection. No mass effect or midline shift. No hydrocephalus. Basilar cisterns are patent. Vascular: No hyperdense vessel. Atherosclerotic calcifications are present within the cavernous internal carotid arteries. Skull: No acute fracture or focal lesion. Sinuses/Orbits: Paranasal sinuses and mastoid air cells are clear. The orbits are unremarkable. Other: None. IMPRESSION: No acute intracranial abnormality. Electronically Signed   By: Morgane  Naveau M.D.   On: 06/23/2024 21:52    Assessment/Plan   1. RUE and RLE numbness and intermittent weakness  - Appreciate neurology assessment and recommendations, planning for EEG and cervical myelogram    2. Hypertension  - Hydrochlorothiazide     3. OSA  - CPAP while sleeping     DVT prophylaxis: SCDs  Code Status: Full  Level of Care: Level of care: Telemetry Medical Family Communication: None present  Disposition Plan:  Patient is from: Home  Anticipated d/c is to: Home  Anticipated d/c date is: 8/26 or 06/26/24  Patient currently: Pending EEG and cervical myelogram  Consults called: Neurology  Admission status: Observation     Evalene GORMAN Sprinkles, MD [facility]  06/24/2024, 2:38 AM

## 2024-06-24 NOTE — ED Notes (Addendum)
 Assumed pt care at this time, EEG tech at bedside for set up. NIH 1 for mild right arm sensation deficets. All other in tact. VSS.   Assumed pt care at this time, tidy room, replace EKG leads. Pt denies pain or need for toileting. Verbalized understanding of all precautions.

## 2024-06-25 DIAGNOSIS — I10 Essential (primary) hypertension: Secondary | ICD-10-CM | POA: Diagnosis not present

## 2024-06-25 DIAGNOSIS — M5412 Radiculopathy, cervical region: Secondary | ICD-10-CM

## 2024-06-25 DIAGNOSIS — R200 Anesthesia of skin: Secondary | ICD-10-CM | POA: Diagnosis not present

## 2024-06-25 DIAGNOSIS — G4733 Obstructive sleep apnea (adult) (pediatric): Secondary | ICD-10-CM | POA: Diagnosis not present

## 2024-06-25 DIAGNOSIS — R531 Weakness: Secondary | ICD-10-CM | POA: Diagnosis not present

## 2024-06-25 DIAGNOSIS — R911 Solitary pulmonary nodule: Secondary | ICD-10-CM | POA: Insufficient documentation

## 2024-06-25 NOTE — Progress Notes (Addendum)
 DISCHARGE NOTE HOME [name] Fields to be discharged Home per MD order. Discussed prescriptions and follow up appointments with the patient. Prescriptions given to patient; medication list explained in detail. Patient verbalized understanding.  RN had printed and reviewed AVS with patient   Patient ambulated with RN to discharged home via private auto.  [name], RN

## 2024-06-25 NOTE — Progress Notes (Addendum)
 NEUROLOGY CONSULT FOLLOW UP NOTE   Date of service: June 25, 2024 Patient Name: RIOT WATERWORTH MRN:  [id number] DOB:  August 23, 1961  Interval Hx/subjective   Cervical myelogram shows severe C1-2 athropathy with mild to moderate spinal stenosis throughout cervical spine.   Patient denies pain, numbness today. No weakness seen in RLE. Patient reports he has been up walking today with no assistance.   Vitals   Vitals:   06/24/24 2135 06/24/24 2354 06/25/24 0412 06/25/24 0749  BP: (!) 142/81 131/77 (!) 157/81 130/68  Pulse: (!) 59 63 (!) 58 61  Resp: 18 17 18    Temp: 98 F (36.7 C) 97.6 F (36.4 C) 97.9 F (36.6 C) 97.7 F (36.5 C)  TempSrc: Oral Oral Oral Oral  SpO2: 96% 100% 98% 97%  Weight:      Height:         Body mass index is 30.2 kg/m.  Physical Exam   Constitutional: Appears well-developed and well-nourished.  Cardiovascular: Normal rate and regular rhythm.  Respiratory: Effort normal, non-labored breathing.   Neurologic Examination   Neuro: Mental Status: Patient is awake, alert, oriented to person, place, month, year, and situation. Patient is able to give a clear and coherent history. No signs of aphasia or neglect Cranial Nerves: II: Visual Fields are full. Pupils are equal, round, and reactive to light.   III,IV, VI: EOMI without ptosis or diploplia.  V: Facial sensation is symmetric to light touch  VII: Facial movement is symmetric.  VIII: hearing is intact to voice X: No dysarthria, no hoarseness XI: Shoulder shrug is symmetric. XII: tongue is midline without atrophy or fasciculations.  Motor: Tone is normal. Bulk is normal.  5/5 in all extremities Sensory: Sensation is symmetric to light touch in the arms and legs. Decreased pinprick sensation does continue to R thigh area.  Cerebellar: FNF and HKS are intact bilaterally   Medications  Current Facility-Administered Medications:    acetaminophen  (TYLENOL ) tablet 650 mg, 650 mg, Oral, Q6H  PRN **OR** acetaminophen  (TYLENOL ) suppository 650 mg, 650 mg, Rectal, Q6H PRN, Opyd, Evalene RAMAN, MD   hydrALAZINE  (APRESOLINE ) tablet 25 mg, 25 mg, Oral, Q6H PRN, Gonfa, Taye T, MD   hydrochlorothiazide  (HYDRODIURIL ) tablet 25 mg, 25 mg, Oral, Daily, Opyd, Timothy S, MD, 25 mg at 06/25/24 9147   ondansetron  (ZOFRAN ) tablet 4 mg, 4 mg, Oral, Q6H PRN, 4 mg at 06/24/24 0954 **OR** ondansetron  (ZOFRAN ) injection 4 mg, 4 mg, Intravenous, Q6H PRN, Opyd, Evalene RAMAN, MD   oxyCODONE  (Oxy IR/ROXICODONE ) immediate release tablet 5-10 mg, 5-10 mg, Oral, Q4H PRN, Opyd, Evalene RAMAN, MD, 10 mg at 06/24/24 2135   polyethylene glycol (MIRALAX  / GLYCOLAX ) packet 17 g, 17 g, Oral, Daily PRN, Opyd, Evalene RAMAN, MD   sodium chloride  flush (NS) 0.9 % injection 3 mL, 3 mL, Intravenous, Q12H, Opyd, Evalene RAMAN, MD, 3 mL at 06/25/24 0852  Labs and Diagnostic Imaging   CBC:  Recent Labs  Lab 06/23/24 2108 06/23/24 2113 06/24/24 0443  WBC 8.6  --  7.2  NEUTROABS 3.4  --   --   HGB 15.2 16.0 14.6  HCT 45.7 47.0 43.0  MCV 89.3  --  88.7  PLT 294  --  347    Basic Metabolic Panel:  Lab Results  Component Value Date   NA 136 06/24/2024   K 3.7 06/24/2024   CO2 25 06/24/2024   GLUCOSE 90 06/24/2024   BUN 10 06/24/2024   CREATININE 0.97 06/24/2024   CALCIUM 8.9  06/24/2024   GFRNONAA >60 06/24/2024   GFRAA 83 05/12/2020   Lipid Panel:  Lab Results  Component Value Date   LDLCALC 82 01/31/2024   HgbA1c:  Lab Results  Component Value Date   HGBA1C 5.9 (H) 01/31/2024   Urine Drug Screen: No results found for: LABOPIA, COCAINSCRNUR, LABBENZ, AMPHETMU, THCU, LABBARB  Alcohol Level     Component Value Date/Time   [facility] <15 06/23/2024 2108   INR  Lab Results  Component Value Date   INR 1.0 06/23/2024   APTT  Lab Results  Component Value Date   APTT 28 06/23/2024   AED levels: No results found for: PHENYTOIN, ZONISAMIDE, LAMOTRIGINE, LEVETIRACETA  CT Head without contrast(Personally  reviewed): No acute abnormality.     CT myelogram lumbar spine (07/2021) Broad-based disc protrusion and advanced facet hypertrophy at L4-5 results in moderate central canal stenosis with right greater than left subarticular narrowing. 2. Severe foraminal narrowing bilaterally at L4-5 is worse right than left. 3. Moderate foraminal narrowing bilaterally at L5-S1, left greater than right. 4. Mild left subarticular and bilateral foraminal stenosis at L2-3. 5. Moderate foraminal narrowing bilaterally at L3-4 is worse on the left. 6. Aortic Atherosclerosis (ICD10-I70.0).   CT myelogram cervical spine: Progressive, asymmetrically severe right C1-2 arthropathy. Unchanged disc and facet degeneration elsewhere, including moderate spinal stenosis at C6-7, mild spinal stenosis at C7-T1, and mild-to-moderate spinal stenosis at T1-2. Moderate-to-severe multilevel neural foraminal stenosis as above. Solid C3-C6 anterior and posterior fusion.  CT myelogram cervical spine (09/2019) CLINICAL DATA:  Progressively worsening posterior neck pain radiating into both shoulders and arms with new bilateral thumb and second through fourth finger numbness. Prior cervical fusion. 1. Sequelae of prior C3-C6 anterior and posterior fusion without significant residual stenosis. 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 2. Unchanged advanced lower cervical and upper thoracic spondylosis as described above. Moderate spinal canal stenosis at C6-C7. Severe neuroforaminal stenosis on the right at C7-T1 and bilaterally at T1-T2. 3. 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   Assessment   SHAWNE ESKELSON is a 63 y.o. male with hx of C3-C6 fusion, bilateral carpal tunnel decompression, WPW (s/p MRI-incompatible pacemaker, 1999), HTN, OSA who presents with episodic right arm and leg weakness in the setting of right neck pain and persistent right limb numbness.   On exam, no weakness noted to RLE  or other extremity, decreased pinprick sensation consistent with yesterday's exam.   Routine EEG negative. Cervical myelogram negative for canal stenosis or findings that correlate to RLE numbness and weakness.   Patient drives a truck for a living. He states that these episodes of severe RLE weakness have never happened while he has been driving, only when he has been walking. However, he told previous providers that he had trouble getting out of the car during on of the episodes. Recommend patient following up with his PCP for return to work note.    Recommendations   - follow-up with outpatient neurologist, patient has seen Dr. Margaret in the past and is open to seeing him again. Ambulatory referral ordered.  - outpatient EMG recommended  Patient is OK for discharge from neurology standpoint, with recommendations as above. Follow-up with outpatient neurology ordered.   ______________________________________________________________________   Bonney Rocky JAYSON Judithe, NP [facility]   If 7pm- 7am, please page neurology on call as listed in [facility]ION.

## 2024-06-25 NOTE — Discharge Summary (Signed)
 Physician Discharge Summary  [name] Fields FMW:[id number] DOB: October 05, 1961 DOA: 06/23/2024  PCP: Oris Camie BRAVO, NP  Admit date: 06/23/2024 Discharge date: 06/25/24  Admitted From: Home Disposition: Home Recommendations for Outpatient Follow-up:  Outpatient follow-up with PCP in 1 to 2 weeks Neurology to arrange outpatient follow-up Recommend outpatient follow-up with primary neurologist Check CMP and CBC at follow-up Please follow up on the following pending results: None  Home Health: No need identified Equipment/Devices: No need identified  Discharge Condition: Stable CODE STATUS: Full code Diet Orders (From admission, onward)     Start     Ordered   06/24/24 0914  Diet regular Room service appropriate? Yes; Fluid consistency: Thin  Diet effective now       Question Answer Comment  Room service appropriate? Yes   Fluid consistency: Thin      06/24/24 0913             Follow-up Information     Early, Camie BRAVO, NP. Schedule an appointment as soon as possible for a visit in 1 week(s).   Specialty: Nurse Practitioner Contact information: [address] [place] [address] [phone]                 Hospital course 63 year old M with PMH of OSA on CPAP, high-grade AVB s/p MRI incompatible PPM, WPW s/p ablation, HTN, bilateral carpal tunnel s/p release surgery, and prior C3-C6 fusion presenting with intermittent right arm and right leg numbness and weakness.   In ED, stable vitals.  Labs unrevealing.  CT head without acute finding.  Neurology consulted and recommended EEG and cervical myelogram.  EEG normal.  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  See detailed CT result below.  Symptoms improved.  Evaluated by neurology and cleared for discharge for  outpatient follow-up and outpatient EMG.  See individual problem list below for more.   Problems addressed during this hospitalization RUE and RLE numbness and weakness: Intermittent suggesting cervical radiculopathy.  Patient with prior cervical fusion.  He has some weakness on the right side on exam.  Light sensation is actually symmetric and intact.  CT head without acute finding.  EEG normal.  Patient has MRI incompatible pacemaker.  CT myelogram as above.  Cleared for discharge for outpatient follow-up by neurology.   Tobacco use disorder: Reports smoking about half a pack a day.  Extensive counseling about the importance of smoking cessation and treatment options.  He is motivated to quit but worried to use nicotine patch because of the rumors he heard.  After discussion, he is willing to try and also use the quit line service.   Lung nodule: Noted on CT chest in 12/2023.  Seems he missed his follow-up CT in June. -Encouraged to reschedule his follow-up chest CT chest given his risk.  History of high-grade AVB/WPW s/p ablation and MRI incompatible pacemaker.  History of cervical fusion - Cervical myelogram as above   Essential hypertension: BP slightly elevated - Continue home HCTZ   OSA on CPAP - Continue nightly CPAP   Class I obesity Body mass index is 30.2 kg/m.           Consultations: Neurology  Time spent 35  minutes  Vital signs Vitals:   06/24/24 2135 06/24/24 2354 06/25/24 0412 06/25/24 0749  BP: (!) 142/81 131/77 (!) 157/81 130/68  Pulse: ROLLEN)  59 63 (!) 58 61  Temp: 98 F (36.7 C) 97.6 F (36.4 C) 97.9 F (36.6 C) 97.7 F (36.5 C)  Resp: 18 17 18    Height:      Weight:      SpO2: 96% 100% 98% 97%  TempSrc: Oral Oral Oral Oral  BMI (Calculated):         Discharge exam  GENERAL: No apparent distress.  Nontoxic. HEENT: MMM.  Vision and hearing grossly intact.  NECK: Supple.  No apparent JVD.  RESP:  No IWOB.  Fair aeration bilaterally. CVS:   RRR. Heart sounds normal.  ABD/GI/GU: BS+. Abd soft, NTND.  MSK/EXT:  Moves extremities. No apparent deformity. No edema.  SKIN: no apparent skin lesion or wound NEURO: AA.  Oriented appropriately.  Clear speech.  No facial asymmetry.  Motor 4+/5 in RLE and 5/5 elsewhere.  Light sensation grossly intact.  Diminished reflexes but symmetric.  No pronator drift.  Finger-to-nose intact. PSYCH: Calm. Normal affect.   Discharge Instructions Discharge Instructions     Ambulatory referral to Neurology   Complete by: As directed    An appointment is requested in approximately: 8 weeks   Discharge instructions   Complete by: As directed    It has been a pleasure taking care of you!  You were hospitalized with intermittent numbness and weakness on the right side.  Your CT scan showed some narrowing of spinal canal and nerve roots in your neck which might be contributing.  Neurology recommended outpatient follow-up for further evaluation.  They will set up this follow-up.  We also recommend you follow-up with your neurosurgeon who did your neck surgery.  Do not forget to reschedule you CT chest to follow-up on the lung nodule  It is important that you quit smoking cigarettes.  You may use nicotine patch to help you quit smoking.  Nicotine patch is available over-the-counter.  You may also discuss other options to help you quit smoking with your primary care doctor. You can also talk to professional counselors at 1-800-QUIT-NOW 7878715577) for free smoking cessation counseling.     Take care,   Increase activity slowly   Complete by: As directed       Allergies as of 06/25/2024       Reactions   Lisinopril  Swelling   Lip swelling        Medication List     TAKE these medications    Clear Eyes Redness Relief 0.012-0.25 % Soln Generic drug: naphazoline-glycerin Place 1-2 drops into both eyes 4 (four) times daily as needed for eye irritation.   hydrochlorothiazide  25 MG  tablet Commonly known as: HYDRODIURIL  Take 1 tablet (25 mg total) by mouth daily.   ibuprofen 200 MG tablet Commonly known as: ADVIL Take 600 mg by mouth every 6 (six) hours as needed for mild pain (pain score 1-3) or moderate pain (pain score 4-6).   multivitamin with minerals Tabs tablet Take 1 tablet by mouth daily.   vitamin C 1000 MG tablet Take 1,000 mg by mouth daily.   Vitamin D  (Ergocalciferol ) 1.25 MG (50000 UNIT) Caps capsule Commonly known as: DRISDOL  Take 1 capsule (50,000 Units total) by mouth every 7 (seven) days.         Procedures/Studies:   CT CERVICAL SPINE W CONTRAST Result Date: 06/24/2024 EXAM: CT OF THE CERVICAL SPINE WITH INTRATHECAL CONTRAST 06/24/2024 11:08:15 AM TECHNIQUE: CT of the cervical spine was performed after the administration of 10mL of iohexol  (OMNIPAQUE ) 300 MG/ML solution with  multiplanar reconstructed images provided for interpretation. Dose modulation, iterative reconstruction, and/or weight based adjustment of the mA/kV was utilized to reduce the radiation dose to as low as reasonably achievable. COMPARISON: CT cervical myelogram 09/03/2019. CLINICAL HISTORY: Cervical radiculopathy, no red flags. TASHAWN LASWELL is a 63 y.o. male with hx of C3-C6 fusion, bilateral carpal tunnel decompression, WPW (s/p MRI-incompatible pacemaker, 1999), HTN, OSA who presents with episodic right arm and leg weakness in the setting of right neck pain and persistent right limb numbness. On exam, motor deficits not appreciated, however this may be confounded by patient's baseline strength. Sensory exam with deficits to pinprick in cervical dermatomes, however not completely (retained sensation to pinprick of C7 \\T \ C8 on dorsal aspect of hand however diminished on palmar aspect). FINDINGS: BONES AND ALIGNMENT: Chronic straightening of the normal cervical lordosis and trace anterolisthesis of C7 on T1. Prior C3 to C6 anterior and posterior fusion including C4 and C5  partial corpectomies with solid arthrodesis. No acute fracture or suspicious lesion. Progressive, asymmetrically severe right lateral C1-2 arthropathy. SOFT TISSUES: There is no prevertebral soft tissue swelling. C2-C3: Uncovertebral spurring and left facet arthrosis result in moderate right and mild left neural foraminal stenosis without spinal stenosis, unchanged. C3-C4: Prior fusion. No significant residual stenosis. C4-C5: Prior fusion. Unchanged osteophytic ridging contacting and mildly flattening the left ventral spinal cord. No significant stenosis. C5-C6: Prior fusion. No significant residual stenosis. C6-C7: Moderate-to-severe disc space narrowing. A broad-based posterior disc osteophyte complex and mild facet arthrosis result in moderate spinal stenosis and moderate bilateral neural foraminal stenosis, unchanged. C7-T1: Severe disc space narrowing. Disc bulging, uncovertebral spurring, and severe facet arthrosis result in mild spinal stenosis and severe right and moderate left neural foraminal stenosis, unchanged. T1-T2: A broad-based posterior disc osteophyte complex and mild facet arthrosis result in mild-to-moderate spinal stenosis, unchanged. IMPRESSION: 1. 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 2. Unchanged disc and facet degeneration elsewhere, including moderate spinal stenosis at C6-7, mild spinal stenosis at C7-T1, and mild-to-moderate spinal stenosis at T1-2. 3. Moderate-to-severe multilevel neural foraminal stenosis as above. 4. Solid C3-C6 anterior and posterior fusion. Electronically signed by: Dasie Hamburg MD 06/24/2024 01:14 PM EDT RP Workstation: [id number]   DG FL GUIDED LP INJECT MYELOGRAM CERVICAL Result Date: 06/24/2024 CLINICAL DATA:  63 year old male with right arm and leg weakness and numbness for cervical myelogram. FLUOROSCOPY: 22.1 mGy PROCEDURE: LUMBAR PUNCTURE FOR CERVICAL MYELOGRAM After thorough discussion of risks and benefits of the procedure including  bleeding, infection, injury to nerves, blood vessels, adjacent structures as well as headache and CSF leak, written and oral informed consent was obtained. Consent was obtained by Abigail C. [place], PA-C we discussed the high likelihood of obtaining a diagnostic study. Patient was positioned prone on the fluoroscopy table. Local anesthesia was provided with 1% lidocaine  without epinephrine after prepped and draped in the usual sterile fashion. Puncture was performed at L3-L4 using a 3 1/2 inch 22-gauge spinal needle via interlaminar approach. Using a single pass through the dura, the needle was placed within the thecal sac, with return of clear CSF. 10 mL of Omnipaque -300 was injected into the thecal sac, with normal opacification of the nerve roots and cauda equina consistent with free flow within the subarachnoid space. The patient was then moved to the trendelenburg position and contrast flowed into the Cervical spine region. I personally performed the lumbar puncture and administered the intrathecal contrast. I also personally supervised acquisition of the myelogram images. FINDINGS: Diffuse spread of contrast consistent with successful  myelogram injection. IMPRESSION: Technically successful myelogram injection. Performed By Lavanda Jurist, PA-C Electronically Signed   By: Juliene Balder M.D.   On: 06/24/2024 11:53   EEG adult Result Date: 06/24/2024 Shelton Arlin KIDD, MD     06/24/2024  8:59 AM Patient Name: ARLON BLEIER MRN: [id number] Epilepsy Attending: Arlin KIDD Shelton Referring Physician/Provider: Francis Ileana LOISE DEVONNA Date: 06/24/2024 Duration: 22.57 mins Patient history: 63 y.o. male who presents with right arm and leg weakness and numbness. EEG to evaluate for seizure Level of alertness: Awake AEDs during EEG study: None Technical aspects: This EEG study was done with scalp electrodes positioned according to the 10-20 International system of electrode placement. Electrical activity was reviewed with band  pass filter of 1-70Hz , sensitivity of 7 uV/mm, display speed of 57mm/sec with a 60Hz  notched filter applied as appropriate. EEG data were recorded continuously and digitally stored.  Video monitoring was available and reviewed as appropriate. Description: The posterior dominant rhythm consists of 9-10 Hz activity of moderate voltage (25-35 uV) seen predominantly in posterior head regions, symmetric and reactive to eye opening and eye closing. Hyperventilation and photic stimulation were not performed.   IMPRESSION: This study is within normal limits. No seizures or epileptiform discharges were seen throughout the recording. A normal interictal EEG does not exclude the diagnosis of epilepsy. Arlin KIDD Shelton   CT HEAD WO CONTRAST Result Date: 06/23/2024 CLINICAL DATA:  Neuro deficit, acute, stroke suspected EXAM: CT HEAD WITHOUT CONTRAST TECHNIQUE: Contiguous axial images were obtained from the base of the skull through the vertex without intravenous contrast. RADIATION DOSE REDUCTION: This exam was performed according to the departmental dose-optimization program which includes automated exposure control, adjustment of the mA and/or kV according to patient size and/or use of iterative reconstruction technique. COMPARISON:  None Available. FINDINGS: Brain: No evidence of large-territorial acute infarction. No parenchymal hemorrhage. No mass lesion. No extra-axial collection. No mass effect or midline shift. No hydrocephalus. Basilar cisterns are patent. Vascular: No hyperdense vessel. Atherosclerotic calcifications are present within the cavernous internal carotid arteries. Skull: No acute fracture or focal lesion. Sinuses/Orbits: Paranasal sinuses and mastoid air cells are clear. The orbits are unremarkable. Other: None. IMPRESSION: No acute intracranial abnormality. Electronically Signed   By: Morgane  Naveau M.D.   On: 06/23/2024 21:52       The results of significant diagnostics from this hospitalization  (including imaging, microbiology, ancillary and laboratory) are listed below for reference.     Microbiology: No results found for this or any previous visit (from the past 240 hours).   Labs:  CBC: Recent Labs  Lab 06/23/24 2108 06/23/24 2113 06/24/24 0443  WBC 8.6  --  7.2  NEUTROABS 3.4  --   --   HGB 15.2 16.0 14.6  HCT 45.7 47.0 43.0  MCV 89.3  --  88.7  PLT 294  --  347   BMP &GFR Recent Labs  Lab 06/23/24 2108 06/23/24 2113 06/24/24 0627  NA 136 139 136  K 3.9 3.7 3.7  CL 105 104 107  CO2 22  --  25  GLUCOSE 92 93 90  BUN 12 13 10   CREATININE 1.04 1.00 0.97  CALCIUM 9.2  --  8.9   Estimated Creatinine Clearance: 97.2 mL/min (by C-G formula based on SCr of 0.97 mg/dL). Liver & Pancreas: Recent Labs  Lab 06/23/24 2108  AST 30  ALT 22  ALKPHOS 103  BILITOT 0.6  PROT 6.9  ALBUMIN 3.4*   No results for input(s):  LIPASE, AMYLASE in the last 168 hours. No results for input(s): AMMONIA in the last 168 hours. Diabetic: No results for input(s): HGBA1C in the last 72 hours. Recent Labs  Lab 06/23/24 2105  GLUCAP 100*   Cardiac Enzymes: No results for input(s): CKTOTAL, CKMB, CKMBINDEX, TROPONINI in the last 168 hours. No results for input(s): PROBNP in the last 8760 hours. Coagulation Profile: Recent Labs  Lab 06/23/24 2108  INR 1.0   Thyroid Function Tests: No results for input(s): TSH, T4TOTAL, FREET4, T3FREE, THYROIDAB in the last 72 hours. Lipid Profile: No results for input(s): CHOL, HDL, LDLCALC, TRIG, CHOLHDL, LDLDIRECT in the last 72 hours. Anemia Panel: No results for input(s): VITAMINB12, FOLATE, FERRITIN, TIBC, IRON, RETICCTPCT in the last 72 hours. Urine analysis:    Component Value Date/Time   APPEARANCEUR Clear 08/29/2022 1101   GLUCOSEU Negative 08/29/2022 1101   BILIRUBINUR Negative 08/29/2022 1101   PROTEINUR Trace 08/29/2022 1101   NITRITE Negative 08/29/2022 1101    LEUKOCYTESUR Negative 08/29/2022 1101   Sepsis Labs: Invalid input(s): PROCALCITONIN, LACTICIDVEN   SIGNED:  Terresa Marlett T Peni Rupard, MD  [facility] 06/25/2024, 12:44 PM

## 2024-07-04 ENCOUNTER — Other Ambulatory Visit: Payer: Self-pay | Admitting: Physician Assistant

## 2024-07-04 DIAGNOSIS — M5416 Radiculopathy, lumbar region: Secondary | ICD-10-CM

## 2024-07-04 DIAGNOSIS — M48062 Spinal stenosis, lumbar region with neurogenic claudication: Secondary | ICD-10-CM

## 2024-07-05 ENCOUNTER — Other Ambulatory Visit

## 2024-07-15 ENCOUNTER — Other Ambulatory Visit: Payer: Self-pay | Admitting: Physician Assistant

## 2024-07-15 DIAGNOSIS — I10 Essential (primary) hypertension: Secondary | ICD-10-CM

## 2024-07-16 ENCOUNTER — Other Ambulatory Visit

## 2024-07-16 NOTE — Discharge Instructions (Signed)

## 2024-07-17 ENCOUNTER — Other Ambulatory Visit

## 2024-07-17 ENCOUNTER — Inpatient Hospital Stay
Admission: RE | Admit: 2024-07-17 | Discharge: 2024-07-17 | Disposition: A | Discharge status: Home / Self Care | Source: Ambulatory Visit | Attending: Physician Assistant | Admitting: Physician Assistant

## 2024-07-29 NOTE — Discharge Instructions (Signed)

## 2024-07-30 ENCOUNTER — Inpatient Hospital Stay: Admission: RE | Admit: 2024-07-30 | Source: Ambulatory Visit

## 2024-07-30 ENCOUNTER — Inpatient Hospital Stay
Admission: RE | Admit: 2024-07-30 | Discharge: 2024-07-30 | Disposition: A | Discharge status: Home / Self Care | Source: Ambulatory Visit | Attending: Physician Assistant | Admitting: Physician Assistant

## 2024-08-05 ENCOUNTER — Ambulatory Visit

## 2024-08-05 DIAGNOSIS — I4892 Unspecified atrial flutter: Secondary | ICD-10-CM | POA: Diagnosis not present

## 2024-08-06 ENCOUNTER — Ambulatory Visit: Payer: Self-pay

## 2024-08-06 ENCOUNTER — Encounter: Payer: Self-pay | Admitting: Nurse Practitioner

## 2024-08-06 ENCOUNTER — Ambulatory Visit (INDEPENDENT_AMBULATORY_CARE_PROVIDER_SITE_OTHER): Payer: PRIVATE HEALTH INSURANCE | Admitting: Nurse Practitioner

## 2024-08-06 VITALS — BP 118/72 | HR 64 | Wt 224.6 lb

## 2024-08-06 DIAGNOSIS — R310 Gross hematuria: Secondary | ICD-10-CM | POA: Diagnosis not present

## 2024-08-06 DIAGNOSIS — R972 Elevated prostate specific antigen [PSA]: Secondary | ICD-10-CM

## 2024-08-06 DIAGNOSIS — Z720 Tobacco use: Secondary | ICD-10-CM | POA: Diagnosis not present

## 2024-08-06 LAB — POCT URINALYSIS DIPSTICK
Appearance: NORMAL
Bilirubin, UA: NEGATIVE
Glucose, UA: NEGATIVE
Ketones, UA: NEGATIVE
Leukocytes, UA: NEGATIVE
Nitrite, UA: NEGATIVE
Odor: NORMAL
Protein, UA: NEGATIVE
Spec Grav, UA: <=1.005 — AB (ref 1.010–1.025)
Urobilinogen, UA: 0.2 U/dL
pH, UA: 6 (ref 5.0–8.0)

## 2024-08-06 NOTE — Telephone Encounter (Signed)
 Appt made.

## 2024-08-06 NOTE — Assessment & Plan Note (Signed)
 Gross hematuria starting last night resolved to microscopic hematuria by this morning. Possible causes include trauma from manual urine retention or possible repeat nephrolithiasis. No signs of infection, dysuria, fever, chills, or nausea. Will test today for cystitis and STI, PSA, and infection.  - Order appropriate testing - Encourage increased water intake to flush kidneys - Advise to report if hematuria becomes gross again

## 2024-08-06 NOTE — Assessment & Plan Note (Signed)
 Previously elevated PSA levels with missed urology appointment. Occasional slow urine stream. Plan to re-evaluate PSA levels today/ Referral placed again for urology for current findings.  - Order PSA test - Refer to urology for further evaluation - Consider antibiotics if PSA remains elevated or if white blood cell count is abnormal

## 2024-08-06 NOTE — Assessment & Plan Note (Signed)
 Continues to smoke but has significantly reduced usage. Acknowledges difficulty in quitting. - Encourage continued reduction in smoking

## 2024-08-06 NOTE — Patient Instructions (Signed)
 VISIT SUMMARY:  Today, you came in because you noticed blood in your urine last night. You also mentioned a history of kidney stones and some current back pain. We discussed your symptoms and your efforts to improve your health, including reducing smoking, losing weight, and managing stress.  YOUR PLAN:  -HEMATURIA (BLOOD IN URINE): Hematuria means there is blood in your urine. This can be caused by various factors, including trauma or kidney stones. We will do a urinalysis and kidney function tests to investigate further. Please drink plenty of water to help flush your kidneys and let us  know if you see blood in your urine again.  -NEPHROLITHIASIS (KIDNEY STONES): Nephrolithiasis refers to kidney stones, which you have had before. Your current back pain and microscopic blood in your urine could be related to this. Please continue to stay hydrated to help prevent new stones from forming, and monitor for any symptoms of kidney stones.  -ELEVATED PROSTATE-SPECIFIC ANTIGEN (PSA): Elevated PSA levels can indicate prostate issues, including prostatitis or prostate cancer. We will re-evaluate your PSA levels and refer you to a urologist for further evaluation. If your PSA remains high or if your white blood cell count is abnormal, we may consider antibiotics.  -TOBACCO USE: You have significantly reduced your smoking, which is excellent. Please continue to work on reducing your smoking further.  -GENERAL HEALTH MAINTENANCE: You are doing well with regular walking, a balanced diet, and stress reduction techniques like meditation. Keep up the good work with your healthy eating and exercise habits.  INSTRUCTIONS:  We have ordered a urinalysis, kidney function tests, and a PSA test. Please follow up with urology for further evaluation of your PSA levels. Continue to drink plenty of water, monitor your symptoms, and report any changes. If you notice blood in your urine again, please let us  know immediately.

## 2024-08-06 NOTE — Telephone Encounter (Signed)
 FYI Only or Action Required?: FYI only for provider.  Patient was last seen in primary care on 01/31/2024 by Early, Camie BRAVO, NP.  Called Nurse Triage reporting Hematuria.  Symptoms began yesterday.  Interventions attempted: Rest, hydration, or home remedies.  Symptoms are: unchanged.  Triage Disposition: See Physician Within 24 Hours  Patient/caregiver understands and will follow disposition?: Yes  Copied from CRM #[id number]. Topic: Clinical - Red Word Triage >> Aug 06, 2024  1:07 PM Whitney O wrote: [facility] that prompted transfer to Nurse Triage: need to see sarah early asap have blood in urine  Last night I had to go to bathroom and trying to get in the house and I was holding and squeezing until I got to restroom and I literally saw blood Reason for Disposition  Blood in urine  (Exception: Could be normal menstrual bleeding.)  Answer Assessment - Initial Assessment Questions Urine samples a few years ago- traces of blood in urine  Last night and when he was rushing to get inside he grabbed himself to stem the flow so he wouldn't have an accident- bright red blood tinged urine. He denies clots or only blood. He has voided about 4 times since and have all had blood in the urine. Denies pain, fever.   ED/UC precautions given with understanding.   1. COLOR of URINE: Describe the color of the urine.  (e.g., tea-colored, pink, red, bloody) Do you have blood clots in your urine? (e.g., none, pea, grape, small coin)     red 2. ONSET: When did the bleeding start?      Last night  3. EPISODES: How many times has there been blood in the urine? or How many times today?     4 times 4. PAIN with URINATION: Is there any pain with passing your urine? If Yes, ask: How bad is the pain?  (Scale 1-10; or mild, moderate, severe)     denies 5. FEVER: Do you have a fever? If Yes, ask: What is your temperature, how was it measured, and when did it start?     denies 6. ASSOCIATED  SYMPTOMS: Are you passing urine more frequently than usual?     denies 7. OTHER SYMPTOMS: Do you have any other symptoms? (e.g., back/flank pain, abdomen pain, vomiting)     denies  Protocols used: Urine - Blood In-A-AH

## 2024-08-06 NOTE — Progress Notes (Signed)
 Camie FORBES Doing, DNP, AGNP-c [facility] [address] [place], [place] [address] [phone]   ACUTE VISIT on 08/06/2024  Blood pressure 118/72, pulse 64, weight 224 lb 9.6 oz (101.9 kg).  Subjective:  HPI  History of Present Illness [name] Fields is a 63 year old male who presents with hematuria.  He noticed blood in his urine late last night after returning home, accompanied by an urgent need to urinate. This was complicated by difficulty accessing his house. While attempting to enter the home, he did manually constrict the urethra with compression of the penis, admittedly with a very firm compression. Upon reaching the bathroom, he observed blood in his urine again around 1 AM, which was less severe upon waking at 9 AM. He has not observed any visible blood since then, but microscopic evaluation confirmed the presence of blood cells in the urine.  He reports a history of kidney stones, which were sometimes painful and sometimes asymptomatic, and notes current back pain. He also notes a decrease in urine stream force and occasional slow urination that has been ongoing, but not a new finding.   He denies any burning sensation during urination, fever, chills, or nausea.   He reports occasional swelling in his feet and ankles, particularly after prolonged periods of driving, which subsides upon elevation.   His social history includes a significant reduction in smoking and weight loss attributed to increased water intake, regular walking, and dietary changes favoring baked or broiled foods over fried foods. He consumes a banana or Ensure in the morning, fruit for lunch, and a balanced meal in the evening. He also mentions reduced stress levels after ending a relationship and practices meditation regularly.  He has a history of elevated PSA and referral to urology, but he was unable to make the initial appointment due to conflict with work and has not rescheduled. He is  open to   ROS negative except for what is listed in HPI. History, Medications, Surgery, SDOH, and Family History reviewed and updated as appropriate.  Objective:  Physical Exam Vitals and nursing note reviewed.  Constitutional:      General: He is not in acute distress.    Appearance: Normal appearance.  HENT:     Head: Normocephalic.  Eyes:     Conjunctiva/sclera: Conjunctivae normal.  Cardiovascular:     Rate and Rhythm: Normal rate and regular rhythm.     Pulses: Normal pulses.     Heart sounds: Normal heart sounds.  Pulmonary:     Effort: Pulmonary effort is normal.     Breath sounds: Normal breath sounds.  Abdominal:     General: Abdomen is flat. Bowel sounds are normal. There is no distension.     Palpations: Abdomen is soft. There is no mass.     Tenderness: There is no abdominal tenderness. There is no right CVA tenderness, left CVA tenderness, guarding or rebound.  Musculoskeletal:        General: Normal range of motion.     Right lower leg: No edema.     Left lower leg: No edema.  Skin:    General: Skin is warm and dry.     Capillary Refill: Capillary refill takes less than 2 seconds.  Neurological:     Mental Status: He is alert and oriented to person, place, and time.     Motor: No weakness.  Psychiatric:        Mood and Affect: Mood normal.  Behavior: Behavior normal.         Assessment & Plan:   Problem List Items Addressed This Visit     Smoking trying to quit   Continues to smoke but has significantly reduced usage. Acknowledges difficulty in quitting. - Encourage continued reduction in smoking       Gross hematuria - Primary   Gross hematuria starting last night resolved to microscopic hematuria by this morning. Possible causes include trauma from manual urine retention or possible repeat nephrolithiasis. No signs of infection, dysuria, fever, chills, or nausea. Will test today for cystitis and STI, PSA, and infection.  - Order appropriate  testing - Encourage increased water intake to flush kidneys - Advise to report if hematuria becomes gross again      Relevant Orders   Chlamydia/Gonococcus/Trichomonas, NAA   POCT urinalysis dipstick (Completed)   Urine Culture   CBC with Differential   Comprehensive metabolic panel with GFR   PSA Total (Reflex To Free)   HIV Antibody (routine testing w rflx)   RPR   Ambulatory referral to Urology   Elevated PSA   Previously elevated PSA levels with missed urology appointment. Occasional slow urine stream. Plan to re-evaluate PSA levels today/ Referral placed again for urology for current findings.  - Order PSA test - Refer to urology for further evaluation - Consider antibiotics if PSA remains elevated or if white blood cell count is abnormal      Relevant Orders   Ambulatory referral to Urology   Engages in regular walking and consumes a balanced diet with minimal fried foods. Reports significant weight loss and reduced stress levels. - Encourage continued healthy eating and exercise - Support stress reduction techniques such as meditation    Camie FORBES Doing, DNP, AGNP-c Time: 30 minutes, >50% spent counseling, care coordination, chart review, and documentation.

## 2024-08-07 LAB — CUP PACEART REMOTE DEVICE CHECK
Battery Remaining Longevity: 86 mo
Battery Remaining Percentage: 76 %
Battery Voltage: 3.02 V
Brady Statistic AP VP Percent: 2.3 %
Brady Statistic AP VS Percent: 2.6 %
Brady Statistic AS VP Percent: 12 %
Brady Statistic AS VS Percent: 83 %
Brady Statistic RA Percent Paced: 4.2 %
Brady Statistic RV Percent Paced: 15 %
Date Time Interrogation Session: 20251006223645
Implantable Lead Connection Status: 753985
Implantable Lead Connection Status: 753985
Implantable Lead Implant Date: 19990719
Implantable Lead Implant Date: 19990719
Implantable Lead Location: 753859
Implantable Lead Location: 753860
Implantable Pulse Generator Implant Date: 20220907
Lead Channel Impedance Value: 380 Ohm
Lead Channel Impedance Value: 400 Ohm
Lead Channel Pacing Threshold Amplitude: 0.5 V
Lead Channel Pacing Threshold Amplitude: 0.75 V
Lead Channel Pacing Threshold Pulse Width: 0.5 ms
Lead Channel Pacing Threshold Pulse Width: 0.5 ms
Lead Channel Sensing Intrinsic Amplitude: 10.3 mV
Lead Channel Sensing Intrinsic Amplitude: 4.7 mV
Lead Channel Setting Pacing Amplitude: 2 V
Lead Channel Setting Pacing Amplitude: 2.5 V
Lead Channel Setting Pacing Pulse Width: 0.5 ms
Lead Channel Setting Sensing Sensitivity: 2 mV
Pulse Gen Model: 2272
Pulse Gen Serial Number: 6519619

## 2024-08-07 LAB — COMPREHENSIVE METABOLIC PANEL WITH GFR
ALT: 18 IU/L (ref 0–44)
AST: 20 IU/L (ref 0–40)
Albumin: 4.3 g/dL (ref 3.9–4.9)
Alkaline Phosphatase: 112 IU/L (ref 47–123)
BUN/Creatinine Ratio: 15 (ref 10–24)
BUN: 15 mg/dL (ref 8–27)
Bilirubin Total: 0.3 mg/dL (ref 0.0–1.2)
CO2: 19 mmol/L — AB (ref 20–29)
Calcium: 9.4 mg/dL (ref 8.6–10.2)
Chloride: 101 mmol/L (ref 96–106)
Creatinine, Ser: 1.01 mg/dL (ref 0.76–1.27)
Globulin, Total: 2.8 g/dL (ref 1.5–4.5)
Glucose: 81 mg/dL (ref 70–99)
Potassium: 4 mmol/L (ref 3.5–5.2)
Sodium: 137 mmol/L (ref 134–144)
Total Protein: 7.1 g/dL (ref 6.0–8.5)
eGFR: 84 mL/min/1.73 (ref >59)

## 2024-08-07 LAB — CBC WITH DIFFERENTIAL/PLATELET
Basophils Absolute: 0 x10E3/uL (ref 0.0–0.2)
Basos: 0 %
EOS (ABSOLUTE): 0.1 x10E3/uL (ref 0.0–0.4)
Eos: 1 %
Hematocrit: 44 % (ref 37.5–51.0)
Hemoglobin: 14.7 g/dL (ref 13.0–17.7)
Immature Grans (Abs): 0 x10E3/uL (ref 0.0–0.1)
Immature Granulocytes: 0 %
Lymphocytes Absolute: 3.4 x10E3/uL — ABNORMAL HIGH (ref 0.7–3.1)
Lymphs: 47 %
MCH: 29.8 pg (ref 26.6–33.0)
MCHC: 33.4 g/dL (ref 31.5–35.7)
MCV: 89 fL (ref 79–97)
Monocytes Absolute: 0.5 x10E3/uL (ref 0.1–0.9)
Monocytes: 7 %
Neutrophils Absolute: 3.2 x10E3/uL (ref 1.4–7.0)
Neutrophils: 45 %
Platelets: 298 x10E3/uL (ref 150–450)
RBC: 4.94 x10E6/uL (ref 4.14–5.80)
RDW: 13 % (ref 11.6–15.4)
WBC: 7.1 x10E3/uL (ref 3.4–10.8)

## 2024-08-07 LAB — PSA TOTAL (REFLEX TO FREE): Prostate Specific Ag, Serum: 8.7 ng/mL — AB (ref 0.0–4.0)

## 2024-08-07 LAB — FPSA% REFLEX
% FREE PSA: 9.3 %
PSA, FREE: 0.81 ng/mL

## 2024-08-07 LAB — HIV ANTIBODY (ROUTINE TESTING W REFLEX): HIV Screen 4th Generation wRfx: NONREACTIVE

## 2024-08-07 LAB — RPR: RPR Ser Ql: NONREACTIVE

## 2024-08-07 NOTE — Progress Notes (Signed)
 Remote PPM Transmission

## 2024-08-08 LAB — CHLAMYDIA/GONOCOCCUS/TRICHOMONAS, NAA
Chlamydia by NAA: NEGATIVE
Gonococcus by NAA: NEGATIVE
Trich vag by NAA: NEGATIVE

## 2024-08-08 LAB — URINE CULTURE: Organism ID, Bacteria: NO GROWTH

## 2024-08-08 NOTE — Progress Notes (Signed)
 Remote PPM Transmission

## 2024-08-12 ENCOUNTER — Ambulatory Visit: Payer: Self-pay | Admitting: Student in an Organized Health Care Education/Training Program

## 2024-08-14 ENCOUNTER — Ambulatory Visit: Payer: Self-pay | Admitting: Nurse Practitioner

## 2024-09-09 NOTE — Discharge Instructions (Signed)

## 2024-09-10 ENCOUNTER — Other Ambulatory Visit

## 2024-09-10 ENCOUNTER — Inpatient Hospital Stay
Admission: RE | Admit: 2024-09-10 | Discharge: 2024-09-10 | Disposition: A | Discharge status: Home / Self Care | Source: Ambulatory Visit | Attending: Physician Assistant | Admitting: Physician Assistant

## 2024-09-11 ENCOUNTER — Encounter: Payer: Self-pay | Admitting: Urology

## 2024-09-11 ENCOUNTER — Ambulatory Visit: Admitting: Urology

## 2024-09-11 VITALS — BP 140/85 | HR 90 | Ht 73.0 in | Wt 234.0 lb

## 2024-09-11 DIAGNOSIS — N401 Enlarged prostate with lower urinary tract symptoms: Secondary | ICD-10-CM

## 2024-09-11 DIAGNOSIS — R972 Elevated prostate specific antigen [PSA]: Secondary | ICD-10-CM | POA: Diagnosis not present

## 2024-09-11 DIAGNOSIS — R310 Gross hematuria: Secondary | ICD-10-CM

## 2024-09-11 LAB — URINALYSIS, COMPLETE
Bilirubin, UA: NEGATIVE
Glucose, UA: NEGATIVE
Ketones, UA: NEGATIVE
Leukocytes,UA: NEGATIVE
Nitrite, UA: NEGATIVE
Protein,UA: NEGATIVE
Specific Gravity, UA: 1.025 (ref 1.005–1.030)
Urobilinogen, Ur: 0.2 mg/dL (ref 0.2–1.0)
pH, UA: 6 (ref 5.0–7.5)

## 2024-09-11 LAB — MICROSCOPIC EXAMINATION

## 2024-09-11 NOTE — Progress Notes (Signed)
 09/11/2024 11:35 AM   [name] Fields April 13, 1961 [id number]  Referring provider: Oris Camie BRAVO, NP [address] [place],  [place] [address]  Chief Complaint  Patient presents with   Hematuria    HPI: [name] Fields is a 63 y.o. fe male referred for evaluation and management of hematuria  Gross hematuria: 08/05/2024- he had significant urge to void and compressed his urethra and noted gross hematuria when voiding which was present at his next void and upon awakening the following morning with subsequent resolution of the hematuria.  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 Jude     DOI 1999; gen re in the summer he has o pain syndrome BP 135% at time long so is his we have been short of his device is in a in a in a Foley disease since the stents removed about 3.0-4 seconds on the episode the patient is very 50 and a [place]  when his pacemaker but when he and his placement 2011   Presence of permanent cardiac pacemaker    Sleep apnea    uses CPAP sometimes   WPW (Wolff-Parkinson-White  syndrome)    s/pRFCA    Surgical History: Past Surgical History:  Procedure Laterality Date   CARDIAC DEFIBRILLATOR PLACEMENT  2011   St Jude -- [employer] RF   CARPAL TUNNEL RELEASE Left 08/18/2020   Procedure: CARPAL TUNNEL RELEASE;  Surgeon: Murrell Kuba, MD;  Location: [facility];  Service: Orthopedics;  Laterality: Left;  IV REGIONAL FOREARM BLOCK   CARPAL TUNNEL RELEASE Right 12/01/2020   Procedure: CARPAL TUNNEL RELEASE;  Surgeon: Murrell Kuba, MD;  Location: [facility];  Service: Orthopedics;  Laterality: Right;  IV REGIONAL FOREARM BLOCK   PACEMAKER INSERTION  1999   explanted 2011   PPM GENERATOR CHANGEOUT N/A 07/07/2021   Procedure: PPM GENERATOR CHANGEOUT;  Surgeon: Fernande Elspeth BROCKS, MD;  Location: [facility] INVASIVE CV [facility];  Service: Cardiovascular;  Laterality: N/A;    Home Medications:  Allergies as of 09/11/2024       Reactions   Lisinopril  Swelling, Anaphylaxis   Lip swelling        Medication List        Accurate as of September 11, 2024 11:35 AM. If you have any questions, ask your nurse or doctor.          STOP taking these medications    ibuprofen 200 MG tablet Commonly known as: ADVIL       TAKE these medications    Clear Eyes Redness Relief 0.012-0.25 % Soln Generic drug: naphazoline-glycerin Place 1-2 drops into both eyes 4 (four) times daily as  needed for eye irritation.   hydrochlorothiazide  25 MG tablet Commonly known as: HYDRODIURIL  Take 1 tablet (25 mg total) by mouth daily.   multivitamin with minerals Tabs tablet Take 1 tablet by mouth daily.   vitamin C 1000 MG tablet Take 1,000 mg by mouth daily.   Vitamin D  (Ergocalciferol ) 1.25 MG (50000 UNIT) Caps capsule Commonly known as: DRISDOL  Take 1 capsule (50,000 Units total) by mouth every 7 (seven) days.        Allergies:  Allergies  Allergen Reactions   Lisinopril  Swelling and Anaphylaxis    Lip swelling    Family History: Family History  Problem  Relation Age of Onset   Breast cancer Mother    Heart disease Mother    Heart Problems Mother        pacemaker   Prostate cancer Father    Hypertension Father     Social History:  reports that he has been smoking cigarettes. He started smoking about 47 years ago. He has a 23.9 pack-year smoking history. He has never used smokeless tobacco. He reports current alcohol use of about 7.0 standard drinks of alcohol per week. He reports that he does not use drugs.   Physical Exam: BP (!) 140/85   Pulse 90   Ht 6' 1 (1.854 m)   Wt 234 lb (106.1 kg)   BMI 30.87 kg/m   Constitutional:  Alert and oriented, No acute distress. HEENT: [place] AT. Respiratory: Normal respiratory effort, no increased work of breathing. GI: Abdomen is soft, nontender, nondistended, no abdominal masses GU: Prostate 50 g, flat/smooth without nodules Psychiatric: Normal mood and affect.  Laboratory:  Urinalysis Dipstick 1+ blood Microscopy 11-30 RBC   Assessment & Plan:    1.  Gross hematuria AUA risk stratification: High Microhematuria on UA today We discussed the recommended evaluation of high risk hematuria which consist of CT urogram and cystoscopy.  The procedures were discussed in detail and he/she has elected to proceed with further evaluation All questions were answered CTU order placed and cystoscopy will be scheduled under sedation  2.  Elevated PSA Benign DRE Pacemaker/defibrillator which he states is not MRI compatible.  Will check with MR department Will tentatively plan TRUS/biopsy prostate and cystoscopy in same-day surgery under IV sedation    Tymar Polyak C Magalie Almon, MD  [facility] [address], [address] [place], [place] [address] [phone]

## 2024-09-11 NOTE — Patient Instructions (Signed)
 Schedule [phone]

## 2024-09-16 ENCOUNTER — Telehealth: Payer: Self-pay

## 2024-09-16 ENCOUNTER — Other Ambulatory Visit: Payer: Self-pay

## 2024-09-16 DIAGNOSIS — R310 Gross hematuria: Secondary | ICD-10-CM

## 2024-09-16 DIAGNOSIS — R972 Elevated prostate specific antigen [PSA]: Secondary | ICD-10-CM

## 2024-09-16 NOTE — Progress Notes (Signed)
 See telephone note

## 2024-09-16 NOTE — Progress Notes (Signed)
 Surgical Physician Order Form [facility]  Dr. Glendia Barba, MD  * Scheduling expectation : Next Available  *Length of Case: 45 min  *Clearance needed: no  *Anticoagulation Instructions: N/A  *Aspirin Instructions: N/A  *Post-op visit Date/Instructions:  TBD  *Diagnosis: Elevated PSA; gross hematuria  *Procedure:  Prostate Biopsy (55700) TRUS (23127) US  Hlpiz(23057); cystoscopy w/ bilateral retrograde pyelograms; possible bladder biopsy/TURBT   Additional orders: N/A  -Admit type: OUTpatient  -Anesthesia: Choice  -VTE Prophylaxis Standing Order SCD's       Other:   -Standing Lab Orders Per Anesthesia    Lab other: UA&Urine Culture  -Standing Test orders EKG/Chest x-ray per Anesthesia       Test other:   - Medications:  Ceftriaxone(Rocephin) 1gm IV  -Other orders:  Fleets enema AM

## 2024-09-16 NOTE — Discharge Instructions (Signed)

## 2024-09-17 ENCOUNTER — Ambulatory Visit
Admission: RE | Admit: 2024-09-17 | Discharge: 2024-09-17 | Disposition: A | Discharge status: Home / Self Care | Source: Ambulatory Visit | Attending: Physician Assistant | Admitting: Physician Assistant

## 2024-09-17 DIAGNOSIS — M48062 Spinal stenosis, lumbar region with neurogenic claudication: Secondary | ICD-10-CM

## 2024-09-17 DIAGNOSIS — M5416 Radiculopathy, lumbar region: Secondary | ICD-10-CM

## 2024-09-17 MED ORDER — MEPERIDINE HCL 50 MG/ML IJ SOLN: 50.0000 mg | Freq: Once | INTRAMUSCULAR | Status: DC | PRN

## 2024-09-17 MED ORDER — ONDANSETRON HCL 4 MG/2ML IJ SOLN: 4.0000 mg | Freq: Once | INTRAMUSCULAR | Status: DC | PRN

## 2024-09-17 MED ORDER — IOPAMIDOL (ISOVUE-M 200) INJECTION 41%
20.0000 mL | Freq: Once | INTRAMUSCULAR | Status: AC
  Administered 2024-09-17: 20 mL via INTRATHECAL

## 2024-09-17 MED ORDER — DIAZEPAM 5 MG PO TABS
10.0000 mg | ORAL_TABLET | Freq: Once | ORAL | Status: AC
  Administered 2024-09-17: 10 mg via ORAL

## 2024-09-24 ENCOUNTER — Telehealth: Payer: Self-pay

## 2024-09-24 NOTE — Telephone Encounter (Signed)
 Per Dr. Twylla, Patient is to be scheduled for Transrectal Ultrasound Guided Prostate Biopsy, Diagnostic Cystoscopy with Bilateral Retrograde Pyelograms, Possible Bladder Biopsy   [name] Fields was contacted and possible surgical dates were discussed, Thursday December 18th, 2025 was agreed upon for surgery.   Patient was instructed that Dr. Twylla will require them to provide a pre-op UA & CX prior to surgery. This was ordered and scheduled drop off appointment was made for 10/02/2024.    Patient was directed to call [phone] between 1-3pm the day before surgery to find out surgical arrival time.  Instructions were given not to eat or drink from midnight on the night before surgery and have a driver for the day of surgery. On the surgery day patient was instructed to enter through the [facility] entrance of [facility] report the Same Day Surgery desk.   Pre-Admit Testing will be in contact via phone to set up an interview with the anesthesia team to review your history and medications prior to surgery.   Reminder of this information was sent via MyChart to the patient.

## 2024-09-24 NOTE — Progress Notes (Signed)
   [facility] Posting Form  Surgery Date: Date: 10/17/2024  Surgeon: Dr. Glendia Barba, MD  Inpt ( No  )   Outpt (Yes)   Obs ( No  )   Diagnosis: R97.20 Elevated Prostate Specific Antigen, R31.0 Gross Hematuria  -CPT: 55700, 23127, 23057, 74420, 52000, 47795, 47765  Surgery: Transrectal Ultrasound Guided Prostate Biopsy, Diagnostic Cystoscopy with Bilateral Retrograde Pyelograms, Possible Bladder Biopsy  Stop Anticoagulations: Yes and also hold ASA  Cardiac/Medical/Pulmonary Clearance needed: no  *Orders entered into EPIC  Date: 09/24/24   *Case booked in EPIC  Date: 09/24/24  *Notified pt of Surgery: Date: 09/24/24  PRE-OP UA & CX: yes, will obtain in clinic on 10/02/2024  *Placed into Prior Authorization Work [place] Date: 09/24/24  Assistant/laser/rep:No

## 2024-09-25 ENCOUNTER — Other Ambulatory Visit: Payer: Self-pay | Admitting: Urology

## 2024-09-25 DIAGNOSIS — R972 Elevated prostate specific antigen [PSA]: Secondary | ICD-10-CM

## 2024-09-30 ENCOUNTER — Other Ambulatory Visit

## 2024-09-30 DIAGNOSIS — R972 Elevated prostate specific antigen [PSA]: Secondary | ICD-10-CM

## 2024-09-30 DIAGNOSIS — R310 Gross hematuria: Secondary | ICD-10-CM

## 2024-09-30 LAB — URINALYSIS, COMPLETE
Bilirubin, UA: NEGATIVE
Glucose, UA: NEGATIVE
Ketones, UA: NEGATIVE
Leukocytes,UA: NEGATIVE
Nitrite, UA: NEGATIVE
Protein,UA: NEGATIVE
Specific Gravity, UA: 1.015 (ref 1.005–1.030)
Urobilinogen, Ur: 1 mg/dL (ref 0.2–1.0)
pH, UA: 7.5 (ref 5.0–7.5)

## 2024-09-30 LAB — MICROSCOPIC EXAMINATION: Bacteria, UA: NONE SEEN

## 2024-10-02 ENCOUNTER — Other Ambulatory Visit

## 2024-10-04 LAB — CULTURE, URINE COMPREHENSIVE

## 2024-10-07 NOTE — Progress Notes (Signed)
 PROCEDURE PERFORMED: Diagnostic R C7 and C8 medial branch blockade.   REASON FOR PROCEDURES:  Cervical spondylosis without myelopathy with lateral neck pain.   SURGEON:   Debby Kansky, M.D.   PROCEDURE:  After obtaining informed consent, the patient was placed in the lateral decubitus position on the fluoroscopy table.  Blood pressure, pulse, and pulse oximetry was assessed pre and post-procedure. The cervical skin was prepped and draped in a sterile fashion with Chloraprep and sterile towels.     A #25 gauge 2 inch spinal needle was placed under fluoroscopic control onto the mid portion of the R C7 articular pillar and superolateral corner of the T1 transverse process. Approximately 0.5-1 mL of 240 mg/mL Omnipaque  contrast dye was injected, demonstrating focal spread.  Subsequent to this, a total of 0.4 mL of 0.5% preservative free bupivacaine  was injected onto each medial branch.  Key fluoroscopic images of the procedure were saved to a local server. The patient tolerated the procedure well and was transitioned to the post procedure holding area uneventfully.  The patient was monitored for adverse allergic, paralytic and hypertensive reactions. The patient was discharged without complication.

## 2024-10-08 ENCOUNTER — Inpatient Hospital Stay: Admission: RE | Admit: 2024-10-08 | Discharge: 2024-10-08 | Attending: Urology | Admitting: Urology

## 2024-10-08 ENCOUNTER — Encounter: Payer: Self-pay | Admitting: Physician Assistant

## 2024-10-08 ENCOUNTER — Other Ambulatory Visit: Payer: Self-pay

## 2024-10-08 ENCOUNTER — Encounter: Payer: Self-pay | Admitting: Cardiology

## 2024-10-08 ENCOUNTER — Telehealth (HOSPITAL_BASED_OUTPATIENT_CLINIC_OR_DEPARTMENT_OTHER): Payer: Self-pay | Admitting: *Deleted

## 2024-10-08 ENCOUNTER — Ambulatory Visit: Attending: Physician Assistant | Admitting: Physician Assistant

## 2024-10-08 VITALS — BP 142/70 | HR 64 | Ht 73.0 in | Wt 226.0 lb

## 2024-10-08 VITALS — Ht 73.0 in | Wt 235.0 lb

## 2024-10-08 DIAGNOSIS — Z01818 Encounter for other preprocedural examination: Secondary | ICD-10-CM

## 2024-10-08 DIAGNOSIS — I10 Essential (primary) hypertension: Secondary | ICD-10-CM

## 2024-10-08 DIAGNOSIS — Z950 Presence of cardiac pacemaker: Secondary | ICD-10-CM

## 2024-10-08 DIAGNOSIS — N401 Enlarged prostate with lower urinary tract symptoms: Secondary | ICD-10-CM

## 2024-10-08 DIAGNOSIS — R972 Elevated prostate specific antigen [PSA]: Secondary | ICD-10-CM

## 2024-10-08 DIAGNOSIS — I4892 Unspecified atrial flutter: Secondary | ICD-10-CM

## 2024-10-08 HISTORY — DX: Elevated prostate specific antigen (PSA): R97.20

## 2024-10-08 LAB — CUP PACEART INCLINIC DEVICE CHECK
Battery Remaining Longevity: 94 mo
Battery Voltage: 3.01 V
Brady Statistic RA Percent Paced: 4.2 %
Brady Statistic RV Percent Paced: 14 %
Date Time Interrogation Session: 20251209170751
Implantable Lead Connection Status: 753985
Implantable Lead Connection Status: 753985
Implantable Lead Implant Date: 19990719
Implantable Lead Implant Date: 19990719
Implantable Lead Location: 753859
Implantable Lead Location: 753860
Implantable Pulse Generator Implant Date: 20220907
Lead Channel Impedance Value: 375 Ohm
Lead Channel Impedance Value: 375 Ohm
Lead Channel Pacing Threshold Amplitude: 0.5 V
Lead Channel Pacing Threshold Amplitude: 0.5 V
Lead Channel Pacing Threshold Amplitude: 0.75 V
Lead Channel Pacing Threshold Amplitude: 0.75 V
Lead Channel Pacing Threshold Pulse Width: 0.5 ms
Lead Channel Pacing Threshold Pulse Width: 0.5 ms
Lead Channel Pacing Threshold Pulse Width: 0.5 ms
Lead Channel Pacing Threshold Pulse Width: 0.5 ms
Lead Channel Sensing Intrinsic Amplitude: 4.3 mV
Lead Channel Sensing Intrinsic Amplitude: 9.6 mV
Lead Channel Setting Pacing Amplitude: 2 V
Lead Channel Setting Pacing Amplitude: 2.5 V
Lead Channel Setting Pacing Pulse Width: 0.5 ms
Lead Channel Setting Sensing Sensitivity: 2 mV
Pulse Gen Model: 2272
Pulse Gen Serial Number: 6519619

## 2024-10-08 NOTE — Progress Notes (Signed)
 Cardiology Office Note:  .   Date:  10/08/2024  ID:  Norleen LITTIE Ancona, DOB 10/15/1961, MRN [id number] PCP: Oris Camie BRAVO, NP  [facility] Providers Cardiologist:  None Electrophysiologist:  Elspeth Sage, MD (Inactive) >>> Dr. Almetta (planned) Sleep Medicine:  Wilbert Bihari, MD {  History of Present Illness: .   JOAN AVETISYAN is a 63 y.o. male w/PMHx of  WPW (ablated by record, pt reports +/- 1999),  HTN, HLD, OSA w/CPAP,  high grade AV bock w/PPM  He last saw Dr. Sage Dec 2022, doing well, atypical CP, suspect GERD and Rx prilosec.  He saw A. 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 He denies CP, SOB Does feel random fleeting palpitations that tend to make him take a deep breath for.   No dizzy spells, near syncope or syncope Intact pacer function SCAF (flutter), low burden > planned to monitor burden No changes made  Coronary CT ordered April 2025 via Dr. Sage 2/2 LAD calcification noted on a lung CT IMPRESSION: 1. 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 While overall mild disease, there is significant noncalcified plaque and positive remodeling in distal left main with stenosis of almost 50% visually. Recommend aggressive medical management, consider cath if there are high risk clinical symptoms given location of stenosis.   2. Coronary calcium score of 71. This was 57th percentile for age-, sex-, and race- matched controls. 3. Total plaque volume 304 mm3 which is 43rd percentile for age- and sex- matched controls (calcified plaque 43 mm3; noncalcified plaque 261 mm3). Total plaque volume is severe.   4. Normal coronary origin with right dominance.   INTERPRETATION: CAD-RADS  2: Mild non-obstructive CAD (25-49%). Consider non-atherosclerotic causes of chest pain. Consider preventive therapy and risk factor modification.   Admitted 06/23/24 - /06/25/24 RUE/RLE weakness, numbness Seen by neurology, symptoms intermittent suggesting cervical radiculopathy.  Patient with prior cervical fusion.  He has some weakness on the right side on exam.  Light sensation is actually symmetric and intact.  CT head without acute finding.  EEG normal.  Patient has MRI incompatible pacemaker.  CT myelogram as above.  Cleared for discharge for outpatient follow-up by neurology.   Neurology noted: Patient drives a truck for a living. He states that these episodes of severe RLE weakness have never happened while he has been driving, only when he has been walking. However, he told previous providers that he had trouble getting out of the car during on of the episodes. Recommend patient following up with his PCP for return to work note.   Saw his spine specialist 9/16 Reported b/l UE symptoms as well as RLE, R lower back pain pain, and neck pain  Reported falls associated with his RLE giving out  Has had a couple injection/branch blockade   Remote dated 08/05/24 AMS <1% AFlutter/PACs No episode between Feb and October 2025 (Longest was Dec 2024, 3 hours 12 min)   Today's visit is scheduled as an annual visit ROS:   He reports dong well Has had some injections for his back/neck pain/issues and doing much better from that perspective. Working 2 jobs, has a stand at a [employer] as well as his  trucking business and reports good exertional capacity, with do difficulties with his ADLs No CP, SOB No dizzy spells, near syncope or syncope. He has had some fleeting infrequent palpitations that he denies any associated symptoms with these  He mentions an upcoming prostate bx   Device information Abbott dual chamber PPM implanted 05/18/1998, gen change July 2011, and again Sept  2022    Studies Reviewed: SABRA    EKG done today and reviewed by myself:  SR 64bpm, no pre-excitation, 1st degree AVblock , T changes similar to last  DEVICE interrogation done today and reviewed by myself Battery and lead measurements are good + AMS episodes Total burden <1% All available EGMs reviewed Back in December ~ 3 hours of Afib (previously seen by remotes) Otherwise appear mostly seconds to a few minutes One HVR episode is 1:1 ATach lasted , Oct 1 He is not dependent AP 4.3% VP 14%   04/01/2021: TTE 1. Left ventricular ejection fraction, by estimation, is 60 to 65%. The  left ventricle has normal function. The left ventricle has no regional  wall motion abnormalities. Left ventricular diastolic parameters are  consistent with Grade I diastolic  dysfunction (impaired relaxation). Global longitudinal strain attempted  but not reported in the setting of LV foreshortening.   2. Right ventricular systolic function is normal. The right ventricular  size is normal. Tricuspid regurgitation signal is inadequate for assessing  PA pressure.   3. The mitral valve is normal in structure. No evidence of mitral valve  regurgitation. No evidence of mitral stenosis.   4. The aortic valve is tricuspid. There is mild calcification of the  aortic valve. Aortic valve regurgitation is not visualized. No aortic  stenosis is present.   5. The inferior vena cava is normal in size with greater than 50%  respiratory variability, suggesting right atrial pressure of 3 mmHg.   Comparison(s): A prior study was performed on 08/08/2013. Prior images  reviewed side by side. Similar LVEF (prior is a contrasted study).    05/29/2020: stress myoview  Nuclear stress EF: 52%. The left ventricular ejection fraction is mildly decreased (45-54%). There was no ST segment deviation noted during stress. The study is normal. This is a low risk study.   Normal stress nuclear study with no ischemia or  infarction.  Gated ejection fraction 52% with normal wall motion.  Risk Assessment/Calculations:    Physical Exam:   VS:  There were no vitals taken for this visit.   Wt Readings from Last 3 Encounters:  09/11/24 234 lb (106.1 kg)  08/06/24 224 lb 9.6 oz (101.9 kg)  06/24/24 222 lb 10.6 oz (101 kg)    GEN: Well nourished, well developed in no acute distress NECK: No JVD; No carotid bruits CARDIAC: RRR, no murmurs, rubs, gallops RESPIRATORY:  CTA b/l without rales, wheezing or rhonchi  ABDOMEN: Soft, non-tender, non-distended EXTREMITIES: No edema; no calf tenderness or skin changes, No deformity   PPM site: is stable, no thinning, fluctuation, tethering  ASSESSMENT AND PLAN: .    PPM intact function no programming changes made  HTN No changes Deferred to his PMD  WPW by problem list ablated EKG is not pre-excited  4. PAFib/flutter (SCAF) Low burden, <1% CHA2DS2Vasc remains one for HTN No episodes leading into his Aug symptoms/hospital stay Discussed with him today Follow burden No medication changes   5. Pre-op evaluation AFTER I saw the patient, was sent request for clearance Pending BIOPSY, PROSTATE, RECTAL APPROACH, WITH US  GUIDANCE; CYSTOSCOPY, WITH RETROGRADE  PYELOGRAM; CYSTOSCOPY, WITH BIOPSY; TURBT (TRANSURETHRAL RESECTION OF BLADDER TUMOR)  EKG stable without significant change from prior No symptoms of CP, or exertional limitations were reported Described himself as active, busy without limitations Urological procedure not considered a high cardiac risk procedure RCRI score is zero (0.4%), low risk No particular pacemaker management for procedure below the umbilicus needed.      Dispo: remotes as usual, otherwise he would like to come back in 6 months to meet Dr. Almetta, sooner if needed  Signed, Charlies Macario Arthur, PA-C

## 2024-10-08 NOTE — Patient Instructions (Signed)
 Medication Instructions:   Your physician recommends that you continue on your current medications as directed. Please refer to the Current Medication list given to you today.  *If you need a refill on your cardiac medications before your next appointment, please call your pharmacy*   Lab Work: NONE ORDERED  TODAY    If you have labs (blood work) drawn today and your tests are completely normal, you will receive your results only by: MyChart Message (if you have MyChart) OR A paper copy in the mail If you have any lab test that is abnormal or we need to change your treatment, we will call you to review the results.  Testing/Procedures: NONE ORDERED  TODAY    Follow-Up: At [facility], you and your health needs are our priority.  As part of our continuing mission to provide you with exceptional heart care, our providers are all part of one team.  This team includes your primary Cardiologist (physician) and Advanced Practice Providers or APPs (Physician Assistants and Nurse Practitioners) who all work together to provide you with the care you need, when you need it.  Your next appointment:   6 month(s)  Provider:   Donnice Primus, MD    We recommend signing up for the patient portal called MyChart.  Sign up information is provided on this After Visit Summary.  MyChart is used to connect with patients for Virtual Visits (Telemedicine).  Patients are able to view lab/test results, encounter notes, upcoming appointments, etc.  Non-urgent messages can be sent to your provider as well.   To learn more about what you can do with MyChart, go to [email or web address].   Other Instructions

## 2024-10-08 NOTE — Telephone Encounter (Signed)
   Name: [name] Fields  DOB: July 28, 1961  MRN: [id number]  Primary Cardiologist: None  Chart reviewed as part of pre-operative protocol coverage. The patient has an upcoming visit scheduled with [name], PA on 10/08/24 at which time clearance can be addressed in case there are any issues that would impact surgical recommendations.  Prostate biopsy is not scheduled until 10/17/24 as below. I added preop FYI to appointment note so that provider is aware to address at time of outpatient visit.  Per office protocol the cardiology provider should forward their finalized clearance decision and recommendations regarding antiplatelet therapy to the requesting party below.    I will route this message as FYI to requesting party and remove this message from the preop box as separate preop APP input not needed at this time.   Please call with any questions.  [name] D Filicia Scogin, NP  10/08/2024, 2:49 PM

## 2024-10-08 NOTE — Patient Instructions (Addendum)
 Your procedure is scheduled on: THURSDAY 10/17/24 Report to the Registration Desk on the 1st floor of the [facility]. To find out your arrival time, please call [phone] between 1PM - 3PM on: [facility] 10/16/24 If your arrival time is 6:00 am, do not arrive before that time as the Medical Mall entrance doors do not open until 6:00 am.  REMEMBER: Instructions that are not followed completely may result in serious medical risk, up to and including death; or upon the discretion of your surgeon and anesthesiologist your surgery may need to be rescheduled.  Do not eat food after midnight the night before surgery.  No gum chewing or hard candies.   One week prior to surgery: Stop Anti-inflammatories (NSAIDS) such as Advil, Aleve, Ibuprofen, Motrin, Naproxen, Naprosyn and Aspirin based products such as Excedrin, Goody's Powder, BC Powder. Stop ANY OVER THE COUNTER supplements until after surgery.   You may however, continue to take Tylenol  if needed for pain up until the day of surgery.  Continue taking all of your other prescription medications up until the day of surgery.  ON THE DAY OF SURGERY ONLY TAKE THESE MEDICATIONS WITH SIPS OF WATER:  NONE  Use inhalers on the day of surgery and bring to the hospital.  Fleets enema or bowel prep as directed.   No Alcohol for 24 hours before or after surgery.  No Smoking including e-cigarettes for 24 hours before surgery.  No chewable tobacco products for at least 6 hours before surgery.  No nicotine patches on the day of surgery.  Do not use any recreational drugs for at least a week (preferably 2 weeks) before your surgery.  Please be advised that the combination of cocaine and anesthesia may have negative outcomes, up to and including death. If you test positive for cocaine, your surgery will be cancelled.   On the morning of surgery brush your teeth with toothpaste and water, you may rinse your mouth with mouthwash if you  wish. Do not swallow any toothpaste or mouthwash.   Use CHG Soap or wipes as directed on instruction sheet.  Do not wear jewelry, make-up, hairpins, clips or nail polish.  For welded (permanent) jewelry: bracelets, anklets, waist bands, etc.  Please have this removed prior to surgery.  If it is not removed, there is a chance that hospital personnel will need to cut it off on the day of surgery.  Do not wear lotions, powders, or perfumes.   Do not shave body hair from the neck down 48 hours before surgery.  Contact lenses, hearing aids and dentures may not be worn into surgery.  Do not bring valuables to the hospital. [facility] is not responsible for any missing/lost belongings or valuables.   Bring your C-PAP to the hospital in case you may have to spend the night.   Notify your doctor if there is any change in your medical condition (cold, fever, infection).  Wear comfortable clothing (specific to your surgery type) to the hospital.  After surgery, you can help prevent lung complications by doing breathing exercises.  Take deep breaths and cough every 1-2 hours. Your doctor may order a device called an Incentive Spirometer to help you take deep breaths. When coughing or sneezing, hold a pillow firmly against your incision with both hands. This is called "splinting." Doing this helps protect your incision. It also decreases belly discomfort.  If you are being admitted to the hospital overnight, leave your suitcase in the car. After surgery it may  be brought to your room.  In case of increased patient census, it may be necessary for you, the patient, to continue your postoperative care in the Same Day Surgery department.  If you are being discharged the day of surgery, you will not be allowed to drive home. You will need a responsible individual to drive you home and stay with you for 24 hours after surgery.   If you are taking public transportation, you will need to have a  responsible individual with you.  Please call the Pre-admissions Testing Dept. at [phone] if you have any questions about these instructions.  Surgery Visitation Policy:  Patients having surgery or a procedure may have two visitors.  Children under the age of 75 must have an adult with them who is not the patient.  Merchandiser, Retail to address health-related social needs:  [email or web address]                                                                                                       Preparing for Surgery with CHLORHEXIDINE  GLUCONATE (CHG) Soap  Chlorhexidine  Gluconate (CHG) Soap  o An antiseptic cleaner that kills germs and bonds with the skin to continue killing germs even after washing  o Used for showering the night before surgery and morning of surgery  Before surgery, you can play an important role by reducing the number of germs on your skin.  CHG (Chlorhexidine  gluconate) soap is an antiseptic cleanser which kills germs and bonds with the skin to continue killing germs even after washing.  Please do not use if you have an allergy to CHG or antibacterial soaps. If your skin becomes reddened/irritated stop using the CHG.  1. Shower the NIGHT BEFORE SURGERY with CHG soap.  2. If you choose to wash your hair, wash your hair first as usual with your normal shampoo.  3. After shampooing, rinse your hair and body thoroughly to remove the shampoo.  4. Use CHG as you would any other liquid soap. You can apply CHG directly to the skin and wash gently with a clean washcloth.  5. Apply the CHG soap to your body only from the neck down. Do not use on open wounds or open sores. Avoid contact with your eyes, ears, mouth, and genitals (private parts). Wash face and genitals (private parts) with your normal soap.  6. Wash thoroughly, paying special attention to the area where your surgery will be performed.  7. Thoroughly rinse your body with warm  water.  8. Do not shower/wash with your normal soap after using and rinsing off the CHG soap.  9. Do not use lotions, oils, etc., after showering with CHG.  10. Pat yourself dry with a clean towel.  11. Wear clean pajamas to bed the night before surgery.  12. Place clean sheets on your bed the night of your shower and do not sleep with pets.  13. Do not apply any deodorants/lotions/powders.  14. Please wear clean clothes to the hospital.  15. Remember to brush your teeth with your regular toothpaste.

## 2024-10-08 NOTE — Telephone Encounter (Signed)
   Pre-operative Risk Assessment    Patient Name: [name] Fields  DOB: May 01, 1961 MRN: [id number]   Date of last office visit: 09/05/23 [name], PAC Date of next office visit: 10/08/24 [name], [facility]   Request for Surgical Clearance    Procedure:  BIOPSY, PROSTATE, RECTAL APPROACH, WITH US  GUIDANCE; CYSTOSCOPY, WITH RETROGRADE PYELOGRAM; CYSTOSCOPY, WITH BIOPSY; TURBT (TRANSURETHRAL RESECTION OF BLADDER TUMOR)  Date of Surgery:  Clearance 10/17/24                                Surgeon:  DR. TWYLLA Surgeon's Group or Practice Name:  UROLOGY Phone number:  [phone] Fax number:  Return FAX not required. I will follow up in [facility]   Type of Clearance Requested:   - Medical    Type of Anesthesia:  General    Additional requests/questions:    Bonney Niels Jest   10/08/2024, 2:42 PM    Subject: Request for pre-operative cardiac clearance    Request for pre-operative cardiac clearance:   1. What type of surgery is being performed? BIOPSY, PROSTATE, RECTAL APPROACH, WITH US  GUIDANCE; CYSTOSCOPY, WITH RETROGRADE PYELOGRAM; CYSTOSCOPY, WITH BIOPSY; TURBT (TRANSURETHRAL RESECTION OF BLADDER TUMOR)  2. When is this surgery scheduled? 10/17/2024   3. Type of clearance being requested (medical, pharmacy, both)? MEDICAL   4. Are there any medications that need to be held prior to surgery? NONE  5. Practice name and name of physician performing surgery? Performing surgeon: Dr. Glendia Twylla, MD Requesting clearance: Dorise Pereyra, FNP-C     6. Anesthesia type (none, local, MAC, general)? GENERAL  7. What is the office phone and fax number?   Phone: [phone] Fax: Return FAX not required. I will follow up in [facility].  ATTENTION: Unable to create telephone message as per your standard workflow. Directed by [facility] providers to send requests for cardiac clearance to this pool for appropriate distribution to provider covering pre-operative clearances.  Dorise Pereyra,  MSN, APRN, FNP-C, CEN [facility] Peri-operative Services Nurse Practitioner Phone: [phone] 10/08/24 2:04 PM

## 2024-10-09 ENCOUNTER — Encounter: Payer: Self-pay | Admitting: Urgent Care

## 2024-10-09 ENCOUNTER — Encounter: Payer: Self-pay | Admitting: Student in an Organized Health Care Education/Training Program

## 2024-10-09 ENCOUNTER — Inpatient Hospital Stay: Admission: RE | Admit: 2024-10-09 | Discharge: 2024-10-09 | Attending: Urology | Admitting: Urology

## 2024-10-09 DIAGNOSIS — Z01818 Encounter for other preprocedural examination: Secondary | ICD-10-CM

## 2024-10-09 DIAGNOSIS — R972 Elevated prostate specific antigen [PSA]: Secondary | ICD-10-CM | POA: Insufficient documentation

## 2024-10-09 DIAGNOSIS — Z01812 Encounter for preprocedural laboratory examination: Secondary | ICD-10-CM | POA: Insufficient documentation

## 2024-10-09 DIAGNOSIS — N401 Enlarged prostate with lower urinary tract symptoms: Secondary | ICD-10-CM | POA: Insufficient documentation

## 2024-10-09 LAB — BASIC METABOLIC PANEL WITH GFR
Anion gap: 12 (ref 5–15)
BUN: 9 mg/dL (ref 8–23)
CO2: 26 mmol/L (ref 22–32)
Calcium: 9.9 mg/dL (ref 8.9–10.3)
Chloride: 103 mmol/L (ref 98–111)
Creatinine, Ser: 0.97 mg/dL (ref 0.61–1.24)
GFR, Estimated: >60 mL/min (ref >60)
Glucose, Bld: 82 mg/dL (ref 70–99)
Potassium: 4.1 mmol/L (ref 3.5–5.1)
Sodium: 141 mmol/L (ref 135–145)

## 2024-10-09 NOTE — Progress Notes (Signed)
 PERIOPERATIVE PRESCRIPTION FOR IMPLANTED CARDIAC DEVICE PROGRAMMING  Patient Information: Name:  [name] Fields  DOB:  09/28/1961  MRN:  [id number]    Planned Procedure: BIOPSY, PROSTATE, RECTAL APPROACH, WITH US  GUIDANCE; CYSTOSCOPY, WITH RETROGRADE PYELOGRAM; CYSTOSCOPY, WITH BIOPSY; TURBT (TRANSURETHRAL RESECTION OF BLADDER TUMOR)     Surgeon:  Dr. Glendia Barba, MD  Requesting device clearance: Dorise Pereyra, FNP-C  Date of Procedure:  10/17/2024  Cautery will be used.   Device Information:  Clinic EP Physician:  Donnice Primus, MD  Device Type:  Pacemaker Manufacturer and Phone #:  [employer]: [phone] Pacemaker Dependent?:  No. Date of Last Device Check:  10/08/24 Normal Device Function?:  Yes.    Electrophysiologist's Recommendations:  Have magnet available. Provide continuous ECG monitoring when magnet is used or reprogramming is to be performed.  Procedure should not interfere with device function.  No device programming or magnet placement needed.  Per Device Clinic Standing Orders, [place], [place]  7:14 AM 10/09/2024

## 2024-10-10 ENCOUNTER — Ambulatory Visit: Payer: Self-pay | Admitting: Student in an Organized Health Care Education/Training Program

## 2024-10-11 ENCOUNTER — Encounter: Payer: Self-pay | Admitting: Urology

## 2024-10-11 NOTE — Progress Notes (Signed)
 Perioperative / Anesthesia Services  Pre-Admission Testing Clinical Review / Pre-Operative Anesthesia Consult  Date: 10/11/2024  PATIENT DEMOGRAPHICS: Name: [name] Fields DOB: [birth year] MRN:   [id number]  Note: Available PAT nursing documentation and vital signs have been reviewed. Clinical nursing staff has updated patient's PMH/PSHx, current medication list, and drug allergies/intolerances to ensure complete and comprehensive history available to assist care teams in MDM as it pertains to the aforementioned surgical procedure and anticipated anesthetic course. Extensive review of available clinical information personally performed. Nursing documentation reviewed. [place] PMH and PSHx updated with any diagnoses and/or procedures that I have knowledge of that may have been inadvertently omitted during his intake with the pre-admission testing department's nursing staff.  PLANNED SURGICAL PROCEDURE(S):   Case: [id number] Date/Time: 10/17/24 0715   Procedures:      BIOPSY, PROSTATE, RECTAL APPROACH, WITH US  GUIDANCE     CYSTOSCOPY     CYSTOSCOPY, WITH RETROGRADE PYELOGRAM (Bilateral)     CYSTOSCOPY, WITH BIOPSY     TURBT (TRANSURETHRAL RESECTION OF BLADDER TUMOR)   Anesthesia type: Choice   Diagnosis:      Elevated PSA [R97.20]     Gross hematuria [R31.0]   Pre-op diagnosis: Elevated Prostate Specific Antigen, Gross Hematuria   Location: [facility] OR ROOM 10 / [facility]   Surgeons: Twylla Glendia BROCKS, MD        CLINICAL DISCUSSION: [name] Fields is a 63 y.o. male who is submitted for pre-surgical anesthesia review and clearance prior to him undergoing the above procedure. Patient is a Current Smoker (24 pack years). Pertinent PMH includes: CAD, NICM, diastolic dysfunction, WPW (s/p RFCA), high-grade second-degree AV block (s/p PPM), atrial tachycardia, atrial flutter, NSVT, atypical chest pain, HTN, HLD, OSAH (on nocturnal PAP therapy), GERD (no daily Tx), hiatal  hernia, cervical DDD (s/p anterior and posterior C3-C6 fusion), lumbar DDD.  Patient is followed by cardiology Robyne, MD). He was last seen in the cardiology clinic on 10/08/2024; notes reviewed. At the time of his clinic visit, patient doing well overall from a cardiovascular perspective. Patient denied any chest pain, shortness of breath, PND, orthopnea, palpitations, significant peripheral edema, weakness, fatigue, vertiginous symptoms, or presyncope/syncope. Patient with a past medical history significant for cardiovascular diagnoses. Documented physical exam was grossly benign, providing no evidence of acute exacerbation and/or decompensation of the patient's known cardiovascular conditions.  Patient with a history of Wolff-Parkinson-White syndrome.  He underwent RFCA procedure (ablation) in approximately 1999.  Patient with a history of high-grade second-degree AV block.  He underwent dual-chamber pacemaker placement on 05/18/1998.  Pulse generator was changed out in 04/2010 and on 07/07/2021.  Patient has an St. Jude Assurity DR RF MRI device in place that is regularly interrogated by his primary electrophysiology team.  Most recent interrogation was on 10/08/2024, at which time device was noted to be functioning properly.  Most recent myocardial perfusion imaging study was performed on 05/28/2020 revealing a reduced left ventricular systolic function with an EF of 45-54%.  There were no regional wall motion abnormalities.  No artifact or left ventricular cavity size enlargement appreciated on review of imaging. SPECT images demonstrated no evidence of stress-induced myocardial ischemia or arrhythmia; no scintigraphic evidence of scar.  TID ratio = 0.96 (normal range </= 1.2). Study determined to be normal and low risk.  Most recent TTE performed on 04/01/2021 revealed a normal left ventricular systolic function with an EF of 60-65%. There was no significant LVH.  There were no regional wall motion  abnormalities. Left ventricular diastolic Doppler parameters consistent with abnormal relaxation (G1DD). Right ventricular size and function normal with a TAPSE measuring 1.8 cm  (normal range >/= 1.6 cm).  AR jet inadequate for assessing PA pressures.  Mild calcification of the aortic valve was not visualized.  There was trivial tricuspid valve regurgitation.  All transvalvular gradients were noted to be normal providing no evidence of hemodynamically significant valvular stenosis. Aorta normal in size with no evidence of ectasia or aneurysmal dilatation.  Coronary CTA was performed on 02/16/2024 that demonstrated an Agatston coronary artery calcium score of 71. This placed patient in the 57th percentile for age, sex, and race matched controls. Calcium depositions noted to be isolated mainly in the proximal RCA, proximal LM, mid LAD, and mid LCx distributions.  Total plaque volume is 304 mm acute (calcified 43 mm; noncalcified 261 mm acute) study demonstrated normal coronary origin with RIGHT dominance.  Pulmonary artery and aorta noted to be of normal size/caliber with no evidence of ectasia.  Blood pressure reasonably controlled at 142/70 mmHg on currently prescribed diuretic (HCTZ) monotherapy. Patient is not on any type of lipid-lowering therapies for his HLD diagnosis and further ASCVD prevention. Patient is not diabetic.  Patient does have an OSAH diagnosis and is reported to be sometimes compliant with prescribed nocturnal PAP therapy. Patient is able to complete all of his  ADL/IADLs without cardiovascular limitation.  Per the DASI, patient is able to achieve at least 4 METS of physical activity without experiencing any significant degree of angina/anginal equivalent symptoms. No changes were made to his medication regimen during his visit with cardiology.  Patient scheduled to follow-up with outpatient cardiology in 6 months or sooner if needed.  [name] Fields is scheduled for an elective BIOPSY,  PROSTATE, RECTAL APPROACH, WITH US  GUIDANCE; CYSTOSCOPY; CYSTOSCOPY, WITH RETROGRADE PYELOGRAM (Bilateral); CYSTOSCOPY, WITH BIOPSY; TURBT (TRANSURETHRAL RESECTION OF BLADDER TUMOR) on 10/17/2024 with Dr. Glendia Fields Barba, MD. Given patient's past medical history significant for cardiovascular diagnoses, presurgical cardiac clearance was sought by the PAT team. Per cardiology, EKG stable without significant change from prior. No symptoms of CP, or exertional limitations were reported. Described himself as active, busy without limitations. Urological procedure not considered a high cardiac risk procedure. RCRI score is zero (0.4%), low risk. No particular pacemaker management for procedure below the umbilicus needed.  In review of his medication reconciliation, the patient is not noted to be taking any type of anticoagulation or antiplatelet therapies that would need to be held during his perioperative course.  Patient denies previous perioperative complications with anesthesia in the past. In review his EMR, it is noted that patient underwent a general anesthetic course at [facility] (ASA III) in 12/2020 without documented complications.   MOST RECENT VITAL SIGNS:    10/08/2024    2:49 PM 10/08/2024    1:16 PM 09/17/2024    2:13 PM  Vitals with BMI  Height 6' 1 6' 1   Weight 226 lbs 235 lbs   BMI 29.82 31.01   Systolic 142  159  Diastolic 70  97  Pulse 64  61   PROVIDERS/SPECIALISTS: NOTE: Primary physician provider listed below. Patient may have been seen by APP or partner within same practice.   PROVIDER ROLE / SPECIALTY LAST [name] Barba Glendia JAYSON, MD Urology (Surgeon) 09/11/2024  Early, Camie BRAVO, NP Primary Care Provider \\10 /04/2024  Fernande Standing, MD Cardiology 10/08/2024   ALLERGIES: Allergies[1]  CURRENT HOME MEDICATIONS:  Ascorbic Acid (VITAMIN C)  1000 MG tablet   hydrochlorothiazide  (HYDRODIURIL ) 25 MG tablet   Multiple Vitamin (MULTIVITAMIN WITH MINERALS) TABS  tablet   naphazoline-glycerin (CLEAR EYES REDNESS RELIEF) 0.012-0.25 % SOLN   Vitamin D , Ergocalciferol , (DRISDOL ) 1.25 MG (50000 UNIT) CAPS capsule   HISTORY: Past Medical History:  Diagnosis Date   Atrial flutter (HCC)    Atrial tachycardia    Atypical chest pain    CAD (coronary artery disease)    Cardiomyopathy, nonischemic (HCC)    Elevated PSA    GERD (gastroesophageal reflux disease)    Hiatal hernia    High-grade second-degree heart block     a.) s/p PPM placement 04/1998   HLD (hyperlipidemia)    Hypertension    NSVT (nonsustained ventricular tachycardia) (HCC)    OSA on CPAP    Presence of permanent cardiac pacemaker 05/18/1998   a.) dual chamber PPM placed 05/18/1998 for HG 2nd degree AVB; b.) pulse generator changed 04/2010; c.) pulse generator changed 07/07/2021 (Assurity DR RF MRI)   WPW (Wolff-Parkinson-White syndrome)    a.) s/p RFCA ~ 1999   Past Surgical History:  Procedure Laterality Date   CARDIAC DEFIBRILLATOR PLACEMENT  2011   St Jude -- [employer] RF   CARPAL TUNNEL RELEASE Left 08/18/2020   Procedure: CARPAL TUNNEL RELEASE;  Surgeon: Murrell Kuba, MD;  Location: [facility];  Service: Orthopedics;  Laterality: Left;  IV REGIONAL FOREARM BLOCK   CARPAL TUNNEL RELEASE Right 12/01/2020   Procedure: CARPAL TUNNEL RELEASE;  Surgeon: Murrell Kuba, MD;  Location: [facility];  Service: Orthopedics;  Laterality: Right;  IV REGIONAL FOREARM BLOCK   PACEMAKER INSERTION  1999   explanted 2011   PPM GENERATOR CHANGEOUT N/A 07/07/2021   Procedure: PPM GENERATOR CHANGEOUT;  Surgeon: Fernande Elspeth BROCKS, MD;  Location: [facility] INVASIVE CV LAB;  Service: Cardiovascular;  Laterality: N/A;   Family History  Problem Relation Age of Onset   Breast cancer Mother    Heart disease Mother    Heart Problems Mother        pacemaker   Prostate cancer Father    Hypertension Father    Social History   Tobacco Use   Smoking status: Every Day    Current  packs/day: 0.50    Average packs/day: 0.5 packs/day for 47.9 years (24.0 ttl pk-yrs)    Types: Cigarettes    Start date: 1978   Smokeless tobacco: Never   Tobacco comments:    Pt has stopped and satated back  Substance Use Topics   Alcohol use: Yes    Alcohol/week: 7.0 standard drinks of alcohol    Types: 7 Standard drinks or equivalent per week    Comment: social   LABS:  Lab Results  Component Value Date   WBC 7.1 08/06/2024   HGB 14.7 08/06/2024   HCT 44.0 08/06/2024   MCV 89 08/06/2024   PLT 298 08/06/2024   Lab Results  Component Value Date   NA 141 10/09/2024   CL 103 10/09/2024   K 4.1 10/09/2024   CO2 26 10/09/2024   BUN 9 10/09/2024   CREATININE 0.97 10/09/2024   GFRNONAA >60 10/09/2024   CALCIUM 9.9 10/09/2024   ALBUMIN 4.3 08/06/2024   GLUCOSE 82 10/09/2024    ECG: Date: 10/08/2024  Time ECG obtained: 1458 PM Rate: 64 bpm Rhythm: Sinus rhythm with first-degree AV block Axis (leads I and aVF): normal Intervals: PR 212 ms. QRS 128 ms. QTc 385 ms. ST segment and T wave changes: Inferior T  wave inversions similar to previous.   Evidence of a possible, age undetermined, prior infarct:  No Comparison: Similar to previous tracing obtained on 09/05/2023   IMAGING / PROCEDURES: CT LUMBAR SPINE W CONTRAST performed on 09/17/2024 Progressive, severe multifactorial spinal and neural foraminal stenosis at L4-5. Progressive, moderate right and severe left neural foraminal stenosis at L5-S1 Mild lateral recess and mild-to-moderate neural foraminal stenosis at L2-3 and L3-4. Aortic atherosclerosis  CT CERVICAL SPINE W CONTRAST performed on 06/24/2024 Progressive, asymmetrically severe right C1-2 arthropathy. Unchanged disc and facet degeneration elsewhere, including moderate spinal stenosis at C6-7, mild spinal stenosis at C7-T1, and mild-to-moderate spinal stenosis at T1-2. Moderate-to-severe multilevel neural foraminal stenosis as above. Solid C3-C6 anterior  and posterior fusion.  CT CORONARY MORPH W/CTA COR W/SCORE W/CA W/CM &/OR WO/CM performed on 02/16/2024 10 mm lingular nodule stable in appearance from the prior exam of 1 month previous. Follow-up exam in 3 months is recommended to assess for stability. 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 While overall mild disease, there is significant noncalcified plaque and positive remodeling in distal left main with stenosis of almost 50% visually. Recommend aggressive medical management, consider cath if there are high risk clinical symptoms given location of stenosis. Coronary calcium score of 71. This was 57th percentile for age-, sex-, and race- matched controls. Total plaque volume 304 mm3 which is 43rd percentile for age- and sex- matched controls (calcified plaque 43 mm3; noncalcified plaque 261 mm3). Total plaque volume is severe. Normal coronary origin with right dominance.  TRANSTHORACIC ECHOCARDIOGRAM performed on 04/01/2021 Left ventricular ejection fraction, by estimation, is 60 to 65%. The left ventricle has normal function. The left ventricle has no regional  wall motion abnormalities. Left ventricular diastolic parameters are  consistent with Grade I diastolic dysfunction (impaired relaxation). Global longitudinal strain attempted but not reported in the setting of LV foreshortening.  Right ventricular systolic function is normal. The right ventricular size is normal. Tricuspid regurgitation signal is inadequate for assessing PA pressure.  The mitral valve is normal in structure. No evidence of mitral valve regurgitation. No evidence of mitral stenosis.  The aortic valve is tricuspid. There is mild calcification of the aortic valve. Aortic valve regurgitation is not visualized. No aortic stenosis is present.  The inferior vena cava is normal in size with greater than 50% respiratory variability, suggesting right atrial pressure of 3 mmHg.   MYOCARDIAL PERFUSION IMAGING STUDY (LEXISCAN )  performed on 05/28/2020 Nuclear stress EF: 52% The left ventricular ejection fraction is mildly decreased (45-54%). There was no ST segment deviation noted during stress. The study is normal low risk study.   IMPRESSION AND PLAN: BURRIS MATHERNE has been referred for pre-anesthesia review and clearance prior to him undergoing the planned anesthetic and procedural courses. Available labs, pertinent testing, and imaging results were personally reviewed by me in preparation for upcoming operative/procedural course. [facility] medical record has been updated following extensive record review and patient interview with PAT staff.   is patient has been appropriately cleared by cardiology with an overall LOW risk of patient experiencing significant perioperative cardiovascular complications. here at [facility]. Completed perioperative prescription for cardiac device management documentation completed by primary cardiology team and placed on patient's chart for review by the surgical/anesthetic team on the day of his procedure. Electrophysiology indicating that procedure should not interfere with the planned surgical procedure. EP advising that patient not pacemaker dependent. Beyond normal perioperative cardiovascular monitoring, there are no recommendations from his electrophysiology team  that would prompt further discussions/recommendations from an electronics engineer.   Based on clinical review performed today (10/11/2024), barring any significant acute changes in the patient's overall condition, it is anticipated that he will be able to proceed with the planned surgical intervention. Any acute changes in clinical condition may necessitate his procedure being postponed and/or cancelled. Patient will meet with anesthesia team (MD and/or CRNA) on the day of his procedure for preoperative evaluation/assessment. Questions regarding anesthetic course will be fielded at that  time.   Pre-surgical instructions were reviewed with the patient during his PAT appointment, and questions were fielded to satisfaction by PAT clinical staff. He has been instructed on which medications that he will need to hold prior to surgery, as well as the ones that have been deemed safe/appropriate to take on the day of his procedure. As part of the general education provided by PAT, patient made aware both verbally and in writing, that he would need to abstain from the use of any illegal substances during his perioperative course. He was advised that failure to follow the provided instructions could necessitate case cancellation or result in serious perioperative complications up to and including death. Patient encouraged to contact PAT and/or his surgeon's office to discuss any questions or concerns that may arise prior to surgery; verbalized understanding.   Dorise Pereyra, MSN, APRN, FNP-C, CEN [facility]  Perioperative Services Nurse Practitioner Phone: [phone] Fax: [phone] 10/11/2024 10:28 AM  NOTE: This note has been prepared using Dragon dictation software. Despite my best ability to proofread, there is always the potential that unintentional transcriptional errors may still occur from this process.     [1]  Allergies Allergen Reactions   Lisinopril  Swelling and Anaphylaxis    Lip swelling

## 2024-10-16 ENCOUNTER — Ambulatory Visit (HOSPITAL_BASED_OUTPATIENT_CLINIC_OR_DEPARTMENT_OTHER): Admission: RE | Admit: 2024-10-16 | Discharge: 2024-10-16 | Attending: Urology | Admitting: Urology

## 2024-10-16 DIAGNOSIS — R310 Gross hematuria: Secondary | ICD-10-CM | POA: Insufficient documentation

## 2024-10-16 MED ORDER — CHLORHEXIDINE GLUCONATE 0.12 % MT SOLN
15.0000 mL | Freq: Once | OROMUCOSAL | Status: AC
  Administered 2024-10-17: 07:00:00 15 mL via OROMUCOSAL

## 2024-10-16 MED ORDER — LACTATED RINGERS IV SOLN: INTRAVENOUS | Status: DC

## 2024-10-16 MED ORDER — IOHEXOL 300 MG/ML  SOLN
100.0000 mL | Freq: Once | INTRAMUSCULAR | Status: AC | PRN
  Administered 2024-10-16: 14:00:00 100 mL via INTRAVENOUS

## 2024-10-16 MED ORDER — CEFTRIAXONE SODIUM 1 G IJ SOLR
1.0000 g | INTRAMUSCULAR | Status: AC
  Administered 2024-10-17: 07:00:00 1 g via INTRAVENOUS
  Filled 2024-10-16: qty 10

## 2024-10-16 MED ORDER — ORAL CARE MOUTH RINSE: 15.0000 mL | Freq: Once | OROMUCOSAL | Status: AC

## 2024-10-17 ENCOUNTER — Other Ambulatory Visit: Payer: Self-pay | Admitting: Urology

## 2024-10-17 ENCOUNTER — Encounter: Payer: Self-pay | Admitting: Urgent Care

## 2024-10-17 ENCOUNTER — Ambulatory Visit

## 2024-10-17 ENCOUNTER — Other Ambulatory Visit: Payer: Self-pay

## 2024-10-17 ENCOUNTER — Encounter: Admission: RE | Disposition: A | Payer: Self-pay | Attending: Urology

## 2024-10-17 ENCOUNTER — Ambulatory Visit
Admission: RE | Admit: 2024-10-17 | Discharge: 2024-10-17 | Disposition: A | Payer: Self-pay | Source: Ambulatory Visit | Attending: Urology | Admitting: Urology

## 2024-10-17 ENCOUNTER — Encounter: Payer: Self-pay | Admitting: Urology

## 2024-10-17 ENCOUNTER — Ambulatory Visit: Payer: Self-pay | Admitting: Urgent Care

## 2024-10-17 ENCOUNTER — Ambulatory Visit
Admission: RE | Admit: 2024-10-17 | Discharge: 2024-10-17 | Disposition: A | Payer: Self-pay | Attending: Urology | Admitting: Urology

## 2024-10-17 DIAGNOSIS — I4719 Other supraventricular tachycardia: Secondary | ICD-10-CM | POA: Diagnosis not present

## 2024-10-17 DIAGNOSIS — Z8249 Family history of ischemic heart disease and other diseases of the circulatory system: Secondary | ICD-10-CM | POA: Insufficient documentation

## 2024-10-17 DIAGNOSIS — R972 Elevated prostate specific antigen [PSA]: Secondary | ICD-10-CM | POA: Diagnosis present

## 2024-10-17 DIAGNOSIS — E785 Hyperlipidemia, unspecified: Secondary | ICD-10-CM | POA: Insufficient documentation

## 2024-10-17 DIAGNOSIS — K219 Gastro-esophageal reflux disease without esophagitis: Secondary | ICD-10-CM | POA: Diagnosis not present

## 2024-10-17 DIAGNOSIS — K449 Diaphragmatic hernia without obstruction or gangrene: Secondary | ICD-10-CM | POA: Diagnosis not present

## 2024-10-17 DIAGNOSIS — I700 Atherosclerosis of aorta: Secondary | ICD-10-CM | POA: Insufficient documentation

## 2024-10-17 DIAGNOSIS — R310 Gross hematuria: Secondary | ICD-10-CM | POA: Insufficient documentation

## 2024-10-17 DIAGNOSIS — I456 Pre-excitation syndrome: Secondary | ICD-10-CM | POA: Insufficient documentation

## 2024-10-17 DIAGNOSIS — F17210 Nicotine dependence, cigarettes, uncomplicated: Secondary | ICD-10-CM | POA: Diagnosis not present

## 2024-10-17 DIAGNOSIS — G4733 Obstructive sleep apnea (adult) (pediatric): Secondary | ICD-10-CM | POA: Diagnosis not present

## 2024-10-17 DIAGNOSIS — Z95810 Presence of automatic (implantable) cardiac defibrillator: Secondary | ICD-10-CM | POA: Insufficient documentation

## 2024-10-17 DIAGNOSIS — M5030 Other cervical disc degeneration, unspecified cervical region: Secondary | ICD-10-CM | POA: Diagnosis not present

## 2024-10-17 DIAGNOSIS — M51369 Other intervertebral disc degeneration, lumbar region without mention of lumbar back pain or lower extremity pain: Secondary | ICD-10-CM | POA: Diagnosis not present

## 2024-10-17 DIAGNOSIS — I428 Other cardiomyopathies: Secondary | ICD-10-CM | POA: Insufficient documentation

## 2024-10-17 DIAGNOSIS — I441 Atrioventricular block, second degree: Secondary | ICD-10-CM | POA: Diagnosis not present

## 2024-10-17 DIAGNOSIS — I2510 Atherosclerotic heart disease of native coronary artery without angina pectoris: Secondary | ICD-10-CM | POA: Insufficient documentation

## 2024-10-17 HISTORY — PX: CYSTOSCOPY: SHX5120

## 2024-10-17 HISTORY — PX: PROSTATE BIOPSY: SHX241

## 2024-10-17 SURGERY — BIOPSY, PROSTATE, RECTAL APPROACH, WITH US GUIDANCE
Anesthesia: Choice

## 2024-10-17 MED ORDER — SODIUM CHLORIDE 0.9 % IR SOLN
Status: DC | PRN
  Administered 2024-10-17: 08:00:00 3000 mL

## 2024-10-17 MED ORDER — GLYCOPYRROLATE 0.2 MG/ML IJ SOLN
INTRAMUSCULAR | Status: DC | PRN
  Administered 2024-10-17: 08:00:00 .2 mg via INTRAVENOUS

## 2024-10-17 MED ORDER — EPHEDRINE SULFATE-NACL 50-0.9 MG/10ML-% IV SOSY
PREFILLED_SYRINGE | INTRAVENOUS | Status: DC | PRN
  Administered 2024-10-17: 08:00:00 10 mg via INTRAVENOUS

## 2024-10-17 MED ORDER — FENTANYL CITRATE (PF) 100 MCG/2ML IJ SOLN
INTRAMUSCULAR | Status: DC | PRN
  Administered 2024-10-17: 08:00:00 50 ug via INTRAVENOUS

## 2024-10-17 MED ORDER — ACETAMINOPHEN 10 MG/ML IV SOLN
INTRAVENOUS | Status: DC | PRN
  Administered 2024-10-17: 08:00:00 1000 mg via INTRAVENOUS

## 2024-10-17 MED ORDER — PROPOFOL 10 MG/ML IV BOLUS
INTRAVENOUS | Status: DC | PRN
  Administered 2024-10-17: 08:00:00 150 mg via INTRAVENOUS

## 2024-10-17 MED ORDER — SUGAMMADEX SODIUM 200 MG/2ML IV SOLN
INTRAVENOUS | Status: DC | PRN
  Administered 2024-10-17: 08:00:00 200 mg via INTRAVENOUS

## 2024-10-17 MED ORDER — MIDAZOLAM HCL (PF) 2 MG/2ML IJ SOLN
INTRAMUSCULAR | Status: DC | PRN
  Administered 2024-10-17: 07:00:00 2 mg via INTRAVENOUS

## 2024-10-17 MED ORDER — LACTATED RINGERS IV SOLN: INTRAVENOUS | Status: DC | PRN

## 2024-10-17 MED ORDER — ROCURONIUM BROMIDE 100 MG/10ML IV SOLN
INTRAVENOUS | Status: DC | PRN
  Administered 2024-10-17: 08:00:00 10 mg via INTRAVENOUS
  Administered 2024-10-17: 08:00:00 40 mg via INTRAVENOUS

## 2024-10-17 MED ORDER — STERILE WATER FOR IRRIGATION IR SOLN
Status: DC | PRN
  Administered 2024-10-17: 08:00:00 500 mL

## 2024-10-17 MED ORDER — DEXAMETHASONE SOD PHOSPHATE PF 10 MG/ML IJ SOLN
INTRAMUSCULAR | Status: DC | PRN
  Administered 2024-10-17: 08:00:00 10 mg via INTRAVENOUS

## 2024-10-17 MED ORDER — LIDOCAINE HCL (CARDIAC) PF 100 MG/5ML IV SOSY
PREFILLED_SYRINGE | INTRAVENOUS | Status: DC | PRN
  Administered 2024-10-17: 08:00:00 100 mg via INTRAVENOUS

## 2024-10-17 MED ORDER — ACETAMINOPHEN 10 MG/ML IV SOLN
INTRAVENOUS | Status: AC
  Filled 2024-10-17: qty 100

## 2024-10-17 MED ORDER — PROPOFOL 1000 MG/100ML IV EMUL
INTRAVENOUS | Status: AC
  Filled 2024-10-17: qty 100

## 2024-10-17 MED ORDER — PHENYLEPHRINE 80 MCG/ML (10ML) SYRINGE FOR IV PUSH (FOR BLOOD PRESSURE SUPPORT)
PREFILLED_SYRINGE | INTRAVENOUS | Status: DC | PRN
  Administered 2024-10-17 (×2): 80 ug via INTRAVENOUS

## 2024-10-17 MED ORDER — SUCCINYLCHOLINE CHLORIDE 200 MG/10ML IV SOSY
PREFILLED_SYRINGE | INTRAVENOUS | Status: DC | PRN
  Administered 2024-10-17: 08:00:00 100 mg via INTRAVENOUS

## 2024-10-17 MED ORDER — ONDANSETRON HCL 4 MG/2ML IJ SOLN
INTRAMUSCULAR | Status: DC | PRN
  Administered 2024-10-17 (×2): 4 mg via INTRAVENOUS

## 2024-10-17 MED ORDER — MIDAZOLAM HCL 2 MG/2ML IJ SOLN
INTRAMUSCULAR | Status: AC
  Filled 2024-10-17: qty 2

## 2024-10-17 MED ORDER — CHLORHEXIDINE GLUCONATE 0.12 % MT SOLN
OROMUCOSAL | Status: AC
  Filled 2024-10-17: qty 15

## 2024-10-17 MED ADMIN — Sodium Phosphates - Enema: 1 | RECTAL | @ 07:00:00 | NDC 00132020140

## 2024-10-17 MED FILL — Fentanyl Citrate Preservative Free (PF) Inj 100 MCG/2ML: INTRAMUSCULAR | Qty: 2 | Status: AC

## 2024-10-17 SURGICAL SUPPLY — 31 items
BAG DRAIN SIEMENS DORNER NS (MISCELLANEOUS) ×1 IMPLANT
BAG URINE DRAIN 2000ML AR STRL (UROLOGICAL SUPPLIES) ×1 IMPLANT
BRUSH SCRUB EZ 1% IODOPHOR (MISCELLANEOUS) ×1 IMPLANT
BRUSH SCRUB EZ 4% CHG (MISCELLANEOUS) ×1 IMPLANT
CATH FOL 2WAY LX 18X30 (CATHETERS) ×1 IMPLANT
CATH URETL OPEN END 6X70 (CATHETERS) ×1 IMPLANT
COVER MAYO STAND STRL (DRAPES) ×1 IMPLANT
DRAPE UTILITY 15X26 TOWEL STRL (DRAPES) ×1 IMPLANT
DRSG TELFA 3X4 N-ADH STERILE (GAUZE/BANDAGES/DRESSINGS) ×1 IMPLANT
ELECTRODE LOOP 22F BIPOLAR SML (ELECTROSURGICAL) IMPLANT
ELECTRODE REM PT RTRN 9FT ADLT (ELECTROSURGICAL) ×1 IMPLANT
GLOVE BIOGEL PI IND STRL 7.5 (GLOVE) ×1 IMPLANT
GOWN STRL REUS W/ TWL LRG LVL3 (GOWN DISPOSABLE) ×2 IMPLANT
GOWN STRL REUS W/ TWL XL LVL3 (GOWN DISPOSABLE) ×1 IMPLANT
GOWN STRL REUS W/TWL XL LVL4 (GOWN DISPOSABLE) ×1 IMPLANT
GUIDEWIRE STR DUAL SENSOR (WIRE) ×1 IMPLANT
INST BIOPSY MAXCORE 18GX25 (NEEDLE) ×1 IMPLANT
KIT TURNOVER CYSTO (KITS) ×1 IMPLANT
LOOP CUT BIPOLAR 24F LRG (ELECTROSURGICAL) IMPLANT
NDL SAFETY ECLIP 18X1.5 (MISCELLANEOUS) ×1 IMPLANT
PACK CYSTO AR (MISCELLANEOUS) ×1 IMPLANT
PAD PREP 24X41 OB/GYN DISP (PERSONAL CARE ITEMS) ×1 IMPLANT
SET CYSTO IRRIGATION (SET/KITS/TRAYS/PACK) ×1 IMPLANT
SET IRRIG Y-TYPE CYSTO (SET/KITS/TRAYS/PACK) ×1 IMPLANT
SOL .9 NS 3000ML IRR UROMATIC (IV SOLUTION) ×2 IMPLANT
SOLN STERILE WATER 500 ML (IV SOLUTION) ×1 IMPLANT
SOLN STERILE WATER BTL 1000 ML (IV SOLUTION) ×1 IMPLANT
SURGILUBE 2OZ TUBE FLIPTOP (MISCELLANEOUS) ×1 IMPLANT
SYR 10ML LL (SYRINGE) ×1 IMPLANT
SYRINGE TOOMEY IRRIG 70ML (MISCELLANEOUS) ×1 IMPLANT
WATER STERILE IRR 3000ML UROMA (IV SOLUTION) ×1 IMPLANT

## 2024-10-17 NOTE — Discharge Instructions (Signed)
 Prostate Biopsy Discharge Instructions  No strenuous activity x 48 hours Blood from the rectum is normal and typically resolves within 12 hours Blood in the urine is normal and can persist intermittently for 2-3 weeks Blood in the semen is normal and can take 6-8 weeks to clear Contact our office at [phone] for excessive bleeding from the rectum or urine; inability to urinate or fever >101 degrees  Cystoscopy Discharge Instructions  Following a cystoscopy, a catheter (a flexible rubber tube) is sometimes left in place to empty the bladder. This may cause some discomfort or a feeling that you need to urinate. Your doctor determines the period of time that the catheter will be left in place. You may have bloody urine for two to three days (Call your doctor if the amount of bleeding increases or does not subside).  You may pass blood clots in your urine, especially if you had a biopsy. It is not unusual to pass small blood clots and have some bloody urine a couple of weeks after your cystoscopy. Again, call your doctor if the bleeding does not subside. You may have: Dysuria (painful urination) Frequency (urinating often) Urgency (strong desire to urinate)  These symptoms are common especially if medicine is instilled into the bladder or a ureteral stent is placed. Avoiding alcohol and caffeine, such as coffee, tea, and chocolate, may help relieve these symptoms. Drink plenty of water , unless otherwise instructed. Your doctor may also prescribe an antibiotic or other medicine to reduce these symptoms.  Cystoscopy results are available soon after the procedure; biopsy results usually take two to four days. Your doctor will discuss the results of your exam with you. Before you go home, you will be given specific instructions for follow-up care. Special Instructions:   If you are going home with a catheter in place do not take a tub bath until removed by your doctor.   You may resume your  normal activities.   Do not drive or operate machinery if you are taking narcotic pain medicine.   Be sure to keep all follow-up appointments with your doctor.   Call Your Doctor If: The catheter is not draining You have severe pain You are unable to urinate You have a fever over 101 You have severe bleeding

## 2024-10-17 NOTE — Interval H&P Note (Signed)
 History and Physical Interval Note:  10/17/2024 7:11 AM  [name] Fields  has presented today for surgery, with the diagnosis of Elevated Prostate Specific Antigen, Gross Hematuria.  The various methods of treatment have been discussed with the patient and family. After consideration of risks, benefits and other options for treatment, the patient has consented to  Procedures: BIOPSY, PROSTATE, RECTAL APPROACH, WITH US  GUIDANCE (N/A) CYSTOSCOPY (N/A) CYSTOSCOPY, WITH RETROGRADE PYELOGRAM (Bilateral) CYSTOSCOPY, WITH BIOPSY (N/A) TURBT (TRANSURETHRAL RESECTION OF BLADDER TUMOR) (N/A) as a surgical intervention.  The patient's history has been reviewed, patient examined, no change in status, stable for surgery.  I have reviewed the patient's chart and labs.  Questions were answered to the patient's satisfaction.    CV: RRR Lungs: Clear  [name] C [name]

## 2024-10-17 NOTE — Anesthesia Postprocedure Evaluation (Signed)
 Anesthesia Post Note  Patient: [name] Fields  Procedure(s) Performed: BIOPSY, PROSTATE, RECTAL APPROACH, WITH US  GUIDANCE CYSTOSCOPY  Patient location during evaluation: PACU Anesthesia Type: General Level of consciousness: awake and alert Pain management: pain level controlled Vital Signs Assessment: post-procedure vital signs reviewed and stable Respiratory status: spontaneous breathing, nonlabored ventilation, respiratory function stable and patient connected to nasal cannula oxygen Cardiovascular status: blood pressure returned to baseline and stable Postop Assessment: no apparent nausea or vomiting Anesthetic complications: no   No notable events documented.   Last Vitals:  Vitals:   10/17/24 0845 10/17/24 0900  BP: 110/64 126/72  Pulse: 63 62  Resp: 13   Temp: (!) 36.1 C   SpO2: 96% 100%    Last Pain:  Vitals:   10/17/24 0900  TempSrc:   PainSc: 0-No pain                 Debby Mines

## 2024-10-17 NOTE — Op Note (Incomplete)
° °  Preoperative diagnosis:  Elevated PSA Gross hematuria  Postoperative diagnosis:  Elevated PSA Gross hematuria  Procedure: Transrectal ultrasound prostate Transrectal prostate biopsies Cystoscopy  Surgeon: Glendia JAYSON Barba, MD  Anesthesia: General/LMA  Complications: None  Intraoperative findings:  Prostate dimensions: 5.72 x 4.44 X 6.2 cm Prostate volume: 82.5 cc Enlarged transition zone; no peripheral zone abnormalities Cystoscopy: Urethra normal in color without stricture.  Prominent lateral lobe enlargement with hypervascularity, small intravesical median lobe.  Bladder mucosa without erythema, solid or papillary lesions.  UOs normal-appearing bilaterally.  EBL: Minimal  Specimens: Standard 12 core biopsies  Indication: [name] Fields is a 63 y.o. patient with a persistently elevated PSA in the upper 8 range.  Has a pacemaker/defibrillator and unable to have prostate MRI.  Recent episode gross hematuria and he presents for cystoscopy and prostate biopsy under anesthesia.  After reviewing the management options for treatment, he elected to proceed with the above surgical procedure(s). We have discussed the potential benefits and risks of the procedure, side effects of the proposed treatment, the likelihood of the patient achieving the goals of the procedure, and any potential problems that might occur during the procedure or recuperation. Informed consent has been obtained.  Description of procedure:  The patient was taken to the operating room and general anesthesia was induced.  The patient was placed in the dorsal lithotomy position and preoperative antibiotics were administered. A preoperative time-out was performed.  DRE was performed and prostate was enlarged without nodules or induration.  A transrectal ultrasound probe with needle guide was lubricated and gently inserted per rectum.  Prostate ultrasound was performed and transverse and sagittal planes with findings as  described above.  Standard 12 core biopsies were then performed under ultrasound guidance.  No significant bleeding was noted.   He was then prepped and draped.  A 21 French cystoscope with 30 degree lens was lubricated, placed per urethra and advanced into the bladder under direct vision.  Panendoscopy then performed with findings as described above.  Recent CT urogram was normal and with negative cystoscopic findings it was elected not to perform bilateral retrograde pyelogram.  The bladder was emptied and the cystoscope was removed.  After anesthetic reversal he was then transferred to the PACU in stable condition.  Plan: He will be contacted with the biopsy results   Glendia JAYSON Barba, M.D.

## 2024-10-17 NOTE — Transfer of Care (Signed)
 Immediate Anesthesia Transfer of Care Note  Patient: [name] Fields  Procedure(s) Performed: BIOPSY, PROSTATE, RECTAL APPROACH, WITH US  GUIDANCE CYSTOSCOPY CYSTOSCOPY, WITH RETROGRADE PYELOGRAM (Bilateral) CYSTOSCOPY, WITH BIOPSY TURBT (TRANSURETHRAL RESECTION OF BLADDER TUMOR)  Patient Location: PACU  Anesthesia Type:General  Level of Consciousness: drowsy and patient cooperative  Airway & Oxygen Therapy: Patient Spontanous Breathing and Patient connected to face mask oxygen  Post-op Assessment: Report given to RN and Post -op Vital signs reviewed and stable  Post vital signs: Reviewed and stable  Last Vitals:  Vitals Value Taken Time  BP 101/67 10/17/24 08:07  Temp    Pulse 71 10/17/24 08:08  Resp 18 10/17/24 08:08  SpO2 96 % 10/17/24 08:08  Vitals shown include unfiled device data.  Last Pain:  Vitals:   10/17/24 0625  TempSrc: Temporal  PainSc: 0-No pain         Complications: No notable events documented.

## 2024-10-17 NOTE — Anesthesia Preprocedure Evaluation (Signed)
 Anesthesia Evaluation  Patient identified by MRN, date of birth, ID band Patient awake    Reviewed: Allergy & Precautions, NPO status , Patient's Chart, lab work & pertinent test results  Airway Mallampati: II  TM Distance: >3 FB Neck ROM: Full    Dental  (+) Dental Advisory Given   Pulmonary neg pulmonary ROS, sleep apnea , Current Smoker and Patient abstained from smoking.   Pulmonary exam normal breath sounds clear to auscultation       Cardiovascular hypertension, + CAD  negative cardio ROS Normal cardiovascular exam+ dysrhythmias + pacemaker  Rhythm:Regular Rate:Normal  NM Stress 04/2020 Nuclear stress EF: 52%. The left ventricular ejection fraction is mildly decreased (45-54%). There was no ST segment deviation noted during stress. The study is normal. This is a low risk study.  Pacer interrogation 81%AS VS   Neuro/Psych negative neurological ROS  negative psych ROS   GI/Hepatic negative GI ROS, Neg liver ROS, hiatal hernia,GERD  ,,  Endo/Other  negative endocrine ROS    Renal/GU      Musculoskeletal   Abdominal   Peds  Hematology negative hematology ROS (+)   Anesthesia Other Findings Note per Dorise Pereyra:   [name] Fields is a 63 y.o. male who is submitted for pre-surgical anesthesia review and clearance prior to him undergoing the above procedure. Patient is a Current Smoker (24 pack years). Pertinent PMH includes: CAD, NICM, diastolic dysfunction, WPW (s/p RFCA), high-grade second-degree AV block (s/p PPM), atrial tachycardia, atrial flutter, NSVT, atypical chest pain, HTN, HLD, OSAH (on nocturnal PAP therapy), GERD (no daily Tx), hiatal hernia, cervical DDD (s/p anterior and posterior C3-C6 fusion), lumbar DDD.   Patient is followed by cardiology Robyne, MD). He was last seen in the cardiology clinic on 10/08/2024; notes reviewed. At the time of his clinic visit, patient doing well overall from a  cardiovascular perspective. Patient denied any chest pain, shortness of breath, PND, orthopnea, palpitations, significant peripheral edema, weakness, fatigue, vertiginous symptoms, or presyncope/syncope. Patient with a past medical history significant for cardiovascular diagnoses. Documented physical exam was grossly benign, providing no evidence of acute exacerbation and/or decompensation of the patient's known cardiovascular conditions.    Patient with a history of Wolff-Parkinson-White syndrome.  He underwent RFCA procedure (ablation) in approximately 1999.    Patient with a history of high-grade second-degree AV block.  He underwent dual-chamber pacemaker placement on 05/18/1998.  Pulse generator was changed out in 04/2010 and on 07/07/2021.  Patient has an St[employer] Jude Assurity DR RF MRI device in place that is regularly interrogated by his primary electrophysiology team.  Most recent interrogation was on 10/08/2024, at which time device was noted to be functioning properly.    Most recent myocardial perfusion imaging study was performed on 05/28/2020 revealing a reduced left ventricular systolic function with an EF of 45-54%.  There were no regional wall motion abnormalities.  No artifact or left ventricular cavity size enlargement appreciated on review of imaging. SPECT images demonstrated no evidence of stress-induced myocardial ischemia or arrhythmia; no scintigraphic evidence of scar.  TID ratio = 0.96 (normal range </= 1.2). Study determined to be normal and low risk.    Most recent TTE performed on 04/01/2021 revealed a normal left ventricular systolic function with an EF of 60-65%. There was no significant LVH.  There were no regional wall motion abnormalities. Left ventricular diastolic Doppler parameters consistent with abnormal relaxation (G1DD). Right ventricular size and function normal with a TAPSE measuring 1.8 cm  (normal range >/=  1.6 cm).  AR jet inadequate for assessing PA pressures.   Mild calcification of the aortic valve was not visualized.  There was trivial tricuspid valve regurgitation.  All transvalvular gradients were noted to be normal providing no evidence of hemodynamically significant valvular stenosis. Aorta normal in size with no evidence of ectasia or aneurysmal dilatation.    Coronary CTA was performed on 02/16/2024 that demonstrated an Agatston coronary artery calcium score of 71. This placed patient in the 57th percentile for age, sex, and race matched controls. Calcium depositions noted to be isolated mainly in the proximal RCA, proximal LM, mid LAD, and mid LCx distributions.  Total plaque volume is 304 mm acute (calcified 43 mm; noncalcified 261 mm acute) study demonstrated normal coronary origin with RIGHT dominance.  Pulmonary artery and aorta noted to be of normal size/caliber with no evidence of ectasia.   Blood pressure reasonably controlled at 142/70 mmHg on currently prescribed diuretic (HCTZ) monotherapy. Patient is not on any type of lipid-lowering therapies for his HLD diagnosis and further ASCVD prevention. Patient is not diabetic.  Patient does have an OSAH diagnosis and is reported to be sometimes compliant with prescribed nocturnal PAP therapy. Patient is able to complete all of his  ADL/IADLs without cardiovascular limitation.  Per the DASI, patient is able to achieve at least 4 METS of physical activity without experiencing any significant degree of angina/anginal equivalent symptoms. No changes were made to his medication regimen during his visit with cardiology.  Patient scheduled to follow-up with outpatient cardiology in 6 months or sooner if needed.   [name] Fields is scheduled for an elective BIOPSY, PROSTATE, RECTAL APPROACH, WITH US  GUIDANCE; CYSTOSCOPY; CYSTOSCOPY, WITH RETROGRADE PYELOGRAM (Bilateral); CYSTOSCOPY, WITH BIOPSY; TURBT (TRANSURETHRAL RESECTION OF BLADDER TUMOR) on 10/17/2024 with Dr. Glendia JAYSON Barba, MD. Given patient's past  medical history significant for cardiovascular diagnoses, presurgical cardiac clearance was sought by the PAT team. Per cardiology, EKG stable without significant change from prior. No symptoms of CP, or exertional limitations were reported. Described himself as active, busy without limitations. Urological procedure not considered a high cardiac risk procedure. RCRI score is zero (0.4%), low risk. No particular pacemaker management for procedure below the umbilicus needed.     Past Medical History: No date: Atrial flutter (HCC) No date: Atrial tachycardia No date: Atypical chest pain No date: CAD (coronary artery disease) No date: Cardiomyopathy, nonischemic (HCC) No date: DDD (degenerative disc disease), cervical     Comment:  a.) s/p anterior and posterior C3-C6 fusion No date: DDD (degenerative disc disease), lumbar No date: Diastolic dysfunction No date: Elevated PSA No date: GERD (gastroesophageal reflux disease) No date: Hiatal hernia  : High-grade second-degree heart block     Comment:  a.) s/p PPM placement 04/1998 No date: HLD (hyperlipidemia) No date: Hypertension No date: NSVT (nonsustained ventricular tachycardia) (HCC) No date: OSA on CPAP 05/18/1998: Presence of permanent cardiac pacemaker     Comment:  a.) dual chamber PPM placed 05/18/1998 for HG 2nd degree              AVB; b.) pulse generator changed 04/2010; c.) pulse               generator changed 07/07/2021 (Assurity DR RF MRI) No date: Pulmonary nodule seen on imaging study No date: WPW (Wolff-Parkinson-White syndrome)     Comment:  a.) s/p RFCA ~ 1999  Past Surgical History: 2011: CARDIAC DEFIBRILLATOR PLACEMENT     Comment:  St Jude -- [employer] RF 08/18/2020:  CARPAL TUNNEL RELEASE; Left     Comment:  Procedure: CARPAL TUNNEL RELEASE;  Surgeon: Murrell Kuba,              MD;  Location: [facility];  Service:               Orthopedics;  Laterality: Left;  IV REGIONAL FOREARM                BLOCK 12/01/2020: CARPAL TUNNEL RELEASE; Right     Comment:  Procedure: CARPAL TUNNEL RELEASE;  Surgeon: Murrell Kuba,              MD;  Location: [facility];  Service:               Orthopedics;  Laterality: Right;  IV REGIONAL FOREARM               BLOCK 1999: PACEMAKER INSERTION     Comment:  explanted 2011 07/07/2021: PPM GENERATOR CHANGEOUT; N/A     Comment:  Procedure: PPM GENERATOR CHANGEOUT;  Surgeon: Fernande Elspeth BROCKS, MD;  Location: [facility];  Service:               Cardiovascular;  Laterality: N/A;     Reproductive/Obstetrics negative OB ROS                              Anesthesia Physical Anesthesia Plan  ASA: 2  Anesthesia Plan:    Post-op Pain Management:    Induction: Intravenous  PONV Risk Score and Plan: 2 and Ondansetron , Dexamethasone  and Midazolam   Airway Management Planned: LMA  Additional Equipment:   Intra-op Plan:   Post-operative Plan: Extubation in OR  Informed Consent: I have reviewed the patients History and Physical, chart, labs and discussed the procedure including the risks, benefits and alternatives for the proposed anesthesia with the patient or authorized representative who has indicated his/her understanding and acceptance.     Dental Advisory Given  Plan Discussed with: Anesthesiologist, CRNA and Surgeon  Anesthesia Plan Comments: (Patient consented for risks of anesthesia including but not limited to:  - adverse reactions to medications - damage to eyes, teeth, lips or other oral mucosa - nerve damage due to positioning  - sore throat or hoarseness - Damage to heart, brain, nerves, lungs, other parts of body or loss of life  Patient voiced understanding and assent.)        Anesthesia Quick Evaluation

## 2024-10-17 NOTE — Anesthesia Procedure Notes (Signed)
 Procedure Name: Intubation Date/Time: 10/17/2024 7:35 AM  Performed by: Ledora Duncan, CRNAPre-anesthesia Checklist: Patient identified, Emergency Drugs available, Suction available and Patient being monitored Patient Re-evaluated:Patient Re-evaluated prior to induction Oxygen Delivery Method: Circle system utilized Preoxygenation: Pre-oxygenation with 100% oxygen Induction Type: IV induction Ventilation: Mask ventilation without difficulty Laryngoscope Size: McGrath and 3 Grade View: Grade I Tube type: Oral Tube size: 7.0 mm Number of attempts: 1 Airway Equipment and Method: Stylet Placement Confirmation: ETT inserted through vocal cords under direct vision, positive ETCO2 and breath sounds checked- equal and bilateral Secured at: 21 cm Tube secured with: Tape Dental Injury: Teeth and Oropharynx as per pre-operative assessment

## 2024-10-17 NOTE — H&P (Signed)
 Urology H&P   Assessment/Recommendations: 63 y.o. male with history of gross hematuria and an elevated PSA.  Presents for cystoscopy and prostate biopsy.  The procedures have been discussed in detail including potential risks of bleeding, infection/sepsis.  All questions were answered and he desires to proceed.    History of Present Illness: [name] Fields is a 63 y.o. recently seen for an episode of gross hematuria.  UA showed persistent microhematuria.  CT urogram showed no upper tract abnormalities  PSA was 8.07 February 2024 and a repeat PSA 07/2024 was persistently elevated at 8.7.  % Free PSA was 9.3.  He has a cardiac pacemaker/defibrillator and was unable to have an MRI  Past Medical History:  Diagnosis Date   Atrial flutter (HCC)    Atrial tachycardia    Atypical chest pain    CAD (coronary artery disease)    Cardiomyopathy, nonischemic (HCC)    DDD (degenerative disc disease), cervical    a.) s/p anterior and posterior C3-C6 fusion   DDD (degenerative disc disease), lumbar    Diastolic dysfunction    Elevated PSA    GERD (gastroesophageal reflux disease)    Hiatal hernia    High-grade second-degree heart block     a.) s/p PPM placement 04/1998   HLD (hyperlipidemia)    Hypertension    NSVT (nonsustained ventricular tachycardia) (HCC)    OSA on CPAP    Presence of permanent cardiac pacemaker 05/18/1998   a.) dual chamber PPM placed 05/18/1998 for HG 2nd degree AVB; b.) pulse generator changed 04/2010; c.) pulse generator changed 07/07/2021 (Assurity DR RF MRI)   Pulmonary nodule seen on imaging study    WPW (Wolff-Parkinson-White syndrome)    a.) s/p RFCA ~ 1999    Past Surgical History:  Procedure Laterality Date   CARDIAC DEFIBRILLATOR PLACEMENT  2011   St Jude -- [employer] RF   CARPAL TUNNEL RELEASE Left 08/18/2020   Procedure: CARPAL TUNNEL RELEASE;  Surgeon: Murrell Kuba, MD;  Location: [facility];  Service: Orthopedics;  Laterality: Left;  IV  REGIONAL FOREARM BLOCK   CARPAL TUNNEL RELEASE Right 12/01/2020   Procedure: CARPAL TUNNEL RELEASE;  Surgeon: Murrell Kuba, MD;  Location: [facility];  Service: Orthopedics;  Laterality: Right;  IV REGIONAL FOREARM BLOCK   PACEMAKER INSERTION  1999   explanted 2011   PPM GENERATOR CHANGEOUT N/A 07/07/2021   Procedure: PPM GENERATOR CHANGEOUT;  Surgeon: Fernande Elspeth BROCKS, MD;  Location: [facility];  Service: Cardiovascular;  Laterality: N/A;    Home Medications:  Active Medications[1]  Allergies: Allergies[2]  Family History  Problem Relation Age of Onset   Breast cancer Mother    Heart disease Mother    Heart Problems Mother        pacemaker   Prostate cancer Father    Hypertension Father     Social History:  reports that he has been smoking cigarettes. He started smoking about 47 years ago. He has a 24 pack-year smoking history. He has never used smokeless tobacco. He reports current alcohol use of about 7.0 standard drinks of alcohol per week. He reports that he does not use drugs.  ROS: A complete review of systems was performed.  All systems are negative except for pertinent findings as noted.  Physical Exam:  Vital signs in last 24 hours: Temp:  [97 F (36.1 C)] 97 F (36.1 C) (12/18 0625) Pulse Rate:  [84] 84 (12/18 0625) Resp:  [16] 16 (12/18 0625)  BP: (139)/(86) 139/86 (12/18 0625) SpO2:  [99 %] 99 % (12/18 9374) Constitutional:  Alert and oriented, No acute distress HEENT:  AT Respiratory: Normal respiratory effort Psychiatric: Normal mood and affect   Laboratory Data:  No results for input(s): WBC, HGB, HCT in the last 72 hours. No results for input(s): NA, K, CL, CO2, GLUCOSE, BUN, CREATININE, CALCIUM in the last 72 hours. No results for input(s): LABPT, INR in the last 72 hours. No results for input(s): LABURIN in the last 72 hours. Results for orders placed or performed in visit on 09/30/24  Microscopic  Examination     Status: Abnormal   Collection Time: 09/30/24 12:00 PM   Urine  Result Value Ref Range Status   WBC, UA 0-5 0 - 5 /hpf Final   RBC, Urine 3-10 (A) 0 - 2 /hpf Final   Epithelial Cells (non renal) 0-10 0 - 10 /hpf Final   Bacteria, UA None seen None seen/Few Final  CULTURE, URINE COMPREHENSIVE     Status: None   Collection Time: 09/30/24  3:42 PM   Specimen: Urine   UR  Result Value Ref Range Status   Urine Culture, Comprehensive Final report  Final   Organism ID, Bacteria Comment  Final    Comment: No growth in 36 - 48 hours.     Radiologic Imaging: CT HEMATURIA WORKUP Result Date: 10/16/2024 CLINICAL DATA:  Hematuria.  Is EXAM: CT ABDOMEN AND PELVIS WITHOUT AND WITH CONTRAST TECHNIQUE: Multidetector CT imaging of the abdomen and pelvis was performed following the standard protocol before and following the bolus administration of intravenous contrast. RADIATION DOSE REDUCTION: This exam was performed according to the departmental dose-optimization program which includes automated exposure control, adjustment of the mA and/or kV according to patient size and/or use of iterative reconstruction technique. CONTRAST:  OMNIPAQUE  IOHEXOL  300 MG/ML  SOLN COMPARISON:  CT scan abdomen and pelvis from 07/17/2017. FINDINGS: Lower chest: There are subpleural atelectatic changes in the visualized lung bases. No overt consolidation. No pleural effusion. The heart is normal in size. No pericardial effusion. Is Hepatobiliary: The liver is normal in size. Non-cirrhotic configuration. There are 2, subcentimeter sized ill-defined hypoattenuating foci (series 3, images 12 and 25), which are too small to adequately characterize. No suspicious liver lesion. No intrahepatic or extrahepatic bile duct dilation. No calcified gallstones. Normal gallbladder wall thickness. No pericholecystic inflammatory changes. Pancreas: Unremarkable. No pancreatic ductal dilatation or surrounding inflammatory  changes. Spleen: Within normal limits. No focal lesion. Adrenals/Urinary Tract: Adrenal glands are unremarkable. Non-contrast images: Evaluation of noncontrast images is limited for nonobstructing renal calculi due to presence of excreted contrast in the renal collecting system. No obstructing nephroureterolithiasis seen. Kidneys: Symmetric enhancement. There is a partially exophytic 1.8 x 2.7 cm cyst arising from the right kidney interpolar region, posteriorly. There are several additional smaller cysts in bilateral kidneys. No suspicious renal mass. Urinary Tract Opacification: Adequate. Collecting Systems and Ureters: No filling defects, masses, strictures, or areas of abnormal dilatation. Urinary Bladder: No filling defect, wall thickening, or mass. Stomach/Bowel: No disproportionate dilation of the small or large bowel loops. No evidence of abnormal bowel wall thickening or inflammatory changes. The appendix is unremarkable. Vascular/Lymphatic: No ascites or pneumoperitoneum. No abdominal or pelvic lymphadenopathy, by size criteria. No aneurysmal dilation of the major abdominal arteries. There are mild peripheral atherosclerotic vascular calcifications of the aorta and its major branches. Reproductive: Enlarged prostate. Symmetric seminal vesicles. Bilateral at least small hydroceles noted, incompletely imaged. Other: There is a tiny  fat containing umbilical hernia. There is a small right inguinal hernia containing fat and ascitic fluid. The soft tissues and abdominal wall are otherwise unremarkable. Musculoskeletal: No suspicious osseous lesions. There are mild - moderate multilevel degenerative changes in the visualized spine. Is IMPRESSION: 1. No nephroureterolithiasis or obstructive uropathy. No suspicious renal, ureteric or urinary bladder mass. 2. Multiple other nonacute observations, as described above. Aortic Atherosclerosis (ICD10-I70.0). Electronically Signed   By: Ree Molt M.D.   On: 10/16/2024  15:02       10/17/2024, 7:06 AM  Glendia Barba,  MD        [1]  Current Meds  Medication Sig   Ascorbic Acid (VITAMIN C) 1000 MG tablet Take 1,000 mg by mouth in the morning.   hydrochlorothiazide  (HYDRODIURIL ) 25 MG tablet Take 1 tablet (25 mg total) by mouth daily.   Multiple Vitamin (MULTIVITAMIN WITH MINERALS) TABS tablet Take 1 tablet by mouth daily.   naphazoline-glycerin (CLEAR EYES REDNESS RELIEF) 0.012-0.25 % SOLN Place 2 drops into both eyes 4 (four) times daily as needed for eye irritation.   Vitamin D , Ergocalciferol , (DRISDOL ) 1.25 MG (50000 UNIT) CAPS capsule Take 1 capsule (50,000 Units total) by mouth every 7 (seven) days. (Patient taking differently: Take 50,000 Units by mouth every Monday.)  [2]  Allergies Allergen Reactions   Lisinopril  Swelling and Anaphylaxis    Lip swelling

## 2024-10-18 ENCOUNTER — Ambulatory Visit: Payer: Self-pay | Admitting: Cardiology

## 2024-10-22 ENCOUNTER — Ambulatory Visit: Payer: Self-pay | Admitting: Urology

## 2024-10-22 LAB — SURGICAL PATHOLOGY

## 2024-11-04 ENCOUNTER — Ambulatory Visit: Payer: Self-pay

## 2024-11-04 DIAGNOSIS — I4892 Unspecified atrial flutter: Secondary | ICD-10-CM | POA: Diagnosis not present

## 2024-11-07 ENCOUNTER — Ambulatory Visit: Payer: Self-pay | Admitting: Student in an Organized Health Care Education/Training Program

## 2024-11-07 LAB — CUP PACEART REMOTE DEVICE CHECK
Battery Remaining Longevity: 84 mo
Battery Remaining Percentage: 74 %
Battery Voltage: 3.02 V
Brady Statistic AP VP Percent: 2.9 %
Brady Statistic AP VS Percent: 6.1 %
Brady Statistic AS VP Percent: 10 %
Brady Statistic AS VS Percent: 81 %
Brady Statistic RA Percent Paced: 8.4 %
Brady Statistic RV Percent Paced: 13 %
Date Time Interrogation Session: 20260108130020
Implantable Lead Connection Status: 753985
Implantable Lead Connection Status: 753985
Implantable Lead Implant Date: 19990719
Implantable Lead Implant Date: 19990719
Implantable Lead Location: 753859
Implantable Lead Location: 753860
Implantable Pulse Generator Implant Date: 20220907
Lead Channel Impedance Value: 390 Ohm
Lead Channel Impedance Value: 410 Ohm
Lead Channel Pacing Threshold Amplitude: 0.5 V
Lead Channel Pacing Threshold Amplitude: 0.75 V
Lead Channel Pacing Threshold Pulse Width: 0.5 ms
Lead Channel Pacing Threshold Pulse Width: 0.5 ms
Lead Channel Sensing Intrinsic Amplitude: 11.1 mV
Lead Channel Sensing Intrinsic Amplitude: 5 mV
Lead Channel Setting Pacing Amplitude: 2 V
Lead Channel Setting Pacing Amplitude: 2.5 V
Lead Channel Setting Pacing Pulse Width: 0.5 ms
Lead Channel Setting Sensing Sensitivity: 2 mV
Pulse Gen Model: 2272
Pulse Gen Serial Number: 6519619

## 2024-11-07 NOTE — Progress Notes (Signed)
 Remote PPM Transmission

## 2024-11-14 ENCOUNTER — Encounter: Payer: Self-pay | Admitting: Gastroenterology

## 2024-12-04 ENCOUNTER — Encounter

## 2024-12-05 ENCOUNTER — Telehealth: Payer: Self-pay | Admitting: *Deleted

## 2024-12-05 ENCOUNTER — Ambulatory Visit

## 2024-12-05 VITALS — Ht 73.0 in | Wt 226.0 lb

## 2024-12-05 DIAGNOSIS — Z860100 Personal history of colon polyps, unspecified: Secondary | ICD-10-CM

## 2024-12-05 MED ORDER — NA SULFATE-K SULFATE-MG SULF 17.5-3.13-1.6 GM/177ML PO SOLN: 1.0000 | Freq: Once | ORAL | 0 refills | Status: AC

## 2024-12-05 NOTE — Progress Notes (Signed)
 RN confirmed patient name, date of birth, and address RN confirmed date and time of procedure RN reviewed and confirmed allergies  RN reviewed and updated current medications; confirmed preferred pharmacy Pt is not on diet pills nor GLP-1 medications Pt is not on blood thinners RN reviewed medical & surgical hx  Pt denies issues with chronic constipation  Diabetic - no No A fib or A flutter No cardiac tests are pending  Pt is not on home 02  No issues known with past sedation with any surgeries or procedures Patient denies ever being told they had issues or difficulty with intubation  Patient unaware of any fh of malignant hyperthermia Ambulates independently RN reviewed prep instructions and explained time frames for holding certain medications RN answered patient questions; patient stated understanding Prep instructions printed and reviewed with patient

## 2024-12-18 ENCOUNTER — Encounter: Admitting: Gastroenterology

## 2025-02-17 ENCOUNTER — Encounter: Payer: PRIVATE HEALTH INSURANCE | Admitting: Nurse Practitioner

## 2025-04-22 ENCOUNTER — Other Ambulatory Visit

## 2025-04-25 ENCOUNTER — Ambulatory Visit: Admitting: Urology
